# Patient Record
Sex: Female | Born: 1975 | Race: Black or African American | Hispanic: No | Smoking: Never smoker
Health system: Southern US, Community
[De-identification: ages and names within clinical notes are randomized; demographics above are authoritative.]

## PROBLEM LIST (undated history)

## (undated) DIAGNOSIS — N63 Unspecified lump in unspecified breast: Secondary | ICD-10-CM

## (undated) DIAGNOSIS — L309 Dermatitis, unspecified: Secondary | ICD-10-CM

## (undated) DIAGNOSIS — I10 Essential (primary) hypertension: Secondary | ICD-10-CM

## (undated) DIAGNOSIS — J45909 Unspecified asthma, uncomplicated: Secondary | ICD-10-CM

## (undated) HISTORY — DX: Unspecified asthma, uncomplicated: J45.909

## (undated) HISTORY — DX: Dermatitis, unspecified: L30.9

---
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
## 1999-01-06 ENCOUNTER — Other Ambulatory Visit: Admission: RE | Admit: 1999-01-06 | Discharge: 1999-01-06 | Payer: Self-pay | Admitting: Gynecology

## 1999-07-13 ENCOUNTER — Inpatient Hospital Stay (HOSPITAL_COMMUNITY): Admission: AD | Admit: 1999-07-13 | Discharge: 1999-07-16 | Payer: Self-pay | Admitting: Gynecology

## 1999-09-02 ENCOUNTER — Other Ambulatory Visit: Admission: RE | Admit: 1999-09-02 | Discharge: 1999-09-02 | Payer: Self-pay | Admitting: Gynecology

## 2000-07-05 ENCOUNTER — Encounter: Payer: Self-pay | Admitting: Internal Medicine

## 2000-07-05 ENCOUNTER — Ambulatory Visit (HOSPITAL_COMMUNITY): Admission: RE | Admit: 2000-07-05 | Discharge: 2000-07-05 | Payer: Self-pay | Admitting: Internal Medicine

## 2000-09-05 ENCOUNTER — Other Ambulatory Visit: Admission: RE | Admit: 2000-09-05 | Discharge: 2000-09-05 | Payer: Self-pay | Admitting: Gynecology

## 2001-01-24 ENCOUNTER — Encounter: Payer: Self-pay | Admitting: Internal Medicine

## 2001-01-24 ENCOUNTER — Encounter: Admission: RE | Admit: 2001-01-24 | Discharge: 2001-01-24 | Payer: Self-pay | Admitting: Internal Medicine

## 2001-07-23 ENCOUNTER — Encounter: Payer: Self-pay | Admitting: Internal Medicine

## 2001-07-23 ENCOUNTER — Encounter: Admission: RE | Admit: 2001-07-23 | Discharge: 2001-07-23 | Payer: Self-pay | Admitting: Internal Medicine

## 2001-10-01 ENCOUNTER — Other Ambulatory Visit: Admission: RE | Admit: 2001-10-01 | Discharge: 2001-10-01 | Payer: Self-pay | Admitting: Internal Medicine

## 2002-06-25 ENCOUNTER — Encounter: Payer: Self-pay | Admitting: Emergency Medicine

## 2002-06-25 ENCOUNTER — Emergency Department (HOSPITAL_COMMUNITY): Admission: EM | Admit: 2002-06-25 | Discharge: 2002-06-25 | Payer: Self-pay | Admitting: Emergency Medicine

## 2004-02-10 ENCOUNTER — Other Ambulatory Visit: Admission: RE | Admit: 2004-02-10 | Discharge: 2004-02-10 | Payer: Self-pay | Admitting: Obstetrics and Gynecology

## 2004-02-19 ENCOUNTER — Encounter: Admission: RE | Admit: 2004-02-19 | Discharge: 2004-02-19 | Payer: Self-pay | Admitting: Obstetrics and Gynecology

## 2004-04-26 ENCOUNTER — Ambulatory Visit (HOSPITAL_COMMUNITY): Admission: RE | Admit: 2004-04-26 | Discharge: 2004-04-26 | Payer: Self-pay | Admitting: Internal Medicine

## 2010-10-18 HISTORY — PX: UTERINE FIBROID SURGERY: SHX826

## 2015-08-05 ENCOUNTER — Emergency Department (HOSPITAL_COMMUNITY): Payer: Medicaid Other

## 2015-08-05 ENCOUNTER — Encounter (HOSPITAL_COMMUNITY): Payer: Self-pay | Admitting: Emergency Medicine

## 2015-08-05 ENCOUNTER — Emergency Department (HOSPITAL_COMMUNITY)
Admission: EM | Admit: 2015-08-05 | Discharge: 2015-08-06 | Disposition: A | Discharge status: Home / Self Care | Payer: Medicaid Other | Attending: Emergency Medicine | Admitting: Emergency Medicine

## 2015-08-05 DIAGNOSIS — R1012 Left upper quadrant pain: Secondary | ICD-10-CM | POA: Diagnosis not present

## 2015-08-05 DIAGNOSIS — S301XXA Contusion of abdominal wall, initial encounter: Secondary | ICD-10-CM | POA: Diagnosis not present

## 2015-08-05 DIAGNOSIS — I10 Essential (primary) hypertension: Secondary | ICD-10-CM | POA: Insufficient documentation

## 2015-08-05 DIAGNOSIS — Z79899 Other long term (current) drug therapy: Secondary | ICD-10-CM | POA: Diagnosis not present

## 2015-08-05 DIAGNOSIS — S161XXA Strain of muscle, fascia and tendon at neck level, initial encounter: Secondary | ICD-10-CM | POA: Diagnosis not present

## 2015-08-05 DIAGNOSIS — Y999 Unspecified external cause status: Secondary | ICD-10-CM | POA: Diagnosis not present

## 2015-08-05 DIAGNOSIS — S5002XA Contusion of left elbow, initial encounter: Secondary | ICD-10-CM | POA: Insufficient documentation

## 2015-08-05 DIAGNOSIS — Z91040 Latex allergy status: Secondary | ICD-10-CM | POA: Diagnosis not present

## 2015-08-05 DIAGNOSIS — S199XXA Unspecified injury of neck, initial encounter: Secondary | ICD-10-CM | POA: Diagnosis present

## 2015-08-05 DIAGNOSIS — Y939 Activity, unspecified: Secondary | ICD-10-CM | POA: Insufficient documentation

## 2015-08-05 DIAGNOSIS — Y92410 Unspecified street and highway as the place of occurrence of the external cause: Secondary | ICD-10-CM | POA: Diagnosis not present

## 2015-08-05 HISTORY — DX: Essential (primary) hypertension: I10

## 2015-08-05 LAB — I-STAT CHEM 8, ED
BUN: 4 mg/dL — ABNORMAL LOW (ref 6–20)
Calcium, Ion: 1.21 mmol/L (ref 1.13–1.30)
Chloride: 101 mmol/L (ref 101–111)
Creatinine, Ser: 0.8 mg/dL (ref 0.44–1.00)
Glucose, Bld: 104 mg/dL — ABNORMAL HIGH (ref 65–99)
HCT: 44 % (ref 36.0–46.0)
Hemoglobin: 15 g/dL (ref 12.0–15.0)
Potassium: 3.6 mmol/L (ref 3.5–5.1)
Sodium: 141 mmol/L (ref 135–145)
TCO2: 27 mmol/L (ref 0–100)

## 2015-08-05 LAB — COMPREHENSIVE METABOLIC PANEL
ALT: 22 U/L (ref 14–54)
AST: 24 U/L (ref 15–41)
Albumin: 3.8 g/dL (ref 3.5–5.0)
Alkaline Phosphatase: 48 U/L (ref 38–126)
Anion gap: 5 (ref 5–15)
BUN: 5 mg/dL — ABNORMAL LOW (ref 6–20)
CO2: 28 mmol/L (ref 22–32)
Calcium: 9.8 mg/dL (ref 8.9–10.3)
Chloride: 105 mmol/L (ref 101–111)
Creatinine, Ser: 0.83 mg/dL (ref 0.44–1.00)
GFR calc Af Amer: >60 mL/min (ref >60)
GFR calc non Af Amer: >60 mL/min (ref >60)
Glucose, Bld: 109 mg/dL — ABNORMAL HIGH (ref 65–99)
Potassium: 3.6 mmol/L (ref 3.5–5.1)
Sodium: 138 mmol/L (ref 135–145)
Total Bilirubin: 0.7 mg/dL (ref 0.3–1.2)
Total Protein: 7.2 g/dL (ref 6.5–8.1)

## 2015-08-05 LAB — CBC WITH DIFFERENTIAL/PLATELET
Basophils Absolute: 0 10*3/uL (ref 0.0–0.1)
Basophils Relative: 0 %
Eosinophils Absolute: 0.1 10*3/uL (ref 0.0–0.7)
Eosinophils Relative: 1 %
HCT: 41.7 % (ref 36.0–46.0)
Hemoglobin: 13.9 g/dL (ref 12.0–15.0)
Lymphocytes Relative: 29 %
Lymphs Abs: 2.9 10*3/uL (ref 0.7–4.0)
MCH: 28.7 pg (ref 26.0–34.0)
MCHC: 33.3 g/dL (ref 30.0–36.0)
MCV: 86 fL (ref 78.0–100.0)
Monocytes Absolute: 0.8 10*3/uL (ref 0.1–1.0)
Monocytes Relative: 8 %
Neutro Abs: 6.3 10*3/uL (ref 1.7–7.7)
Neutrophils Relative %: 62 %
Platelets: 316 10*3/uL (ref 150–400)
RBC: 4.85 MIL/uL (ref 3.87–5.11)
RDW: 12.7 % (ref 11.5–15.5)
WBC: 10.1 10*3/uL (ref 4.0–10.5)
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
## 2015-08-05 NOTE — ED Notes (Signed)
Phlebotomy at bedside.

## 2015-08-06 ENCOUNTER — Emergency Department (HOSPITAL_COMMUNITY): Payer: Medicaid Other

## 2015-08-06 ENCOUNTER — Encounter (HOSPITAL_COMMUNITY): Payer: Self-pay | Admitting: Radiology

## 2015-08-06 MED ORDER — CYCLOBENZAPRINE HCL 10 MG PO TABS: 10.0000 mg | ORAL_TABLET | Freq: Two times a day (BID) | ORAL | Status: DC | PRN

## 2015-08-06 MED ORDER — FENTANYL CITRATE (PF) 100 MCG/2ML IJ SOLN
25.0000 ug | Freq: Once | INTRAMUSCULAR | Status: AC
  Administered 2015-08-06: 25 ug via INTRAVENOUS
  Filled 2015-08-06: qty 2

## 2015-08-06 MED ORDER — IBUPROFEN 800 MG PO TABS: 800.0000 mg | ORAL_TABLET | Freq: Three times a day (TID) | ORAL | Status: DC

## 2015-08-06 MED ORDER — TRAMADOL HCL 50 MG PO TABS: 50.0000 mg | ORAL_TABLET | Freq: Two times a day (BID) | ORAL | Status: DC | PRN

## 2015-08-06 NOTE — ED Notes (Signed)
Pt ambulatory to bathroom without any problems 
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
## 2015-08-06 NOTE — ED Notes (Signed)
Patient transported to CT 
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
## 2015-08-07 MED ORDER — IOPAMIDOL (ISOVUE-300) INJECTION 61%
100.0000 mL | Freq: Once | INTRAVENOUS | Status: AC | PRN
  Administered 2015-08-06: 100 mL via INTRAVENOUS

## 2015-11-03 ENCOUNTER — Other Ambulatory Visit: Payer: Self-pay | Admitting: Nurse Practitioner

## 2015-11-03 DIAGNOSIS — Z1231 Encounter for screening mammogram for malignant neoplasm of breast: Secondary | ICD-10-CM

## 2015-12-23 ENCOUNTER — Ambulatory Visit: Payer: Medicaid Other

## 2016-07-21 ENCOUNTER — Encounter (HOSPITAL_COMMUNITY): Payer: Self-pay | Admitting: Emergency Medicine

## 2016-07-21 DIAGNOSIS — R102 Pelvic and perineal pain: Secondary | ICD-10-CM | POA: Diagnosis not present

## 2016-07-21 DIAGNOSIS — Z79899 Other long term (current) drug therapy: Secondary | ICD-10-CM | POA: Insufficient documentation

## 2016-07-21 DIAGNOSIS — I10 Essential (primary) hypertension: Secondary | ICD-10-CM | POA: Diagnosis not present

## 2016-07-21 DIAGNOSIS — R1032 Left lower quadrant pain: Secondary | ICD-10-CM | POA: Diagnosis present

## 2016-07-21 DIAGNOSIS — Z91040 Latex allergy status: Secondary | ICD-10-CM | POA: Insufficient documentation

## 2016-07-21 DIAGNOSIS — N72 Inflammatory disease of cervix uteri: Secondary | ICD-10-CM | POA: Insufficient documentation

## 2016-07-21 LAB — COMPREHENSIVE METABOLIC PANEL
ALT: 67 U/L — ABNORMAL HIGH (ref 14–54)
AST: 43 U/L — ABNORMAL HIGH (ref 15–41)
Albumin: 3.6 g/dL (ref 3.5–5.0)
Alkaline Phosphatase: 55 U/L (ref 38–126)
Anion gap: 6 (ref 5–15)
BUN: 10 mg/dL (ref 6–20)
CO2: 24 mmol/L (ref 22–32)
Calcium: 9.5 mg/dL (ref 8.9–10.3)
Chloride: 106 mmol/L (ref 101–111)
Creatinine, Ser: 0.87 mg/dL (ref 0.44–1.00)
GFR calc Af Amer: >60 mL/min (ref >60)
GFR calc non Af Amer: >60 mL/min (ref >60)
Glucose, Bld: 103 mg/dL — ABNORMAL HIGH (ref 65–99)
Potassium: 3.8 mmol/L (ref 3.5–5.1)
Sodium: 136 mmol/L (ref 135–145)
Total Bilirubin: 0.4 mg/dL (ref 0.3–1.2)
Total Protein: 6.3 g/dL — ABNORMAL LOW (ref 6.5–8.1)

## 2016-07-21 LAB — URINALYSIS, ROUTINE W REFLEX MICROSCOPIC
Bilirubin Urine: NEGATIVE
Glucose, UA: NEGATIVE mg/dL
Hgb urine dipstick: NEGATIVE
Ketones, ur: NEGATIVE mg/dL
Leukocytes, UA: NEGATIVE
Nitrite: NEGATIVE
Protein, ur: NEGATIVE mg/dL
RBC / HPF: NONE SEEN RBC/hpf (ref 0–5)
Specific Gravity, Urine: 1.014 (ref 1.005–1.030)
pH: 8 (ref 5.0–8.0)

## 2016-07-21 LAB — CBC
HCT: 39.3 % (ref 36.0–46.0)
Hemoglobin: 13 g/dL (ref 12.0–15.0)
MCH: 28.4 pg (ref 26.0–34.0)
MCHC: 33.1 g/dL (ref 30.0–36.0)
MCV: 86 fL (ref 78.0–100.0)
Platelets: 288 10*3/uL (ref 150–400)
RBC: 4.57 MIL/uL (ref 3.87–5.11)
RDW: 13.3 % (ref 11.5–15.5)
WBC: 8.9 10*3/uL (ref 4.0–10.5)

## 2016-07-21 LAB — LIPASE, BLOOD: Lipase: 31 U/L (ref 11–51)

## 2016-07-21 NOTE — ED Triage Notes (Signed)
Patient reports intermittent LLQ pain onset this evening , denies emesis or diarrhea , no fever or chills .

## 2016-07-22 ENCOUNTER — Emergency Department (HOSPITAL_COMMUNITY): Payer: Medicaid Other

## 2016-07-22 ENCOUNTER — Emergency Department (HOSPITAL_COMMUNITY)
Admission: EM | Admit: 2016-07-22 | Discharge: 2016-07-22 | Disposition: A | Discharge status: Home / Self Care | Payer: Medicaid Other | Attending: Emergency Medicine | Admitting: Emergency Medicine

## 2016-07-22 DIAGNOSIS — N72 Inflammatory disease of cervix uteri: Secondary | ICD-10-CM

## 2016-07-22 DIAGNOSIS — R102 Pelvic and perineal pain: Secondary | ICD-10-CM

## 2016-07-22 LAB — GC/CHLAMYDIA PROBE AMP (~~LOC~~) NOT AT ARMC
Chlamydia: NEGATIVE
Neisseria Gonorrhea: NEGATIVE

## 2016-07-22 LAB — WET PREP, GENITAL
Sperm: NONE SEEN
Trich, Wet Prep: NONE SEEN
Yeast Wet Prep HPF POC: NONE SEEN

## 2016-07-22 LAB — PREGNANCY, URINE: Preg Test, Ur: NEGATIVE

## 2016-07-22 MED ORDER — ONDANSETRON HCL 4 MG/2ML IJ SOLN
4.0000 mg | Freq: Once | INTRAMUSCULAR | Status: AC
  Administered 2016-07-22: 4 mg via INTRAVENOUS
  Filled 2016-07-22: qty 2

## 2016-07-22 MED ORDER — TRAMADOL HCL 50 MG PO TABS: 50.0000 mg | ORAL_TABLET | Freq: Four times a day (QID) | ORAL | 0 refills | Status: DC | PRN

## 2016-07-22 MED ORDER — LEVOFLOXACIN 500 MG PO TABS: 500.0000 mg | ORAL_TABLET | Freq: Every day | ORAL | 0 refills | Status: DC

## 2016-07-22 MED ORDER — METRONIDAZOLE 500 MG PO TABS: 500.0000 mg | ORAL_TABLET | Freq: Two times a day (BID) | ORAL | 0 refills | Status: DC

## 2016-07-22 MED ORDER — HYDROMORPHONE HCL 1 MG/ML IJ SOLN
1.0000 mg | Freq: Once | INTRAMUSCULAR | Status: AC
  Administered 2016-07-22: 1 mg via INTRAVENOUS
  Filled 2016-07-22: qty 1

## 2016-07-22 NOTE — ED Provider Notes (Signed)
[facility] DEPT Provider Note   CSN: [id number] Arrival date & time: 07/21/16  2113  By signing my name below, I, Ny'Kea Lewis, attest that this documentation has been prepared under the direction and in the presence of Blinda Turek, Gwenyth Allegra, *. Electronically Signed: Lise Auer, ED Scribe. 07/22/16. 5:30 AM.  History   Chief Complaint Chief Complaint  Patient presents with  . Abdominal Pain   The history is provided by the patient. No language interpreter was used.    HPI Comments: Tracy [name] is a 41 y.o. female with a history of hypertension, who presents to the Emergency Department complaining of sudden onset, sharp intermittent gradually worsening, left lower quadrant that began at 6:30 pm last night. She notes associated pain that radiates into the groin area. She reports she was asleep when the pain awoke her up from her sleep. Has a hx of fibroids. Pt reports a PSHx of a uterine ablation. Pt reports she does not have a menstural. She denies symptoms of nausea, emesis, diarrhea, fever, or chills.    Past Medical History:  Diagnosis Date  . Hypertension    There are no active problems to display for this patient.  Past Surgical History:  Procedure Laterality Date  . CESAREAN SECTION    . UTERINE FIBROID SURGERY     OB History    No data available     Home Medications    Prior to Admission medications   Medication Sig Start Date End Date Taking? Authorizing Provider  amLODipine (NORVASC) 10 MG tablet Take 10 mg by mouth daily.   Yes [provider]  cyclobenzaprine (FLEXERIL) 10 MG tablet Take 1 tablet (10 mg total) by mouth 2 (two) times daily as needed for muscle spasms. 08/06/15  Yes Delsa Grana, PA-C  fexofenadine (ALLEGRA) 180 MG tablet Take 180 mg by mouth daily.   Yes [provider]  hydrochlorothiazide (HYDRODIURIL) 25 MG tablet Take 25 mg by mouth daily.   Yes [provider]  ibuprofen (ADVIL,MOTRIN) 800 MG tablet Take 1  tablet (800 mg total) by mouth 3 (three) times daily. Patient taking differently: Take 800 mg by mouth 3 (three) times daily as needed for moderate pain.  08/06/15  Yes Delsa Grana, PA-C  Multiple Vitamin (MULTIVITAMIN WITH MINERALS) TABS tablet Take 1 tablet by mouth daily.   Yes [provider]  naproxen (NAPROSYN) 500 MG tablet Take 500 mg by mouth 2 (two) times daily as needed for mild pain.   Yes [provider]  levofloxacin (LEVAQUIN) 500 MG tablet Take 1 tablet (500 mg total) by mouth daily. 07/22/16   Orpah Greek, MD  metroNIDAZOLE (FLAGYL) 500 MG tablet Take 1 tablet (500 mg total) by mouth 2 (two) times daily. 07/22/16   Orpah Greek, MD  traMADol (ULTRAM) 50 MG tablet Take 1 tablet (50 mg total) by mouth every 6 (six) hours as needed. 07/22/16   Orpah Greek, MD   Family History No family history on file.  Social History Social History  Substance Use Topics  . Smoking status: Never Smoker  . Smokeless tobacco: Never Used  . Alcohol use No   Allergies   Omnicef [cefdinir]; Latex; and Penicillins  Review of Systems Review of Systems  Constitutional: Negative for chills and fever.  Gastrointestinal: Positive for abdominal pain. Negative for diarrhea, nausea and vomiting.  All other systems reviewed and are negative.  Physical Exam Updated Vital Signs BP (!) 133/97   Pulse 73   Temp  98.1 F (36.7 C) (Oral)   Resp 18   Ht 5\' 2"  (1.575 m)   Wt 72.6 kg (160 lb)   SpO2 (!) 89%   BMI 29.26 kg/m   Physical Exam  Constitutional: She is oriented to person, place, and time. She appears well-developed and well-nourished. No distress.  HENT:  Head: Normocephalic and atraumatic.  Right Ear: Hearing normal.  Left Ear: Hearing normal.  Nose: Nose normal.  Mouth/Throat: Oropharynx is clear and moist and mucous membranes are normal.  Eyes: Conjunctivae and EOM are normal. Pupils are equal, round, and reactive to light.  Neck: Normal  range of motion. Neck supple.  Cardiovascular: Regular rhythm, S1 normal and S2 normal.  Exam reveals no gallop and no friction rub.   No murmur heard. Pulmonary/Chest: Effort normal and breath sounds normal. No respiratory distress. She exhibits no tenderness.  Abdominal: Soft. Normal appearance and bowel sounds are normal. There is no hepatosplenomegaly. There is tenderness. There is no rebound, no guarding, no tenderness at McBurney's point and negative Murphy's sign. No hernia. Hernia confirmed negative in the right inguinal area and confirmed negative in the left inguinal area.  Mild LLQ tenderness.   Genitourinary: Vagina normal and uterus normal. Cervix exhibits motion tenderness. Cervix exhibits no discharge. Right adnexum displays no mass and no tenderness. Left adnexum displays tenderness. Left adnexum displays no mass.  Musculoskeletal: Normal range of motion.  Neurological: She is alert and oriented to person, place, and time. She has normal strength. No cranial nerve deficit or sensory deficit. Coordination normal. GCS eye subscore is 4. GCS verbal subscore is 5. GCS motor subscore is 6.  Skin: Skin is warm, dry and intact. No rash noted. No cyanosis.  Psychiatric: She has a normal mood and affect. Her speech is normal and behavior is normal. Thought content normal.  Nursing note and vitals reviewed.  ED Treatments / Results  DIAGNOSTIC STUDIES: Oxygen Saturation is 100% on RA, normal by my interpretation.   COORDINATION OF CARE: 12:40 AM-Discussed next steps with pt. Pt verbalized understanding and is agreeable with the plan.   Labs (all labs ordered are listed, but only abnormal results are displayed) Labs Reviewed  WET PREP, GENITAL - Abnormal; Notable for the following:       Result Value   Clue Cells Wet Prep HPF POC PRESENT (*)    WBC, Wet Prep HPF POC MANY (*)    All other components within normal limits  COMPREHENSIVE METABOLIC PANEL - Abnormal; Notable for the  following:    Glucose, Bld 103 (*)    Total Protein 6.3 (*)    AST 43 (*)    ALT 67 (*)    All other components within normal limits  URINALYSIS, ROUTINE W REFLEX MICROSCOPIC - Abnormal; Notable for the following:    Bacteria, UA RARE (*)    Squamous Epithelial / LPF 0-5 (*)    All other components within normal limits  LIPASE, BLOOD  CBC  PREGNANCY, URINE  GC/CHLAMYDIA PROBE AMP ([place]) NOT AT [facility]   EKG  EKG Interpretation None      Radiology US Transvaginal Non-ob  Result Date: 07/22/2016 CLINICAL DATA:  Left lower quadrant pain EXAM: TRANSABDOMINAL AND TRANSVAGINAL ULTRASOUND OF PELVIS DOPPLER ULTRASOUND OF OVARIES TECHNIQUE: Both transabdominal and transvaginal ultrasound examinations of the pelvis were performed. Transabdominal technique was performed for global imaging of the pelvis including uterus, ovaries, adnexal regions, and pelvic cul-de-sac. It was necessary to proceed with endovaginal exam following the transabdominal  exam to visualize the endometrium and ovaries. Color and duplex Doppler ultrasound was utilized to evaluate blood flow to the ovaries. COMPARISON:  CT abdomen pelvis 07/22/2016 FINDINGS: Uterus Measurements: 8.0 x 4.4 x 6.2 cm. There is a fibroid at the right uterine fundus measuring 1.7 x 1.6 x 2.0 cm. Endometrium Thickness: 5.6 mm.  No focal abnormality visualized. Right ovary Measurements: 3.9 x 2.1 x 2.7 cm. There is a predominantly isoechoic focus with high peripheral vascularity, measuring 1.8 x 1.4 x 1.6 cm. Left ovary Measurements: 2.5 x 2.1 x 1.8 cm. Normal appearance/no adnexal mass. Pulsed Doppler evaluation of both ovaries demonstrates normal low-resistance arterial and venous waveforms. Other findings Small amount of fluid in the cul-de-sac. IMPRESSION: 1. No ovarian torsion. 2. Heterogeneous right ovarian lesion with high peripheral flow, likely hemorrhagic cyst or corpus luteum cyst. This may be the source of the small amount of fluid in the  pelvis. 3. Right uterine fundal fibroid. Electronically Signed   By: Ulyses Jarred M.D.   On: 07/22/2016 04:35   US Pelvis Complete  Result Date: 07/22/2016 CLINICAL DATA:  Left lower quadrant pain EXAM: TRANSABDOMINAL AND TRANSVAGINAL ULTRASOUND OF PELVIS DOPPLER ULTRASOUND OF OVARIES TECHNIQUE: Both transabdominal and transvaginal ultrasound examinations of the pelvis were performed. Transabdominal technique was performed for global imaging of the pelvis including uterus, ovaries, adnexal regions, and pelvic cul-de-sac. It was necessary to proceed with endovaginal exam following the transabdominal exam to visualize the endometrium and ovaries. Color and duplex Doppler ultrasound was utilized to evaluate blood flow to the ovaries. COMPARISON:  CT abdomen pelvis 07/22/2016 FINDINGS: Uterus Measurements: 8.0 x 4.4 x 6.2 cm. There is a fibroid at the right uterine fundus measuring 1.7 x 1.6 x 2.0 cm. Endometrium Thickness: 5.6 mm.  No focal abnormality visualized. Right ovary Measurements: 3.9 x 2.1 x 2.7 cm. There is a predominantly isoechoic focus with high peripheral vascularity, measuring 1.8 x 1.4 x 1.6 cm. Left ovary Measurements: 2.5 x 2.1 x 1.8 cm. Normal appearance/no adnexal mass. Pulsed Doppler evaluation of both ovaries demonstrates normal low-resistance arterial and venous waveforms. Other findings Small amount of fluid in the cul-de-sac. IMPRESSION: 1. No ovarian torsion. 2. Heterogeneous right ovarian lesion with high peripheral flow, likely hemorrhagic cyst or corpus luteum cyst. This may be the source of the small amount of fluid in the pelvis. 3. Right uterine fundal fibroid. Electronically Signed   By: Ulyses Jarred M.D.   On: 07/22/2016 04:35   [place] Art/ven Flow Abd Pelv Doppler  Result Date: 07/22/2016 CLINICAL DATA:  Left lower quadrant pain EXAM: TRANSABDOMINAL AND TRANSVAGINAL ULTRASOUND OF PELVIS DOPPLER ULTRASOUND OF OVARIES TECHNIQUE: Both transabdominal and transvaginal ultrasound  examinations of the pelvis were performed. Transabdominal technique was performed for global imaging of the pelvis including uterus, ovaries, adnexal regions, and pelvic cul-de-sac. It was necessary to proceed with endovaginal exam following the transabdominal exam to visualize the endometrium and ovaries. Color and duplex Doppler ultrasound was utilized to evaluate blood flow to the ovaries. COMPARISON:  CT abdomen pelvis 07/22/2016 FINDINGS: Uterus Measurements: 8.0 x 4.4 x 6.2 cm. There is a fibroid at the right uterine fundus measuring 1.7 x 1.6 x 2.0 cm. Endometrium Thickness: 5.6 mm.  No focal abnormality visualized. Right ovary Measurements: 3.9 x 2.1 x 2.7 cm. There is a predominantly isoechoic focus with high peripheral vascularity, measuring 1.8 x 1.4 x 1.6 cm. Left ovary Measurements: 2.5 x 2.1 x 1.8 cm. Normal appearance/no adnexal mass. Pulsed Doppler evaluation of both ovaries demonstrates  normal low-resistance arterial and venous waveforms. Other findings Small amount of fluid in the cul-de-sac. IMPRESSION: 1. No ovarian torsion. 2. Heterogeneous right ovarian lesion with high peripheral flow, likely hemorrhagic cyst or corpus luteum cyst. This may be the source of the small amount of fluid in the pelvis. 3. Right uterine fundal fibroid. Electronically Signed   By: Ulyses Jarred M.D.   On: 07/22/2016 04:35   Ct Renal Stone Study  Result Date: 07/22/2016 CLINICAL DATA:  Left lower quadrant pain EXAM: CT ABDOMEN AND PELVIS WITHOUT CONTRAST TECHNIQUE: Multidetector CT imaging of the abdomen and pelvis was performed following the standard protocol without IV contrast. COMPARISON:  CT abdomen pelvis 08/06/2015 FINDINGS: Lower chest: No pulmonary nodules. No visible pleural or pericardial effusion. Hepatobiliary: Normal hepatic size and contours. No perihepatic ascites. No intra- or extrahepatic biliary dilatation. Multiple large gallstones without acute inflammation. Pancreas: Normal pancreatic  contours. No peripancreatic fluid collection or pancreatic ductal dilatation. Spleen: Normal. Adrenals/Urinary Tract: Normal adrenal glands. No hydronephrosis or nephrolithiasis. No abnormal perinephric stranding. No ureteral obstruction. Stomach/Bowel: There is no hiatal hernia. The stomach and duodenum are normal. There is no dilated small bowel or enteric inflammation. There is no colonic abnormality. The appendix is normal. Vascular/Lymphatic: Normal course and caliber of the major abdominal vessels. No abdominal or pelvic adenopathy. Reproductive: Uterine fundus fibroid. Musculoskeletal: No lytic or blastic osseous lesion. Normal visualized extrathoracic and extraperitoneal soft tissues. Other: No contributory non-categorized findings. IMPRESSION: 1. No acute abnormality. 2. Cholelithiasis and fibroid uterus. Electronically Signed   By: Ulyses Jarred M.D.   On: 07/22/2016 02:35    Procedures Procedures (including critical care time)  Medications Ordered in ED Medications  ondansetron (ZOFRAN) injection 4 mg (4 mg Intravenous Given 07/22/16 0124)  HYDROmorphone (DILAUDID) injection 1 mg (1 mg Intravenous Given 07/22/16 0124)    Initial Impression / Assessment and Plan / ED Course  I have reviewed the triage vital signs and the nursing notes.  Pertinent labs & imaging results that were available during my care of the patient were reviewed by me and considered in my medical decision making (see chart for details).     Patient presents to the ER for evaluation of left lower quadrant pain. Patient reports that the pain began earlier tonight. She has been having intermittent sharp stabbing pains that come and go. She has not had associated nausea, vomiting, diarrhea, constipation. Patient denies fever and chills. She has not had any urinary symptoms.  Examination revealed some tenderness in the left lower quadrant and inguinal region. No obvious peritonitis. Pelvic exam did reveal tenderness in the  area of the left adnexa without fullness or mass. She did, however, have cervical motion tenderness. The prep did reveal large amount of white blood cells. Will empirically cover her for PID. Patient has cephalosporin allergy. She will therefore be given Levaquin plus Flagyl. Follow-up with OB/GYN.  Final Clinical Impressions(s) / ED Diagnoses   Final diagnoses:  Pelvic pain  Cervicitis   New Prescriptions New Prescriptions   LEVOFLOXACIN (LEVAQUIN) 500 MG TABLET    Take 1 tablet (500 mg total) by mouth daily.   METRONIDAZOLE (FLAGYL) 500 MG TABLET    Take 1 tablet (500 mg total) by mouth 2 (two) times daily.   TRAMADOL (ULTRAM) 50 MG TABLET    Take 1 tablet (50 mg total) by mouth every 6 (six) hours as needed.  I personally performed the services described in this documentation, which was scribed in my presence. The recorded information  has been reviewed and is accurate.    Orpah Greek, MD 07/22/16 0530

## 2016-08-18 ENCOUNTER — Other Ambulatory Visit: Payer: Self-pay | Admitting: Obstetrics and Gynecology

## 2016-12-09 DIAGNOSIS — I10 Essential (primary) hypertension: Secondary | ICD-10-CM | POA: Insufficient documentation

## 2017-04-14 ENCOUNTER — Encounter: Payer: Self-pay | Admitting: Emergency Medicine

## 2017-04-14 ENCOUNTER — Other Ambulatory Visit: Payer: Self-pay

## 2017-04-14 ENCOUNTER — Ambulatory Visit (INDEPENDENT_AMBULATORY_CARE_PROVIDER_SITE_OTHER): Payer: 59 | Admitting: Emergency Medicine

## 2017-04-14 VITALS — BP 132/89 | HR 76 | Temp 99.1°F | Resp 16 | Ht 62.0 in | Wt 173.4 lb

## 2017-04-14 DIAGNOSIS — I10 Essential (primary) hypertension: Secondary | ICD-10-CM

## 2017-04-14 DIAGNOSIS — M7989 Other specified soft tissue disorders: Secondary | ICD-10-CM | POA: Diagnosis not present

## 2017-04-14 DIAGNOSIS — Z7689 Persons encountering health services in other specified circumstances: Secondary | ICD-10-CM | POA: Diagnosis not present

## 2017-04-14 MED ORDER — HYDROCHLOROTHIAZIDE 25 MG PO TABS: 25.0000 mg | ORAL_TABLET | Freq: Every day | ORAL | 1 refills | Status: DC

## 2017-04-14 NOTE — Progress Notes (Signed)
Tracy [name] 42 y.o.   Chief Complaint  Patient presents with  . Establish Care    REFILL -HCTZ  . Hypertension    DX @ AGE 42 YRS OLD    HISTORY OF PRESENT ILLNESS: This is a 42 y.o. female here to establish care.  Has a history of hypertension.  Has been on amlodipine and hydrochlorothiazide since 2012.  Complaining of intermittent swelling of her feet.  Does not urinate as frequently as she thinks she should.  No other complaints or significant symptoms.  HPI   Prior to Admission medications   Medication Sig Start Date End Date Taking? Authorizing Provider  amLODipine (NORVASC) 10 MG tablet Take 10 mg by mouth daily.   Yes [provider]  fexofenadine (ALLEGRA) 180 MG tablet Take 180 mg by mouth daily.   Yes [provider]  hydrochlorothiazide (HYDRODIURIL) 25 MG tablet Take 25 mg by mouth daily.   Yes [provider]  ibuprofen (ADVIL,MOTRIN) 800 MG tablet Take 1 tablet (800 mg total) by mouth 3 (three) times daily. Patient taking differently: Take 800 mg by mouth 3 (three) times daily as needed for moderate pain.  08/06/15  Yes Delsa Grana, PA-C  Multiple Vitamin (MULTIVITAMIN WITH MINERALS) TABS tablet Take 1 tablet by mouth daily.   Yes [provider]  cyclobenzaprine (FLEXERIL) 10 MG tablet Take 1 tablet (10 mg total) by mouth 2 (two) times daily as needed for muscle spasms. Patient not taking: Reported on 04/14/2017 08/06/15   Delsa Grana, PA-C  levofloxacin (LEVAQUIN) 500 MG tablet Take 1 tablet (500 mg total) by mouth daily. Patient not taking: Reported on 04/14/2017 07/22/16   Orpah Greek, MD  metroNIDAZOLE (FLAGYL) 500 MG tablet Take 1 tablet (500 mg total) by mouth 2 (two) times daily. Patient not taking: Reported on 04/14/2017 07/22/16   Orpah Greek, MD  naproxen (NAPROSYN) 500 MG tablet Take 500 mg by mouth 2 (two) times daily as needed for mild pain.    [provider]  traMADol (ULTRAM) 50 MG tablet  Take 1 tablet (50 mg total) by mouth every 6 (six) hours as needed. Patient not taking: Reported on 04/14/2017 07/22/16   Orpah Greek, MD    Allergies  Allergen Reactions  . Omnicef [Cefdinir] Itching  . Latex Swelling and Rash  . Penicillins Rash    Has patient had a PCN reaction causing immediate rash, facial/tongue/throat swelling, SOB or lightheadedness with hypotension: No Has patient had a PCN reaction causing severe rash involving mucus membranes or skin necrosis: No Has patient had a PCN reaction that required hospitalization NO Has patient had a PCN reaction occurring within the last 10 years: NO If all of the above answers are "NO", then may proceed with Cephalosporin use.    There are no active problems to display for this patient.   Past Medical History:  Diagnosis Date  . Hypertension     Past Surgical History:  Procedure Laterality Date  . CESAREAN SECTION    . UTERINE FIBROID SURGERY      Social History   Socioeconomic History  . Marital status: Divorced    Spouse name: Not on file  . Number of children: Not on file  . Years of education: Not on file  . Highest education level: Not on file  Occupational History  . Not on file  Social Needs  . Financial resource strain: Not on file  . Food insecurity:    Worry: Not on file  Inability: Not on file  . Transportation needs:    Medical: Not on file    Non-medical: Not on file  Tobacco Use  . Smoking status: Never Smoker  . Smokeless tobacco: Never Used  Substance and Sexual Activity  . Alcohol use: No  . Drug use: No  . Sexual activity: Yes    Birth control/protection: None  Lifestyle  . Physical activity:    Days per week: Not on file    Minutes per session: Not on file  . Stress: Not on file  Relationships  . Social connections:    Talks on phone: Not on file    Gets together: Not on file    Attends religious service: Not on file    Active member of club or organization: Not on  file    Attends meetings of clubs or organizations: Not on file    Relationship status: Not on file  . Intimate partner violence:    Fear of current or ex partner: Not on file    Emotionally abused: Not on file    Physically abused: Not on file    Forced sexual activity: Not on file  Other Topics Concern  . Not on file  Social History Narrative  . Not on file    No family history on file.   Review of Systems  Constitutional: Negative.  Negative for chills, fever, malaise/fatigue and weight loss.  HENT: Negative.  Negative for sore throat.   Eyes: Negative.  Negative for blurred vision and double vision.  Respiratory: Negative.  Negative for cough and shortness of breath.   Cardiovascular: Negative.  Negative for chest pain and palpitations.  Gastrointestinal: Negative.  Negative for abdominal pain, diarrhea, nausea and vomiting.  Genitourinary: Negative.  Negative for dysuria and hematuria.  Musculoskeletal: Negative.  Negative for back pain, myalgias and neck pain.  Skin: Negative.  Negative for rash.  Neurological: Negative.  Negative for dizziness and headaches.  Endo/Heme/Allergies: Negative.   All other systems reviewed and are negative.   Vitals:   04/14/17 1714  BP: 132/89  Pulse: 76  Resp: 16  Temp: 99.1 F (37.3 C)  SpO2: 98%    Physical Exam  Constitutional: She is oriented to person, place, and time. She appears well-developed and well-nourished.  HENT:  Head: Normocephalic and atraumatic.  Nose: Nose normal.  Mouth/Throat: Oropharynx is clear and moist. No oropharyngeal exudate.  Eyes: Pupils are equal, round, and reactive to light. Conjunctivae and EOM are normal.  Neck: Normal range of motion. Neck supple. No JVD present. No thyromegaly present.  Cardiovascular: Normal rate, regular rhythm, normal heart sounds and intact distal pulses.  Pulmonary/Chest: Effort normal and breath sounds normal. No respiratory distress.  Abdominal: Soft. Bowel sounds are  normal.  Musculoskeletal: Normal range of motion. She exhibits no edema or tenderness.  Lymphadenopathy:    She has no cervical adenopathy.  Neurological: She is alert and oriented to person, place, and time. No sensory deficit. She exhibits normal muscle tone.  Skin: Skin is warm and dry. Capillary refill takes less than 2 seconds. No rash noted.  Psychiatric: She has a normal mood and affect. Her behavior is normal.  Vitals reviewed.  A total of 30 minutes was spent in the room with the patient, greater than 50% of which was in counseling/coordination of care.   ASSESSMENT & PLAN:  Jeff was seen today for establish care and hypertension.  Diagnoses and all orders for this visit:  Essential hypertension -  hydrochlorothiazide (HYDRODIURIL) 25 MG tablet; Take 1 tablet (25 mg total) by mouth daily. -     CBC with Differential/Platelet -     Comprehensive metabolic panel -     Hemoglobin A1c -     TSH -     HIV antibody  Bilateral swelling of feet -     CBC with Differential/Platelet -     Comprehensive metabolic panel -     Hemoglobin A1c -     TSH -     HIV antibody  Encounter to establish care    Patient Instructions       IF you received an x-ray today, you will receive an invoice from [facility]. Please contact [facility] at [phone] with questions or concerns regarding your invoice.   IF you received labwork today, you will receive an invoice from [facility]. Please contact [facility] at [phone] with questions or concerns regarding your invoice.   Our billing staff will not be able to assist you with questions regarding bills from these companies.  You will be contacted with the lab results as soon as they are available. The fastest way to get your results is to activate your My Chart account. Instructions are located on the last page of this paperwork. If you have not heard from [place] regarding the results in 2 weeks, please contact this  office.     Hypertension Hypertension, commonly called high blood pressure, is when the force of blood pumping through the arteries is too strong. The arteries are the blood vessels that carry blood from the heart throughout the body. Hypertension forces the heart to work harder to pump blood and may cause arteries to become narrow or stiff. Having untreated or uncontrolled hypertension can cause heart attacks, strokes, kidney disease, and other problems. A blood pressure reading consists of a higher number over a lower number. Ideally, your blood pressure should be below 120/80. The first ("top") number is called the systolic pressure. It is a measure of the pressure in your arteries as your heart beats. The second ("bottom") number is called the diastolic pressure. It is a measure of the pressure in your arteries as the heart relaxes. What are the causes? The cause of this condition is not known. What increases the risk? Some risk factors for high blood pressure are under your control. Others are not. Factors you can change  Smoking.  Having type 2 diabetes mellitus, high cholesterol, or both.  Not getting enough exercise or physical activity.  Being overweight.  Having too much fat, sugar, calories, or salt (sodium) in your diet.  Drinking too much alcohol. Factors that are difficult or impossible to change  Having chronic kidney disease.  Having a family history of high blood pressure.  Age. Risk increases with age.  Race. You may be at higher risk if you are African-American.  Gender. Men are at higher risk than women before age 66. After age 66, women are at higher risk than men.  Having obstructive sleep apnea.  Stress. What are the signs or symptoms? Extremely high blood pressure (hypertensive crisis) may cause:  Headache.  Anxiety.  Shortness of breath.  Nosebleed.  Nausea and vomiting.  Severe chest pain.  Jerky movements you cannot control  (seizures).  How is this diagnosed? This condition is diagnosed by measuring your blood pressure while you are seated, with your arm resting on a surface. The cuff of the blood pressure monitor will be placed directly against the skin of  your upper arm at the level of your heart. It should be measured at least twice using the same arm. Certain conditions can cause a difference in blood pressure between your right and left arms. Certain factors can cause blood pressure readings to be lower or higher than normal (elevated) for a short period of time:  When your blood pressure is higher when you are in a health care provider's office than when you are at home, this is called white coat hypertension. Most people with this condition do not need medicines.  When your blood pressure is higher at home than when you are in a health care provider's office, this is called masked hypertension. Most people with this condition may need medicines to control blood pressure.  If you have a high blood pressure reading during one visit or you have normal blood pressure with other risk factors:  You may be asked to return on a different day to have your blood pressure checked again.  You may be asked to monitor your blood pressure at home for 1 week or longer.  If you are diagnosed with hypertension, you may have other blood or imaging tests to help your health care provider understand your overall risk for other conditions. How is this treated? This condition is treated by making healthy lifestyle changes, such as eating healthy foods, exercising more, and reducing your alcohol intake. Your health care provider may prescribe medicine if lifestyle changes are not enough to get your blood pressure under control, and if:  Your systolic blood pressure is above 130.  Your diastolic blood pressure is above 80.  Your personal target blood pressure may vary depending on your medical conditions, your age, and other  factors. Follow these instructions at home: Eating and drinking  Eat a diet that is high in fiber and potassium, and low in sodium, added sugar, and fat. An example eating plan is called the DASH (Dietary Approaches to Stop Hypertension) diet. To eat this way: ? Eat plenty of fresh fruits and vegetables. Try to fill half of your plate at each meal with fruits and vegetables. ? Eat whole grains, such as whole wheat pasta, brown rice, or whole grain bread. Fill about one quarter of your plate with whole grains. ? Eat or drink low-fat dairy products, such as skim milk or low-fat yogurt. ? Avoid fatty cuts of meat, processed or cured meats, and poultry with skin. Fill about one quarter of your plate with lean proteins, such as fish, chicken without skin, beans, eggs, and tofu. ? Avoid premade and processed foods. These tend to be higher in sodium, added sugar, and fat.  Reduce your daily sodium intake. Most people with hypertension should eat less than 1,500 mg of sodium a day.  Limit alcohol intake to no more than 1 drink a day for nonpregnant women and 2 drinks a day for men. One drink equals 12 oz of beer, 5 oz of wine, or 1 oz of hard liquor. Lifestyle  Work with your health care provider to maintain a healthy body weight or to lose weight. Ask what an ideal weight is for you.  Get at least 30 minutes of exercise that causes your heart to beat faster (aerobic exercise) most days of the week. Activities may include walking, swimming, or biking.  Include exercise to strengthen your muscles (resistance exercise), such as pilates or lifting weights, as part of your weekly exercise routine. Try to do these types of exercises for 30 minutes at  least 3 days a week.  Do not use any products that contain nicotine or tobacco, such as cigarettes and e-cigarettes. If you need help quitting, ask your health care provider.  Monitor your blood pressure at home as told by your health care provider.  Keep  all follow-up visits as told by your health care provider. This is important. Medicines  Take over-the-counter and prescription medicines only as told by your health care provider. Follow directions carefully. Blood pressure medicines must be taken as prescribed.  Do not skip doses of blood pressure medicine. Doing this puts you at risk for problems and can make the medicine less effective.  Ask your health care provider about side effects or reactions to medicines that you should watch for. Contact a health care provider if:  You think you are having a reaction to a medicine you are taking.  You have headaches that keep coming back (recurring).  You feel dizzy.  You have swelling in your ankles.  You have trouble with your vision. Get help right away if:  You develop a severe headache or confusion.  You have unusual weakness or numbness.  You feel faint.  You have severe pain in your chest or abdomen.  You vomit repeatedly.  You have trouble breathing. Summary  Hypertension is when the force of blood pumping through your arteries is too strong. If this condition is not controlled, it may put you at risk for serious complications.  Your personal target blood pressure may vary depending on your medical conditions, your age, and other factors. For most people, a normal blood pressure is less than 120/80.  Hypertension is treated with lifestyle changes, medicines, or a combination of both. Lifestyle changes include weight loss, eating a healthy, low-sodium diet, exercising more, and limiting alcohol. This information is not intended to replace advice given to you by your health care provider. Make sure you discuss any questions you have with your health care provider. Document Released: 01/03/2005 Document Revised: 12/02/2015 Document Reviewed: 12/02/2015 Elsevier Interactive Patient Education  2018 [employer] Inc.     Agustina Caroli, MD [facility]

## 2017-04-14 NOTE — Patient Instructions (Addendum)
   IF you received an x-ray today, you will receive an invoice from [facility]. Please contact [facility] at [phone] with questions or concerns regarding your invoice.   IF you received labwork today, you will receive an invoice from [facility]. Please contact [facility] at [phone] with questions or concerns regarding your invoice.   Our billing staff will not be able to assist you with questions regarding bills from these companies.  You will be contacted with the lab results as soon as they are available. The fastest way to get your results is to activate your My Chart account. Instructions are located on the last page of this paperwork. If you have not heard from us regarding the results in 2 weeks, please contact this office.     Hypertension Hypertension, commonly called high blood pressure, is when the force of blood pumping through the arteries is too strong. The arteries are the blood vessels that carry blood from the heart throughout the body. Hypertension forces the heart to work harder to pump blood and may cause arteries to become narrow or stiff. Having untreated or uncontrolled hypertension can cause heart attacks, strokes, kidney disease, and other problems. A blood pressure reading consists of a higher number over a lower number. Ideally, your blood pressure should be below 120/80. The first ("top") number is called the systolic pressure. It is a measure of the pressure in your arteries as your heart beats. The second ("bottom") number is called the diastolic pressure. It is a measure of the pressure in your arteries as the heart relaxes. What are the causes? The cause of this condition is not known. What increases the risk? Some risk factors for high blood pressure are under your control. Others are not. Factors you can change  Smoking.  Having type 2 diabetes mellitus, high cholesterol, or both.  Not getting enough exercise or physical  activity.  Being overweight.  Having too much fat, sugar, calories, or salt (sodium) in your diet.  Drinking too much alcohol. Factors that are difficult or impossible to change  Having chronic kidney disease.  Having a family history of high blood pressure.  Age. Risk increases with age.  Race. You may be at higher risk if you are African-American.  Gender. Men are at higher risk than women before age 45. After age 65, women are at higher risk than men.  Having obstructive sleep apnea.  Stress. What are the signs or symptoms? Extremely high blood pressure (hypertensive crisis) may cause:  Headache.  Anxiety.  Shortness of breath.  Nosebleed.  Nausea and vomiting.  Severe chest pain.  Jerky movements you cannot control (seizures).  How is this diagnosed? This condition is diagnosed by measuring your blood pressure while you are seated, with your arm resting on a surface. The cuff of the blood pressure monitor will be placed directly against the skin of your upper arm at the level of your heart. It should be measured at least twice using the same arm. Certain conditions can cause a difference in blood pressure between your right and left arms. Certain factors can cause blood pressure readings to be lower or higher than normal (elevated) for a short period of time:  When your blood pressure is higher when you are in a health care provider's office than when you are at home, this is called white coat hypertension. Most people with this condition do not need medicines.  When your blood pressure is higher at home than when you   are in a health care provider's office, this is called masked hypertension. Most people with this condition may need medicines to control blood pressure.  If you have a high blood pressure reading during one visit or you have normal blood pressure with other risk factors:  You may be asked to return on a different day to have your blood pressure  checked again.  You may be asked to monitor your blood pressure at home for 1 week or longer.  If you are diagnosed with hypertension, you may have other blood or imaging tests to help your health care provider understand your overall risk for other conditions. How is this treated? This condition is treated by making healthy lifestyle changes, such as eating healthy foods, exercising more, and reducing your alcohol intake. Your health care provider may prescribe medicine if lifestyle changes are not enough to get your blood pressure under control, and if:  Your systolic blood pressure is above 130.  Your diastolic blood pressure is above 80.  Your personal target blood pressure may vary depending on your medical conditions, your age, and other factors. Follow these instructions at home: Eating and drinking  Eat a diet that is high in fiber and potassium, and low in sodium, added sugar, and fat. An example eating plan is called the DASH (Dietary Approaches to Stop Hypertension) diet. To eat this way: ? Eat plenty of fresh fruits and vegetables. Try to fill half of your plate at each meal with fruits and vegetables. ? Eat whole grains, such as whole wheat pasta, brown rice, or whole grain bread. Fill about one quarter of your plate with whole grains. ? Eat or drink low-fat dairy products, such as skim milk or low-fat yogurt. ? Avoid fatty cuts of meat, processed or cured meats, and poultry with skin. Fill about one quarter of your plate with lean proteins, such as fish, chicken without skin, beans, eggs, and tofu. ? Avoid premade and processed foods. These tend to be higher in sodium, added sugar, and fat.  Reduce your daily sodium intake. Most people with hypertension should eat less than 1,500 mg of sodium a day.  Limit alcohol intake to no more than 1 drink a day for nonpregnant women and 2 drinks a day for men. One drink equals 12 oz of beer, 5 oz of wine, or 1 oz of hard  liquor. Lifestyle  Work with your health care provider to maintain a healthy body weight or to lose weight. Ask what an ideal weight is for you.  Get at least 30 minutes of exercise that causes your heart to beat faster (aerobic exercise) most days of the week. Activities may include walking, swimming, or biking.  Include exercise to strengthen your muscles (resistance exercise), such as pilates or lifting weights, as part of your weekly exercise routine. Try to do these types of exercises for 30 minutes at least 3 days a week.  Do not use any products that contain nicotine or tobacco, such as cigarettes and e-cigarettes. If you need help quitting, ask your health care provider.  Monitor your blood pressure at home as told by your health care provider.  Keep all follow-up visits as told by your health care provider. This is important. Medicines  Take over-the-counter and prescription medicines only as told by your health care provider. Follow directions carefully. Blood pressure medicines must be taken as prescribed.  Do not skip doses of blood pressure medicine. Doing this puts you at risk for problems and   can make the medicine less effective.  Ask your health care provider about side effects or reactions to medicines that you should watch for. Contact a health care provider if:  You think you are having a reaction to a medicine you are taking.  You have headaches that keep coming back (recurring).  You feel dizzy.  You have swelling in your ankles.  You have trouble with your vision. Get help right away if:  You develop a severe headache or confusion.  You have unusual weakness or numbness.  You feel faint.  You have severe pain in your chest or abdomen.  You vomit repeatedly.  You have trouble breathing. Summary  Hypertension is when the force of blood pumping through your arteries is too strong. If this condition is not controlled, it may put you at risk for serious  complications.  Your personal target blood pressure may vary depending on your medical conditions, your age, and other factors. For most people, a normal blood pressure is less than 120/80.  Hypertension is treated with lifestyle changes, medicines, or a combination of both. Lifestyle changes include weight loss, eating a healthy, low-sodium diet, exercising more, and limiting alcohol. This information is not intended to replace advice given to you by your health care provider. Make sure you discuss any questions you have with your health care provider. Document Released: 01/03/2005 Document Revised: 12/02/2015 Document Reviewed: 12/02/2015 Elsevier Interactive Patient Education  2018 [employer] Inc.  

## 2017-04-15 LAB — HEMOGLOBIN A1C
Est. average glucose Bld gHb Est-mCnc: 131 mg/dL
Hgb A1c MFr Bld: 6.2 % — ABNORMAL HIGH (ref 4.8–5.6)

## 2017-04-15 LAB — COMPREHENSIVE METABOLIC PANEL
ALT: 37 IU/L — ABNORMAL HIGH (ref 0–32)
AST: 33 IU/L (ref 0–40)
Albumin/Globulin Ratio: 1.7 (ref 1.2–2.2)
Albumin: 4.4 g/dL (ref 3.5–5.5)
Alkaline Phosphatase: 73 IU/L (ref 39–117)
BUN/Creatinine Ratio: 14 (ref 9–23)
BUN: 14 mg/dL (ref 6–24)
Bilirubin Total: 0.2 mg/dL (ref 0.0–1.2)
CO2: 24 mmol/L (ref 20–29)
Calcium: 9.8 mg/dL (ref 8.7–10.2)
Chloride: 102 mmol/L (ref 96–106)
Creatinine, Ser: 1.03 mg/dL — ABNORMAL HIGH (ref 0.57–1.00)
GFR calc Af Amer: 77 mL/min/{1.73_m2} (ref >59)
GFR calc non Af Amer: 67 mL/min/{1.73_m2} (ref >59)
Globulin, Total: 2.6 g/dL (ref 1.5–4.5)
Glucose: 90 mg/dL (ref 65–99)
Potassium: 3.5 mmol/L (ref 3.5–5.2)
Sodium: 138 mmol/L (ref 134–144)
Total Protein: 7 g/dL (ref 6.0–8.5)

## 2017-04-15 LAB — CBC WITH DIFFERENTIAL/PLATELET
Basophils Absolute: 0 10*3/uL (ref 0.0–0.2)
Basos: 0 %
EOS (ABSOLUTE): 0.1 10*3/uL (ref 0.0–0.4)
Eos: 2 %
Hematocrit: 40.5 % (ref 34.0–46.6)
Hemoglobin: 13.5 g/dL (ref 11.1–15.9)
Immature Grans (Abs): 0 10*3/uL (ref 0.0–0.1)
Immature Granulocytes: 0 %
Lymphocytes Absolute: 2.6 10*3/uL (ref 0.7–3.1)
Lymphs: 35 %
MCH: 27.8 pg (ref 26.6–33.0)
MCHC: 33.3 g/dL (ref 31.5–35.7)
MCV: 84 fL (ref 79–97)
Monocytes Absolute: 0.6 10*3/uL (ref 0.1–0.9)
Monocytes: 9 %
Neutrophils Absolute: 4 10*3/uL (ref 1.4–7.0)
Neutrophils: 54 %
Platelets: 340 10*3/uL (ref 150–379)
RBC: 4.85 x10E6/uL (ref 3.77–5.28)
RDW: 14.8 % (ref 12.3–15.4)
WBC: 7.4 10*3/uL (ref 3.4–10.8)

## 2017-04-15 LAB — TSH: TSH: 1.05 u[IU]/mL (ref 0.450–4.500)

## 2017-04-15 LAB — HIV ANTIBODY (ROUTINE TESTING W REFLEX): HIV Screen 4th Generation wRfx: NONREACTIVE

## 2017-04-18 ENCOUNTER — Encounter: Payer: Self-pay | Admitting: *Deleted

## 2017-05-12 ENCOUNTER — Encounter: Payer: Self-pay | Admitting: Emergency Medicine

## 2017-05-12 ENCOUNTER — Ambulatory Visit (INDEPENDENT_AMBULATORY_CARE_PROVIDER_SITE_OTHER): Payer: 59 | Admitting: Emergency Medicine

## 2017-05-12 ENCOUNTER — Other Ambulatory Visit: Payer: Self-pay

## 2017-05-12 VITALS — BP 130/88 | HR 74 | Temp 98.8°F | Resp 16 | Ht 62.0 in | Wt 175.4 lb

## 2017-05-12 DIAGNOSIS — I10 Essential (primary) hypertension: Secondary | ICD-10-CM | POA: Insufficient documentation

## 2017-05-12 MED ORDER — LISINOPRIL-HYDROCHLOROTHIAZIDE 20-12.5 MG PO TABS
1.0000 | ORAL_TABLET | Freq: Every day | ORAL | 3 refills | Status: DC
Start: 2017-05-12 — End: 2018-06-21

## 2017-05-12 NOTE — Patient Instructions (Addendum)
IF you received an x-ray today, you will receive an invoice from [facility]. Please contact [facility] at [phone] with questions or concerns regarding your invoice.   IF you received labwork today, you will receive an invoice from [place]. Please contact [facility] at [phone] with questions or concerns regarding your invoice.   Our billing staff will not be able to assist you with questions regarding bills from these companies.  You will be contacted with the lab results as soon as they are available. The fastest way to get your results is to activate your My Chart account. Instructions are located on the last page of this paperwork. If you have not heard from [place] regarding the results in 2 weeks, please contact this office.     DASH Eating Plan DASH stands for "Dietary Approaches to Stop Hypertension." The DASH eating plan is a healthy eating plan that has been shown to reduce high blood pressure (hypertension). It may also reduce your risk for type 2 diabetes, heart disease, and stroke. The DASH eating plan may also help with weight loss. What are tips for following this plan? General guidelines  Avoid eating more than 2,300 mg (milligrams) of salt (sodium) a day. If you have hypertension, you may need to reduce your sodium intake to 1,500 mg a day.  Limit alcohol intake to no more than 1 drink a day for nonpregnant women and 2 drinks a day for men. One drink equals 12 oz of beer, 5 oz of wine, or 1 oz of hard liquor.  Work with your health care provider to maintain a healthy body weight or to lose weight. Ask what an ideal weight is for you.  Get at least 30 minutes of exercise that causes your heart to beat faster (aerobic exercise) most days of the week. Activities may include walking, swimming, or biking.  Work with your health care provider or diet and nutrition specialist (dietitian) to adjust your eating plan to your individual calorie  needs. Reading food labels  Check food labels for the amount of sodium per serving. Choose foods with less than 5 percent of the Daily Value of sodium. Generally, foods with less than 300 mg of sodium per serving fit into this eating plan.  To find whole grains, look for the word "whole" as the first word in the ingredient list. Shopping  Buy products labeled as "low-sodium" or "no salt added."  Buy fresh foods. Avoid canned foods and premade or frozen meals. Cooking  Avoid adding salt when cooking. Use salt-free seasonings or herbs instead of table salt or sea salt. Check with your health care provider or pharmacist before using salt substitutes.  Do not fry foods. Cook foods using healthy methods such as baking, boiling, grilling, and broiling instead.  Cook with heart-healthy oils, such as olive, canola, soybean, or sunflower oil. Meal planning   Eat a balanced diet that includes: ? 5 or more servings of fruits and vegetables each day. At each meal, try to fill half of your plate with fruits and vegetables. ? Up to 6-8 servings of whole grains each day. ? Less than 6 oz of lean meat, poultry, or fish each day. A 3-oz serving of meat is about the same size as a deck of cards. One egg equals 1 oz. ? 2 servings of low-fat dairy each day. ? A serving of nuts, seeds, or beans 5 times each week. ? Heart-healthy fats. Healthy fats called Omega-3 fatty acids are found  in foods such as flaxseeds and coldwater fish, like sardines, salmon, and mackerel.  Limit how much you eat of the following: ? Canned or prepackaged foods. ? Food that is high in trans fat, such as fried foods. ? Food that is high in saturated fat, such as fatty meat. ? Sweets, desserts, sugary drinks, and other foods with added sugar. ? Full-fat dairy products.  Do not salt foods before eating.  Try to eat at least 2 vegetarian meals each week.  Eat more home-cooked food and less restaurant, buffet, and fast  food.  When eating at a restaurant, ask that your food be prepared with less salt or no salt, if possible. What foods are recommended? The items listed may not be a complete list. Talk with your dietitian about what dietary choices are best for you. Grains Whole-grain or whole-wheat bread. Whole-grain or whole-wheat pasta. Brown rice. [place]. Bulgur. Whole-grain and low-sodium cereals. Pita bread. Low-fat, low-sodium crackers. Whole-wheat flour tortillas. Vegetables Fresh or frozen vegetables (raw, steamed, roasted, or grilled). Low-sodium or reduced-sodium tomato and vegetable juice. Low-sodium or reduced-sodium tomato sauce and tomato paste. Low-sodium or reduced-sodium canned vegetables. Fruits All fresh, dried, or frozen fruit. Canned fruit in natural juice (without added sugar). Meat and other protein foods Skinless chicken or [place]. Ground chicken or [place]. Pork with fat trimmed off. Fish and seafood. Egg whites. Dried beans, peas, or lentils. Unsalted nuts, nut butters, and seeds. Unsalted canned beans. Lean cuts of beef with fat trimmed off. Low-sodium, lean deli meat. Dairy Low-fat (1%) or fat-free (skim) milk. Fat-free, low-fat, or reduced-fat cheeses. Nonfat, low-sodium ricotta or cottage cheese. Low-fat or nonfat yogurt. Low-fat, low-sodium cheese. Fats and oils Soft margarine without trans fats. Vegetable oil. Low-fat, reduced-fat, or light mayonnaise and salad dressings (reduced-sodium). Canola, safflower, olive, soybean, and sunflower oils. Avocado. Seasoning and other foods Herbs. Spices. Seasoning mixes without salt. Unsalted popcorn and pretzels. Fat-free sweets. What foods are not recommended? The items listed may not be a complete list. Talk with your dietitian about what dietary choices are best for you. Grains Baked goods made with fat, such as croissants, muffins, or some breads. Dry pasta or rice meal packs. Vegetables Creamed or fried vegetables. Vegetables  in a cheese sauce. Regular canned vegetables (not low-sodium or reduced-sodium). Regular canned tomato sauce and paste (not low-sodium or reduced-sodium). Regular tomato and vegetable juice (not low-sodium or reduced-sodium). Angie Fava. Olives. Fruits Canned fruit in a light or heavy syrup. Fried fruit. Fruit in cream or butter sauce. Meat and other protein foods Fatty cuts of meat. Ribs. Fried meat. Berniece Salines. Sausage. Bologna and other processed lunch meats. Salami. Fatback. Hotdogs. Bratwurst. Salted nuts and seeds. Canned beans with added salt. Canned or smoked fish. Whole eggs or egg yolks. Chicken or [place] with skin. Dairy Whole or 2% milk, cream, and half-and-half. Whole or full-fat cream cheese. Whole-fat or sweetened yogurt. Full-fat cheese. Nondairy creamers. Whipped toppings. Processed cheese and cheese spreads. Fats and oils Butter. Stick margarine. Lard. Shortening. Ghee. Bacon fat. Tropical oils, such as coconut, palm kernel, or palm oil. Seasoning and other foods Salted popcorn and pretzels. Onion salt, garlic salt, seasoned salt, table salt, and sea salt. Worcestershire sauce. Tartar sauce. Barbecue sauce. Teriyaki sauce. Soy sauce, including reduced-sodium. Steak sauce. Canned and packaged gravies. Fish sauce. Oyster sauce. Cocktail sauce. Horseradish that you find on the shelf. Ketchup. Mustard. Meat flavorings and tenderizers. Bouillon cubes. Hot sauce and Tabasco sauce. Premade or packaged marinades. Premade or packaged taco seasonings. Relishes.  Regular salad dressings. Where to find more information:  National Heart, Lung, and [place]: https://wilson-eaton.com/  American Heart Association: www.heart.org Summary  The DASH eating plan is a healthy eating plan that has been shown to reduce high blood pressure (hypertension). It may also reduce your risk for type 2 diabetes, heart disease, and stroke.  With the DASH eating plan, you should limit salt (sodium) intake to 2,300 mg a  day. If you have hypertension, you may need to reduce your sodium intake to 1,500 mg a day.  When on the DASH eating plan, aim to eat more fresh fruits and vegetables, whole grains, lean proteins, low-fat dairy, and heart-healthy fats.  Work with your health care provider or diet and nutrition specialist (dietitian) to adjust your eating plan to your individual calorie needs. This information is not intended to replace advice given to you by your health care provider. Make sure you discuss any questions you have with your health care provider. Document Released: 12/23/2010 Document Revised: 12/28/2015 Document Reviewed: 12/28/2015 Elsevier Interactive Patient Education  2018 [employer].  Hypertension Hypertension, commonly called high blood pressure, is when the force of blood pumping through the arteries is too strong. The arteries are the blood vessels that carry blood from the heart throughout the body. Hypertension forces the heart to work harder to pump blood and may cause arteries to become narrow or stiff. Having untreated or uncontrolled hypertension can cause heart attacks, strokes, kidney disease, and other problems. A blood pressure reading consists of a higher number over a lower number. Ideally, your blood pressure should be below 120/80. The first ("top") number is called the systolic pressure. It is a measure of the pressure in your arteries as your heart beats. The second ("bottom") number is called the diastolic pressure. It is a measure of the pressure in your arteries as the heart relaxes. What are the causes? The cause of this condition is not known. What increases the risk? Some risk factors for high blood pressure are under your control. Others are not. Factors you can change  Smoking.  Having type 2 diabetes mellitus, high cholesterol, or both.  Not getting enough exercise or physical activity.  Being overweight.  Having too much fat, sugar, calories, or salt  (sodium) in your diet.  Drinking too much alcohol. Factors that are difficult or impossible to change  Having chronic kidney disease.  Having a family history of high blood pressure.  Age. Risk increases with age.  Race. You may be at higher risk if you are African-American.  Gender. Men are at higher risk than women before age 51. After age 33, women are at higher risk than men.  Having obstructive sleep apnea.  Stress. What are the signs or symptoms? Extremely high blood pressure (hypertensive crisis) may cause:  Headache.  Anxiety.  Shortness of breath.  Nosebleed.  Nausea and vomiting.  Severe chest pain.  Jerky movements you cannot control (seizures).  How is this diagnosed? This condition is diagnosed by measuring your blood pressure while you are seated, with your arm resting on a surface. The cuff of the blood pressure monitor will be placed directly against the skin of your upper arm at the level of your heart. It should be measured at least twice using the same arm. Certain conditions can cause a difference in blood pressure between your right and left arms. Certain factors can cause blood pressure readings to be lower or higher than normal (elevated) for a short period of  time:  When your blood pressure is higher when you are in a health care provider's office than when you are at home, this is called white coat hypertension. Most people with this condition do not need medicines.  When your blood pressure is higher at home than when you are in a health care provider's office, this is called masked hypertension. Most people with this condition may need medicines to control blood pressure.  If you have a high blood pressure reading during one visit or you have normal blood pressure with other risk factors:  You may be asked to return on a different day to have your blood pressure checked again.  You may be asked to monitor your blood pressure at home for 1 week  or longer.  If you are diagnosed with hypertension, you may have other blood or imaging tests to help your health care provider understand your overall risk for other conditions. How is this treated? This condition is treated by making healthy lifestyle changes, such as eating healthy foods, exercising more, and reducing your alcohol intake. Your health care provider may prescribe medicine if lifestyle changes are not enough to get your blood pressure under control, and if:  Your systolic blood pressure is above 130.  Your diastolic blood pressure is above 80.  Your personal target blood pressure may vary depending on your medical conditions, your age, and other factors. Follow these instructions at home: Eating and drinking  Eat a diet that is high in fiber and potassium, and low in sodium, added sugar, and fat. An example eating plan is called the DASH (Dietary Approaches to Stop Hypertension) diet. To eat this way: ? Eat plenty of fresh fruits and vegetables. Try to fill half of your plate at each meal with fruits and vegetables. ? Eat whole grains, such as whole wheat pasta, brown rice, or whole grain bread. Fill about one quarter of your plate with whole grains. ? Eat or drink low-fat dairy products, such as skim milk or low-fat yogurt. ? Avoid fatty cuts of meat, processed or cured meats, and poultry with skin. Fill about one quarter of your plate with lean proteins, such as fish, chicken without skin, beans, eggs, and tofu. ? Avoid premade and processed foods. These tend to be higher in sodium, added sugar, and fat.  Reduce your daily sodium intake. Most people with hypertension should eat less than 1,500 mg of sodium a day.  Limit alcohol intake to no more than 1 drink a day for nonpregnant women and 2 drinks a day for men. One drink equals 12 oz of beer, 5 oz of wine, or 1 oz of hard liquor. Lifestyle  Work with your health care provider to maintain a healthy body weight or to  lose weight. Ask what an ideal weight is for you.  Get at least 30 minutes of exercise that causes your heart to beat faster (aerobic exercise) most days of the week. Activities may include walking, swimming, or biking.  Include exercise to strengthen your muscles (resistance exercise), such as pilates or lifting weights, as part of your weekly exercise routine. Try to do these types of exercises for 30 minutes at least 3 days a week.  Do not use any products that contain nicotine or tobacco, such as cigarettes and e-cigarettes. If you need help quitting, ask your health care provider.  Monitor your blood pressure at home as told by your health care provider.  Keep all follow-up visits as told by your  health care provider. This is important. Medicines  Take over-the-counter and prescription medicines only as told by your health care provider. Follow directions carefully. Blood pressure medicines must be taken as prescribed.  Do not skip doses of blood pressure medicine. Doing this puts you at risk for problems and can make the medicine less effective.  Ask your health care provider about side effects or reactions to medicines that you should watch for. Contact a health care provider if:  You think you are having a reaction to a medicine you are taking.  You have headaches that keep coming back (recurring).  You feel dizzy.  You have swelling in your ankles.  You have trouble with your vision. Get help right away if:  You develop a severe headache or confusion.  You have unusual weakness or numbness.  You feel faint.  You have severe pain in your chest or abdomen.  You vomit repeatedly.  You have trouble breathing. Summary  Hypertension is when the force of blood pumping through your arteries is too strong. If this condition is not controlled, it may put you at risk for serious complications.  Your personal target blood pressure may vary depending on your medical  conditions, your age, and other factors. For most people, a normal blood pressure is less than 120/80.  Hypertension is treated with lifestyle changes, medicines, or a combination of both. Lifestyle changes include weight loss, eating a healthy, low-sodium diet, exercising more, and limiting alcohol. This information is not intended to replace advice given to you by your health care provider. Make sure you discuss any questions you have with your health care provider. Document Released: 01/03/2005 Document Revised: 12/02/2015 Document Reviewed: 12/02/2015 Elsevier Interactive Patient Education  [employer].

## 2017-05-12 NOTE — Progress Notes (Signed)
Tracy [name] 42 y.o.   Chief Complaint  Patient presents with  . Hypertension    medication review on Amlodipine and hctz    HISTORY OF PRESENT ILLNESS: This is a 42 y.o. female with history of hypertension here for follow-up.  Systolic blood pressure at home has been 130- 140 range.  Diastolics have been 80 to 90s range.  Asymptomatic.  Ankle swelling still there but less than before. Seen last by me 04/14/2017.  Blood work unremarkable but hemoglobin A1c at prediabetic level.  Labs reviewed with patient today.  Recommended to treat with diet. HPI   Prior to Admission medications   Medication Sig Start Date End Date Taking? Authorizing Provider  amLODipine (NORVASC) 10 MG tablet Take 10 mg by mouth daily.   Yes [provider]  fexofenadine (ALLEGRA) 180 MG tablet Take 180 mg by mouth daily.   Yes [provider]  hydrochlorothiazide (HYDRODIURIL) 25 MG tablet Take 1 tablet (25 mg total) by mouth daily. 04/14/17 07/13/17 Yes Emorie Mcfate, Ines Bloomer, MD  ibuprofen (ADVIL,MOTRIN) 800 MG tablet Take 1 tablet (800 mg total) by mouth 3 (three) times daily. Patient taking differently: Take 800 mg by mouth 3 (three) times daily as needed for moderate pain.  08/06/15  Yes Delsa Grana, PA-C  Multiple Vitamin (MULTIVITAMIN WITH MINERALS) TABS tablet Take 1 tablet by mouth daily.   Yes [provider]  cyclobenzaprine (FLEXERIL) 10 MG tablet Take 1 tablet (10 mg total) by mouth 2 (two) times daily as needed for muscle spasms. Patient not taking: Reported on 04/14/2017 08/06/15   Delsa Grana, PA-C  levofloxacin (LEVAQUIN) 500 MG tablet Take 1 tablet (500 mg total) by mouth daily. Patient not taking: Reported on 04/14/2017 07/22/16   Orpah Greek, MD  metroNIDAZOLE (FLAGYL) 500 MG tablet Take 1 tablet (500 mg total) by mouth 2 (two) times daily. Patient not taking: Reported on 04/14/2017 07/22/16   Orpah Greek, MD  naproxen (NAPROSYN) 500 MG tablet Take 500 mg  by mouth 2 (two) times daily as needed for mild pain.    [provider]  traMADol (ULTRAM) 50 MG tablet Take 1 tablet (50 mg total) by mouth every 6 (six) hours as needed. Patient not taking: Reported on 04/14/2017 07/22/16   Orpah Greek, MD    Allergies  Allergen Reactions  . Omnicef [Cefdinir] Itching  . Latex Swelling and Rash  . Penicillins Rash    Has patient had a PCN reaction causing immediate rash, facial/tongue/throat swelling, SOB or lightheadedness with hypotension: No Has patient had a PCN reaction causing severe rash involving mucus membranes or skin necrosis: No Has patient had a PCN reaction that required hospitalization NO Has patient had a PCN reaction occurring within the last 10 years: NO If all of the above answers are "NO", then may proceed with Cephalosporin use.    There are no active problems to display for this patient.   Past Medical History:  Diagnosis Date  . Hypertension     Past Surgical History:  Procedure Laterality Date  . CESAREAN SECTION    . UTERINE FIBROID SURGERY      Social History   Socioeconomic History  . Marital status: Divorced    Spouse name: Not on file  . Number of children: Not on file  . Years of education: Not on file  . Highest education level: Not on file  Occupational History  . Not on file  Social Needs  . Financial resource strain: Not  on file  . Food insecurity:    Worry: Not on file    Inability: Not on file  . Transportation needs:    Medical: Not on file    Non-medical: Not on file  Tobacco Use  . Smoking status: Never Smoker  . Smokeless tobacco: Never Used  Substance and Sexual Activity  . Alcohol use: No  . Drug use: No  . Sexual activity: Yes    Birth control/protection: None  Lifestyle  . Physical activity:    Days per week: Not on file    Minutes per session: Not on file  . Stress: Not on file  Relationships  . Social connections:    Talks on phone: Not on file    Gets  together: Not on file    Attends religious service: Not on file    Active member of club or organization: Not on file    Attends meetings of clubs or organizations: Not on file    Relationship status: Not on file  . Intimate partner violence:    Fear of current or ex partner: Not on file    Emotionally abused: Not on file    Physically abused: Not on file    Forced sexual activity: Not on file  Other Topics Concern  . Not on file  Social History Narrative  . Not on file    No family history on file.   Review of Systems  Constitutional: Negative.  Negative for chills and fever.  HENT: Negative.   Eyes: Negative.   Respiratory: Negative for cough and shortness of breath.   Cardiovascular: Positive for leg swelling. Negative for chest pain and palpitations.  Gastrointestinal: Negative.  Negative for abdominal pain, diarrhea, nausea and vomiting.  Genitourinary: Negative.  Negative for dysuria and hematuria.  Musculoskeletal: Negative.  Negative for myalgias and neck pain.  Skin: Negative.  Negative for rash.  Neurological: Negative.  Negative for dizziness and headaches.  Endo/Heme/Allergies: Negative.   All other systems reviewed and are negative.   Vitals:   05/12/17 1719  BP: 130/88  Pulse: 74  Resp: 16  Temp: 98.8 F (37.1 C)  SpO2: 100%    Physical Exam  Constitutional: She is oriented to person, place, and time. She appears well-developed and well-nourished.  HENT:  Head: Normocephalic and atraumatic.  Mouth/Throat: Oropharynx is clear and moist.  Eyes: Pupils are equal, round, and reactive to light. Conjunctivae and EOM are normal.  Neck: Normal range of motion. Neck supple.  Cardiovascular: Normal rate, regular rhythm and normal heart sounds.  Pulmonary/Chest: Effort normal and breath sounds normal.  Abdominal: Soft. Bowel sounds are normal. She exhibits no distension. There is no tenderness.  Musculoskeletal: Normal range of motion. She exhibits edema (Mild  ankle edema).  Neurological: She is alert and oriented to person, place, and time.  Skin: Skin is warm and dry. Capillary refill takes less than 2 seconds. No rash noted.  Psychiatric: She has a normal mood and affect. Her behavior is normal.  Vitals reviewed.  .Essential hypertension Hypertension still not at ideal target.  We will add lisinopril/HCTZ 20/12.5 and stop HCTZ 25.  Will continue amlodipine 10 mg a day.  Encouraged to follow low-salt moderate carbohydrate diet and exercise regularly.  Follow-up in 3 months.  A total of 25 minutes was spent in the room with the patient, greater than 50% of which was in counseling/coordination of care regarding hypertension, management, diet, and exercise.    ASSESSMENT & PLAN: Nixie was seen  today for hypertension.  Diagnoses and all orders for this visit:  Essential hypertension -     lisinopril-hydrochlorothiazide (ZESTORETIC) 20-12.5 MG tablet; Take 1 tablet by mouth daily.    Patient Instructions       IF you received an x-ray today, you will receive an invoice from [facility]. Please contact [facility] at [phone] with questions or concerns regarding your invoice.   IF you received labwork today, you will receive an invoice from [facility]. Please contact [facility] at [phone] with questions or concerns regarding your invoice.   Our billing staff will not be able to assist you with questions regarding bills from these companies.  You will be contacted with the lab results as soon as they are available. The fastest way to get your results is to activate your My Chart account. Instructions are located on the last page of this paperwork. If you have not heard from [place] regarding the results in 2 weeks, please contact this office.     DASH Eating Plan DASH stands for "Dietary Approaches to Stop Hypertension." The DASH eating plan is a healthy eating plan that has been shown to reduce high blood pressure  (hypertension). It may also reduce your risk for type 2 diabetes, heart disease, and stroke. The DASH eating plan may also help with weight loss. What are tips for following this plan? General guidelines  Avoid eating more than 2,300 mg (milligrams) of salt (sodium) a day. If you have hypertension, you may need to reduce your sodium intake to 1,500 mg a day.  Limit alcohol intake to no more than 1 drink a day for nonpregnant women and 2 drinks a day for men. One drink equals 12 oz of beer, 5 oz of wine, or 1 oz of hard liquor.  Work with your health care provider to maintain a healthy body weight or to lose weight. Ask what an ideal weight is for you.  Get at least 30 minutes of exercise that causes your heart to beat faster (aerobic exercise) most days of the week. Activities may include walking, swimming, or biking.  Work with your health care provider or diet and nutrition specialist (dietitian) to adjust your eating plan to your individual calorie needs. Reading food labels  Check food labels for the amount of sodium per serving. Choose foods with less than 5 percent of the Daily Value of sodium. Generally, foods with less than 300 mg of sodium per serving fit into this eating plan.  To find whole grains, look for the word "whole" as the first word in the ingredient list. Shopping  Buy products labeled as "low-sodium" or "no salt added."  Buy fresh foods. Avoid canned foods and premade or frozen meals. Cooking  Avoid adding salt when cooking. Use salt-free seasonings or herbs instead of table salt or sea salt. Check with your health care provider or pharmacist before using salt substitutes.  Do not fry foods. Cook foods using healthy methods such as baking, boiling, grilling, and broiling instead.  Cook with heart-healthy oils, such as olive, canola, soybean, or sunflower oil. Meal planning   Eat a balanced diet that includes: ? 5 or more servings of fruits and vegetables each  day. At each meal, try to fill half of your plate with fruits and vegetables. ? Up to 6-8 servings of whole grains each day. ? Less than 6 oz of lean meat, poultry, or fish each day. A 3-oz serving of meat is about the same size as a  deck of cards. One egg equals 1 oz. ? 2 servings of low-fat dairy each day. ? A serving of nuts, seeds, or beans 5 times each week. ? Heart-healthy fats. Healthy fats called Omega-3 fatty acids are found in foods such as flaxseeds and coldwater fish, like sardines, salmon, and mackerel.  Limit how much you eat of the following: ? Canned or prepackaged foods. ? Food that is high in trans fat, such as fried foods. ? Food that is high in saturated fat, such as fatty meat. ? Sweets, desserts, sugary drinks, and other foods with added sugar. ? Full-fat dairy products.  Do not salt foods before eating.  Try to eat at least 2 vegetarian meals each week.  Eat more home-cooked food and less restaurant, buffet, and fast food.  When eating at a restaurant, ask that your food be prepared with less salt or no salt, if possible. What foods are recommended? The items listed may not be a complete list. Talk with your dietitian about what dietary choices are best for you. Grains Whole-grain or whole-wheat bread. Whole-grain or whole-wheat pasta. Brown rice. [place]. Bulgur. Whole-grain and low-sodium cereals. Pita bread. Low-fat, low-sodium crackers. Whole-wheat flour tortillas. Vegetables Fresh or frozen vegetables (raw, steamed, roasted, or grilled). Low-sodium or reduced-sodium tomato and vegetable juice. Low-sodium or reduced-sodium tomato sauce and tomato paste. Low-sodium or reduced-sodium canned vegetables. Fruits All fresh, dried, or frozen fruit. Canned fruit in natural juice (without added sugar). Meat and other protein foods Skinless chicken or [place]. Ground chicken or [place]. Pork with fat trimmed off. Fish and seafood. Egg whites. Dried beans, peas, or  lentils. Unsalted nuts, nut butters, and seeds. Unsalted canned beans. Lean cuts of beef with fat trimmed off. Low-sodium, lean deli meat. Dairy Low-fat (1%) or fat-free (skim) milk. Fat-free, low-fat, or reduced-fat cheeses. Nonfat, low-sodium ricotta or cottage cheese. Low-fat or nonfat yogurt. Low-fat, low-sodium cheese. Fats and oils Soft margarine without trans fats. Vegetable oil. Low-fat, reduced-fat, or light mayonnaise and salad dressings (reduced-sodium). Canola, safflower, olive, soybean, and sunflower oils. Avocado. Seasoning and other foods Herbs. Spices. Seasoning mixes without salt. Unsalted popcorn and pretzels. Fat-free sweets. What foods are not recommended? The items listed may not be a complete list. Talk with your dietitian about what dietary choices are best for you. Grains Baked goods made with fat, such as croissants, muffins, or some breads. Dry pasta or rice meal packs. Vegetables Creamed or fried vegetables. Vegetables in a cheese sauce. Regular canned vegetables (not low-sodium or reduced-sodium). Regular canned tomato sauce and paste (not low-sodium or reduced-sodium). Regular tomato and vegetable juice (not low-sodium or reduced-sodium). Angie Fava. Olives. Fruits Canned fruit in a light or heavy syrup. Fried fruit. Fruit in cream or butter sauce. Meat and other protein foods Fatty cuts of meat. Ribs. Fried meat. Berniece Salines. Sausage. Bologna and other processed lunch meats. Salami. Fatback. Hotdogs. Bratwurst. Salted nuts and seeds. Canned beans with added salt. Canned or smoked fish. Whole eggs or egg yolks. Chicken or [place] with skin. Dairy Whole or 2% milk, cream, and half-and-half. Whole or full-fat cream cheese. Whole-fat or sweetened yogurt. Full-fat cheese. Nondairy creamers. Whipped toppings. Processed cheese and cheese spreads. Fats and oils Butter. Stick margarine. Lard. Shortening. Ghee. Bacon fat. Tropical oils, such as coconut, palm kernel, or palm  oil. Seasoning and other foods Salted popcorn and pretzels. Onion salt, garlic salt, seasoned salt, table salt, and sea salt. Worcestershire sauce. Tartar sauce. Barbecue sauce. Teriyaki sauce. Soy sauce, including reduced-sodium. Steak sauce. Canned  and packaged gravies. Fish sauce. Oyster sauce. Cocktail sauce. Horseradish that you find on the shelf. Ketchup. Mustard. Meat flavorings and tenderizers. Bouillon cubes. Hot sauce and Tabasco sauce. Premade or packaged marinades. Premade or packaged taco seasonings. Relishes. Regular salad dressings. Where to find more information:  National Heart, Lung, and [place]: https://wilson-eaton.com/  American Heart Association: www.heart.org Summary  The DASH eating plan is a healthy eating plan that has been shown to reduce high blood pressure (hypertension). It may also reduce your risk for type 2 diabetes, heart disease, and stroke.  With the DASH eating plan, you should limit salt (sodium) intake to 2,300 mg a day. If you have hypertension, you may need to reduce your sodium intake to 1,500 mg a day.  When on the DASH eating plan, aim to eat more fresh fruits and vegetables, whole grains, lean proteins, low-fat dairy, and heart-healthy fats.  Work with your health care provider or diet and nutrition specialist (dietitian) to adjust your eating plan to your individual calorie needs. This information is not intended to replace advice given to you by your health care provider. Make sure you discuss any questions you have with your health care provider. Document Released: 12/23/2010 Document Revised: 12/28/2015 Document Reviewed: 12/28/2015 Elsevier Interactive Patient Education  2018 [employer].  Hypertension Hypertension, commonly called high blood pressure, is when the force of blood pumping through the arteries is too strong. The arteries are the blood vessels that carry blood from the heart throughout the body. Hypertension forces the heart to  work harder to pump blood and may cause arteries to become narrow or stiff. Having untreated or uncontrolled hypertension can cause heart attacks, strokes, kidney disease, and other problems. A blood pressure reading consists of a higher number over a lower number. Ideally, your blood pressure should be below 120/80. The first ("top") number is called the systolic pressure. It is a measure of the pressure in your arteries as your heart beats. The second ("bottom") number is called the diastolic pressure. It is a measure of the pressure in your arteries as the heart relaxes. What are the causes? The cause of this condition is not known. What increases the risk? Some risk factors for high blood pressure are under your control. Others are not. Factors you can change  Smoking.  Having type 2 diabetes mellitus, high cholesterol, or both.  Not getting enough exercise or physical activity.  Being overweight.  Having too much fat, sugar, calories, or salt (sodium) in your diet.  Drinking too much alcohol. Factors that are difficult or impossible to change  Having chronic kidney disease.  Having a family history of high blood pressure.  Age. Risk increases with age.  Race. You may be at higher risk if you are African-American.  Gender. Men are at higher risk than women before age 40. After age 43, women are at higher risk than men.  Having obstructive sleep apnea.  Stress. What are the signs or symptoms? Extremely high blood pressure (hypertensive crisis) may cause:  Headache.  Anxiety.  Shortness of breath.  Nosebleed.  Nausea and vomiting.  Severe chest pain.  Jerky movements you cannot control (seizures).  How is this diagnosed? This condition is diagnosed by measuring your blood pressure while you are seated, with your arm resting on a surface. The cuff of the blood pressure monitor will be placed directly against the skin of your upper arm at the level of your heart.  It should be measured at least twice  using the same arm. Certain conditions can cause a difference in blood pressure between your right and left arms. Certain factors can cause blood pressure readings to be lower or higher than normal (elevated) for a short period of time:  When your blood pressure is higher when you are in a health care provider's office than when you are at home, this is called white coat hypertension. Most people with this condition do not need medicines.  When your blood pressure is higher at home than when you are in a health care provider's office, this is called masked hypertension. Most people with this condition may need medicines to control blood pressure.  If you have a high blood pressure reading during one visit or you have normal blood pressure with other risk factors:  You may be asked to return on a different day to have your blood pressure checked again.  You may be asked to monitor your blood pressure at home for 1 week or longer.  If you are diagnosed with hypertension, you may have other blood or imaging tests to help your health care provider understand your overall risk for other conditions. How is this treated? This condition is treated by making healthy lifestyle changes, such as eating healthy foods, exercising more, and reducing your alcohol intake. Your health care provider may prescribe medicine if lifestyle changes are not enough to get your blood pressure under control, and if:  Your systolic blood pressure is above 130.  Your diastolic blood pressure is above 80.  Your personal target blood pressure may vary depending on your medical conditions, your age, and other factors. Follow these instructions at home: Eating and drinking  Eat a diet that is high in fiber and potassium, and low in sodium, added sugar, and fat. An example eating plan is called the DASH (Dietary Approaches to Stop Hypertension) diet. To eat this way: ? Eat plenty of fresh  fruits and vegetables. Try to fill half of your plate at each meal with fruits and vegetables. ? Eat whole grains, such as whole wheat pasta, brown rice, or whole grain bread. Fill about one quarter of your plate with whole grains. ? Eat or drink low-fat dairy products, such as skim milk or low-fat yogurt. ? Avoid fatty cuts of meat, processed or cured meats, and poultry with skin. Fill about one quarter of your plate with lean proteins, such as fish, chicken without skin, beans, eggs, and tofu. ? Avoid premade and processed foods. These tend to be higher in sodium, added sugar, and fat.  Reduce your daily sodium intake. Most people with hypertension should eat less than 1,500 mg of sodium a day.  Limit alcohol intake to no more than 1 drink a day for nonpregnant women and 2 drinks a day for men. One drink equals 12 oz of beer, 5 oz of wine, or 1 oz of hard liquor. Lifestyle  Work with your health care provider to maintain a healthy body weight or to lose weight. Ask what an ideal weight is for you.  Get at least 30 minutes of exercise that causes your heart to beat faster (aerobic exercise) most days of the week. Activities may include walking, swimming, or biking.  Include exercise to strengthen your muscles (resistance exercise), such as pilates or lifting weights, as part of your weekly exercise routine. Try to do these types of exercises for 30 minutes at least 3 days a week.  Do not use any products that contain nicotine or tobacco,  such as cigarettes and e-cigarettes. If you need help quitting, ask your health care provider.  Monitor your blood pressure at home as told by your health care provider.  Keep all follow-up visits as told by your health care provider. This is important. Medicines  Take over-the-counter and prescription medicines only as told by your health care provider. Follow directions carefully. Blood pressure medicines must be taken as prescribed.  Do not skip doses  of blood pressure medicine. Doing this puts you at risk for problems and can make the medicine less effective.  Ask your health care provider about side effects or reactions to medicines that you should watch for. Contact a health care provider if:  You think you are having a reaction to a medicine you are taking.  You have headaches that keep coming back (recurring).  You feel dizzy.  You have swelling in your ankles.  You have trouble with your vision. Get help right away if:  You develop a severe headache or confusion.  You have unusual weakness or numbness.  You feel faint.  You have severe pain in your chest or abdomen.  You vomit repeatedly.  You have trouble breathing. Summary  Hypertension is when the force of blood pumping through your arteries is too strong. If this condition is not controlled, it may put you at risk for serious complications.  Your personal target blood pressure may vary depending on your medical conditions, your age, and other factors. For most people, a normal blood pressure is less than 120/80.  Hypertension is treated with lifestyle changes, medicines, or a combination of both. Lifestyle changes include weight loss, eating a healthy, low-sodium diet, exercising more, and limiting alcohol. This information is not intended to replace advice given to you by your health care provider. Make sure you discuss any questions you have with your health care provider. Document Released: 01/03/2005 Document Revised: 12/02/2015 Document Reviewed: 12/02/2015 Elsevier Interactive Patient Education  2018 Elsevier Inc.      Agustina Caroli, MD [facility]

## 2017-05-12 NOTE — Assessment & Plan Note (Signed)
Hypertension still not at ideal target.  We will add lisinopril/HCTZ 20/12.5 and stop HCTZ 25.  Will continue amlodipine 10 mg a day.  Encouraged to follow low-salt moderate carbohydrate diet and exercise regularly.  Follow-up in 3 months.

## 2017-06-27 ENCOUNTER — Telehealth: Payer: Self-pay | Admitting: Emergency Medicine

## 2017-06-27 NOTE — Telephone Encounter (Signed)
Copied from [place] [id number]. Topic: Quick Communication - Rx Refill/Question >> Jun 27, 2017  1:05 PM Margot Ables wrote: Medication: amLODipine (NORVASC) 10 MG tablet - not yet prescribed by Dr. Mitchel Honour - pt is out of medication Has the patient contacted their pharmacy? Yes - told she had to call MD and they would not request Preferred Pharmacy (with phone number or street name): [facility] ([address] [place] [phone] (Phone) [phone] (Fax)

## 2017-06-28 NOTE — Telephone Encounter (Signed)
OV on 05-12-17 with Dr. Franky Macho for hypertension / Patient is requesting a refill on Norvasc, however, it has not been prescribed by Dr. Franky Macho / At office visit in April, Lisinopril-hydrochlorothiazide was prescribe.  This is for blood pressure

## 2017-06-30 ENCOUNTER — Other Ambulatory Visit: Payer: Self-pay

## 2017-06-30 MED ORDER — AMLODIPINE BESYLATE 10 MG PO TABS: 10.0000 mg | ORAL_TABLET | Freq: Every day | ORAL | 1 refills | Status: DC

## 2017-08-11 ENCOUNTER — Encounter: Payer: Self-pay | Admitting: Emergency Medicine

## 2017-08-11 ENCOUNTER — Ambulatory Visit (INDEPENDENT_AMBULATORY_CARE_PROVIDER_SITE_OTHER): Payer: 59 | Admitting: Emergency Medicine

## 2017-08-11 ENCOUNTER — Other Ambulatory Visit: Payer: Self-pay

## 2017-08-11 VITALS — BP 111/74 | HR 79 | Temp 98.9°F | Resp 16 | Ht 60.5 in | Wt 166.4 lb

## 2017-08-11 DIAGNOSIS — Z1239 Encounter for other screening for malignant neoplasm of breast: Secondary | ICD-10-CM

## 2017-08-11 DIAGNOSIS — Z1231 Encounter for screening mammogram for malignant neoplasm of breast: Secondary | ICD-10-CM | POA: Diagnosis not present

## 2017-08-11 DIAGNOSIS — F4323 Adjustment disorder with mixed anxiety and depressed mood: Secondary | ICD-10-CM

## 2017-08-11 DIAGNOSIS — I10 Essential (primary) hypertension: Secondary | ICD-10-CM

## 2017-08-11 MED ORDER — CYCLOBENZAPRINE HCL 10 MG PO TABS: 10.0000 mg | ORAL_TABLET | Freq: Two times a day (BID) | ORAL | 0 refills | Status: DC | PRN

## 2017-08-11 MED ORDER — DULOXETINE HCL 30 MG PO CPEP: 30.0000 mg | ORAL_CAPSULE | Freq: Every day | ORAL | 3 refills | Status: DC
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
## 2017-08-11 NOTE — Patient Instructions (Addendum)
   IF you received an x-ray today, you will receive an invoice from [facility]. Please contact [facility] at [phone] with questions or concerns regarding your invoice.   IF you received labwork today, you will receive an invoice from [facility]. Please contact [facility] at [phone] with questions or concerns regarding your invoice.   Our billing staff will not be able to assist you with questions regarding bills from these companies.  You will be contacted with the lab results as soon as they are available. The fastest way to get your results is to activate your My Chart account. Instructions are located on the last page of this paperwork. If you have not heard from us regarding the results in 2 weeks, please contact this office.     Living With Anxiety After being diagnosed with an anxiety disorder, you may be relieved to know why you have felt or behaved a certain way. It is natural to also feel overwhelmed about the treatment ahead and what it will mean for your life. With care and support, you can manage this condition and recover from it. How to cope with anxiety Dealing with stress Stress is your body's reaction to life changes and events, both good and bad. Stress can last just a few hours or it can be ongoing. Stress can play a major role in anxiety, so it is important to learn both how to cope with stress and how to think about it differently. Talk with your health care provider or a counselor to learn more about stress reduction. He or she may suggest some stress reduction techniques, such as:  Music therapy. This can include creating or listening to music that you enjoy and that inspires you.  Mindfulness-based meditation. This involves being aware of your normal breaths, rather than trying to control your breathing. It can be done while sitting or walking.  Centering prayer. This is a kind of meditation that involves focusing on a word, phrase, or  sacred image that is meaningful to you and that brings you peace.  Deep breathing. To do this, expand your stomach and inhale slowly through your nose. Hold your breath for 3-5 seconds. Then exhale slowly, allowing your stomach muscles to relax.  Self-talk. This is a skill where you identify thought patterns that lead to anxiety reactions and correct those thoughts.  Muscle relaxation. This involves tensing muscles then relaxing them.  Choose a stress reduction technique that fits your lifestyle and personality. Stress reduction techniques take time and practice. Set aside 5-15 minutes a day to do them. Therapists can offer training in these techniques. The training may be covered by some insurance plans. Other things you can do to manage stress include:  Keeping a stress diary. This can help you learn what triggers your stress and ways to control your response.  Thinking about how you respond to certain situations. You may not be able to control everything, but you can control your reaction.  Making time for activities that help you relax, and not feeling guilty about spending your time in this way.  Therapy combined with coping and stress-reduction skills provides the best chance for successful treatment. Medicines Medicines can help ease symptoms. Medicines for anxiety include:  Anti-anxiety drugs.  Antidepressants.  Beta-blockers.  Medicines may be used as the main treatment for anxiety disorder, along with therapy, or if other treatments are not working. Medicines should be prescribed by a health care provider. Relationships Relationships can play a big part in   helping you recover. Try to spend more time connecting with trusted friends and family members. Consider going to couples counseling, taking family education classes, or going to family therapy. Therapy can help you and others better understand the condition. How to recognize changes in your condition Everyone has a  different response to treatment for anxiety. Recovery from anxiety happens when symptoms decrease and stop interfering with your daily activities at home or work. This may mean that you will start to:  Have better concentration and focus.  Sleep better.  Be less irritable.  Have more energy.  Have improved memory.  It is important to recognize when your condition is getting worse. Contact your health care provider if your symptoms interfere with home or work and you do not feel like your condition is improving. Where to find help and support: You can get help and support from these sources:  Self-help groups.  Online and community organizations.  A trusted spiritual leader.  Couples counseling.  Family education classes.  Family therapy.  Follow these instructions at home:  Eat a healthy diet that includes plenty of vegetables, fruits, whole grains, low-fat dairy products, and lean protein. Do not eat a lot of foods that are high in solid fats, added sugars, or salt.  Exercise. Most adults should do the following: ? Exercise for at least 150 minutes each week. The exercise should increase your heart rate and make you sweat (moderate-intensity exercise). ? Strengthening exercises at least twice a week.  Cut down on caffeine, tobacco, alcohol, and other potentially harmful substances.  Get the right amount and quality of sleep. Most adults need 7-9 hours of sleep each night.  Make choices that simplify your life.  Take over-the-counter and prescription medicines only as told by your health care provider.  Avoid caffeine, alcohol, and certain over-the-counter cold medicines. These may make you feel worse. Ask your pharmacist which medicines to avoid.  Keep all follow-up visits as told by your health care provider. This is important. Questions to ask your health care provider  Would I benefit from therapy?  How often should I follow up with a health care  provider?  How long do I need to take medicine?  Are there any long-term side effects of my medicine?  Are there any alternatives to taking medicine? Contact a health care provider if:  You have a hard time staying focused or finishing daily tasks.  You spend many hours a day feeling worried about everyday life.  You become exhausted by worry.  You start to have headaches, feel tense, or have nausea.  You urinate more than normal.  You have diarrhea. Get help right away if:  You have a racing heart and shortness of breath.  You have thoughts of hurting yourself or others. If you ever feel like you may hurt yourself or others, or have thoughts about taking your own life, get help right away. You can go to your nearest emergency department or call:  Your local emergency services (911 in the [place]  A suicide crisis helpline, such as the National Suicide Prevention Lifeline at 1-800-273-8255. This is open 24-hours a day.  Summary  Taking steps to deal with stress can help calm you.  Medicines cannot cure anxiety disorders, but they can help ease symptoms.  Family, friends, and partners can play a big part in helping you recover from an anxiety disorder. This information is not intended to replace advice given to you by your health care provider. Make   sure you discuss any questions you have with your health care provider. Document Released: 12/29/2015 Document Revised: 12/29/2015 Document Reviewed: 12/29/2015 Elsevier Interactive Patient Education  2018 [employer].  Hypertension Hypertension, commonly called high blood pressure, is when the force of blood pumping through the arteries is too strong. The arteries are the blood vessels that carry blood from the heart throughout the body. Hypertension forces the heart to work harder to pump blood and may cause arteries to become narrow or stiff. Having untreated or uncontrolled hypertension can cause heart attacks, strokes,  kidney disease, and other problems. A blood pressure reading consists of a higher number over a lower number. Ideally, your blood pressure should be below 120/80. The first ("top") number is called the systolic pressure. It is a measure of the pressure in your arteries as your heart beats. The second ("bottom") number is called the diastolic pressure. It is a measure of the pressure in your arteries as the heart relaxes. What are the causes? The cause of this condition is not known. What increases the risk? Some risk factors for high blood pressure are under your control. Others are not. Factors you can change  Smoking.  Having type 2 diabetes mellitus, high cholesterol, or both.  Not getting enough exercise or physical activity.  Being overweight.  Having too much fat, sugar, calories, or salt (sodium) in your diet.  Drinking too much alcohol. Factors that are difficult or impossible to change  Having chronic kidney disease.  Having a family history of high blood pressure.  Age. Risk increases with age.  Race. You may be at higher risk if you are African-American.  Gender. Men are at higher risk than women before age 69. After age 1, women are at higher risk than men.  Having obstructive sleep apnea.  Stress. What are the signs or symptoms? Extremely high blood pressure (hypertensive crisis) may cause:  Headache.  Anxiety.  Shortness of breath.  Nosebleed.  Nausea and vomiting.  Severe chest pain.  Jerky movements you cannot control (seizures).  How is this diagnosed? This condition is diagnosed by measuring your blood pressure while you are seated, with your arm resting on a surface. The cuff of the blood pressure monitor will be placed directly against the skin of your upper arm at the level of your heart. It should be measured at least twice using the same arm. Certain conditions can cause a difference in blood pressure between your right and left  arms. Certain factors can cause blood pressure readings to be lower or higher than normal (elevated) for a short period of time:  When your blood pressure is higher when you are in a health care provider's office than when you are at home, this is called white coat hypertension. Most people with this condition do not need medicines.  When your blood pressure is higher at home than when you are in a health care provider's office, this is called masked hypertension. Most people with this condition may need medicines to control blood pressure.  If you have a high blood pressure reading during one visit or you have normal blood pressure with other risk factors:  You may be asked to return on a different day to have your blood pressure checked again.  You may be asked to monitor your blood pressure at home for 1 week or longer.  If you are diagnosed with hypertension, you may have other blood or imaging tests to help your health care provider understand your  overall risk for other conditions. How is this treated? This condition is treated by making healthy lifestyle changes, such as eating healthy foods, exercising more, and reducing your alcohol intake. Your health care provider may prescribe medicine if lifestyle changes are not enough to get your blood pressure under control, and if:  Your systolic blood pressure is above 130.  Your diastolic blood pressure is above 80.  Your personal target blood pressure may vary depending on your medical conditions, your age, and other factors. Follow these instructions at home: Eating and drinking  Eat a diet that is high in fiber and potassium, and low in sodium, added sugar, and fat. An example eating plan is called the DASH (Dietary Approaches to Stop Hypertension) diet. To eat this way: ? Eat plenty of fresh fruits and vegetables. Try to fill half of your plate at each meal with fruits and vegetables. ? Eat whole grains, such as whole wheat pasta,  brown rice, or whole grain bread. Fill about one quarter of your plate with whole grains. ? Eat or drink low-fat dairy products, such as skim milk or low-fat yogurt. ? Avoid fatty cuts of meat, processed or cured meats, and poultry with skin. Fill about one quarter of your plate with lean proteins, such as fish, chicken without skin, beans, eggs, and tofu. ? Avoid premade and processed foods. These tend to be higher in sodium, added sugar, and fat.  Reduce your daily sodium intake. Most people with hypertension should eat less than 1,500 mg of sodium a day.  Limit alcohol intake to no more than 1 drink a day for nonpregnant women and 2 drinks a day for men. One drink equals 12 oz of beer, 5 oz of wine, or 1 oz of hard liquor. Lifestyle  Work with your health care provider to maintain a healthy body weight or to lose weight. Ask what an ideal weight is for you.  Get at least 30 minutes of exercise that causes your heart to beat faster (aerobic exercise) most days of the week. Activities may include walking, swimming, or biking.  Include exercise to strengthen your muscles (resistance exercise), such as pilates or lifting weights, as part of your weekly exercise routine. Try to do these types of exercises for 30 minutes at least 3 days a week.  Do not use any products that contain nicotine or tobacco, such as cigarettes and e-cigarettes. If you need help quitting, ask your health care provider.  Monitor your blood pressure at home as told by your health care provider.  Keep all follow-up visits as told by your health care provider. This is important. Medicines  Take over-the-counter and prescription medicines only as told by your health care provider. Follow directions carefully. Blood pressure medicines must be taken as prescribed.  Do not skip doses of blood pressure medicine. Doing this puts you at risk for problems and can make the medicine less effective.  Ask your health care provider  about side effects or reactions to medicines that you should watch for. Contact a health care provider if:  You think you are having a reaction to a medicine you are taking.  You have headaches that keep coming back (recurring).  You feel dizzy.  You have swelling in your ankles.  You have trouble with your vision. Get help right away if:  You develop a severe headache or confusion.  You have unusual weakness or numbness.  You feel faint.  You have severe pain in your chest or  abdomen.  You vomit repeatedly.  You have trouble breathing. Summary  Hypertension is when the force of blood pumping through your arteries is too strong. If this condition is not controlled, it may put you at risk for serious complications.  Your personal target blood pressure may vary depending on your medical conditions, your age, and other factors. For most people, a normal blood pressure is less than 120/80.  Hypertension is treated with lifestyle changes, medicines, or a combination of both. Lifestyle changes include weight loss, eating a healthy, low-sodium diet, exercising more, and limiting alcohol. This information is not intended to replace advice given to you by your health care provider. Make sure you discuss any questions you have with your health care provider. Document Released: 01/03/2005 Document Revised: 12/02/2015 Document Reviewed: 12/02/2015 Elsevier Interactive Patient Education  [employer].

## 2017-08-24 ENCOUNTER — Telehealth: Payer: Self-pay | Admitting: Emergency Medicine

## 2017-08-24 NOTE — Telephone Encounter (Signed)
Copied from [facility] [id number]. Topic: Quick Communication - Office Called Patient >> Aug 24, 2017  3:32 PM Cecelia Byars, [place] wrote: Reason for CRM: Patient accidentally deleted the  message before she was able to listen to it,please call back

## 2017-08-31 NOTE — Telephone Encounter (Signed)
No documentation in chart - no one called from PCP

## 2017-10-05 ENCOUNTER — Ambulatory Visit: Payer: Self-pay

## 2017-10-10 ENCOUNTER — Ambulatory Visit: Payer: 59 | Admitting: Psychology

## 2017-10-24 ENCOUNTER — Telehealth (HOSPITAL_BASED_OUTPATIENT_CLINIC_OR_DEPARTMENT_OTHER): Payer: Self-pay | Admitting: Emergency Medicine

## 2017-10-24 ENCOUNTER — Ambulatory Visit: Payer: Self-pay | Admitting: Family Medicine

## 2017-10-24 ENCOUNTER — Other Ambulatory Visit: Payer: Self-pay

## 2017-10-24 ENCOUNTER — Emergency Department (HOSPITAL_BASED_OUTPATIENT_CLINIC_OR_DEPARTMENT_OTHER)
Admission: EM | Admit: 2017-10-24 | Discharge: 2017-10-24 | Disposition: A | Discharge status: Home / Self Care | Payer: 59 | Attending: Emergency Medicine | Admitting: Emergency Medicine

## 2017-10-24 ENCOUNTER — Ambulatory Visit: Payer: Self-pay | Admitting: *Deleted

## 2017-10-24 ENCOUNTER — Encounter (HOSPITAL_BASED_OUTPATIENT_CLINIC_OR_DEPARTMENT_OTHER): Payer: Self-pay | Admitting: Emergency Medicine

## 2017-10-24 ENCOUNTER — Ambulatory Visit: Payer: Self-pay | Admitting: Emergency Medicine

## 2017-10-24 DIAGNOSIS — B009 Herpesviral infection, unspecified: Secondary | ICD-10-CM | POA: Diagnosis not present

## 2017-10-24 DIAGNOSIS — K130 Diseases of lips: Secondary | ICD-10-CM | POA: Diagnosis present

## 2017-10-24 DIAGNOSIS — I10 Essential (primary) hypertension: Secondary | ICD-10-CM | POA: Diagnosis not present

## 2017-10-24 MED ORDER — VALACYCLOVIR HCL 1 G PO TABS: 1000.0000 mg | ORAL_TABLET | Freq: Two times a day (BID) | ORAL | 0 refills | Status: AC

## 2017-10-24 MED ORDER — MUPIROCIN CALCIUM 2 % EX CREA: 1.0000 "application " | TOPICAL_CREAM | Freq: Two times a day (BID) | CUTANEOUS | 0 refills | Status: AC

## 2017-10-24 NOTE — ED Notes (Signed)
ED Provider at bedside. 

## 2017-10-24 NOTE — Progress Notes (Deleted)
No chief complaint on file.   HPI  4 review of systems  Past Medical History:  Diagnosis Date  . Hypertension     Current Outpatient Medications  Medication Sig Dispense Refill  . amLODipine (NORVASC) 10 MG tablet Take 1 tablet (10 mg total) by mouth daily. 30 tablet 1  . cyclobenzaprine (FLEXERIL) 10 MG tablet Take 1 tablet (10 mg total) by mouth 2 (two) times daily as needed for muscle spasms. 20 tablet 0  . DULoxetine (CYMBALTA) 30 MG capsule Take 1 capsule (30 mg total) by mouth daily. 30 capsule 3  . fexofenadine (ALLEGRA) 180 MG tablet Take 180 mg by mouth daily.    . hydrochlorothiazide (HYDRODIURIL) 25 MG tablet Take 1 tablet (25 mg total) by mouth daily. 90 tablet 1  . ibuprofen (ADVIL,MOTRIN) 800 MG tablet Take 1 tablet (800 mg total) by mouth 3 (three) times daily. (Patient taking differently: Take 800 mg by mouth 3 (three) times daily as needed for moderate pain. ) 21 tablet 0  . levofloxacin (LEVAQUIN) 500 MG tablet Take 1 tablet (500 mg total) by mouth daily. (Patient not taking: Reported on 04/14/2017) 14 tablet 0  . lisinopril-hydrochlorothiazide (ZESTORETIC) 20-12.5 MG tablet Take 1 tablet by mouth daily. 90 tablet 3  . metroNIDAZOLE (FLAGYL) 500 MG tablet Take 1 tablet (500 mg total) by mouth 2 (two) times daily. (Patient not taking: Reported on 04/14/2017) 28 tablet 0  . Multiple Vitamin (MULTIVITAMIN WITH MINERALS) TABS tablet Take 1 tablet by mouth daily.    . naproxen (NAPROSYN) 500 MG tablet Take 500 mg by mouth 2 (two) times daily as needed for mild pain.     No current facility-administered medications for this visit.     Allergies:  Allergies  Allergen Reactions  . Omnicef [Cefdinir] Itching  . Latex Swelling and Rash  . Penicillins Rash    Has patient had a PCN reaction causing immediate rash, facial/tongue/throat swelling, SOB or lightheadedness with hypotension: No Has patient had a PCN reaction causing severe rash involving mucus membranes or skin  necrosis: No Has patient had a PCN reaction that required hospitalization NO Has patient had a PCN reaction occurring within the last 10 years: NO If all of the above answers are "NO", then may proceed with Cephalosporin use.    Past Surgical History:  Procedure Laterality Date  . CESAREAN SECTION    . UTERINE FIBROID SURGERY      Social History   Socioeconomic History  . Marital status: Divorced    Spouse name: Not on file  . Number of children: Not on file  . Years of education: Not on file  . Highest education level: Not on file  Occupational History  . Not on file  Social Needs  . Financial resource strain: Not on file  . Food insecurity:    Worry: Not on file    Inability: Not on file  . Transportation needs:    Medical: Not on file    Non-medical: Not on file  Tobacco Use  . Smoking status: Never Smoker  . Smokeless tobacco: Never Used  Substance and Sexual Activity  . Alcohol use: No  . Drug use: No  . Sexual activity: Yes    Birth control/protection: None  Lifestyle  . Physical activity:    Days per week: Not on file    Minutes per session: Not on file  . Stress: Not on file  Relationships  . Social connections:    Talks on phone: Not on  file    Gets together: Not on file    Attends religious service: Not on file    Active member of club or organization: Not on file    Attends meetings of clubs or organizations: Not on file    Relationship status: Not on file  Other Topics Concern  . Not on file  Social History Narrative  . Not on file    No family history on file.   ROS Review of Systems See HPI Constitution: No fevers or chills No malaise No diaphoresis Skin: No rash or itching Eyes: no blurry vision, no double vision GU: no dysuria or hematuria Neuro: no dizziness or headaches * all others reviewed and negative   Objective: There were no vitals filed for this visit.  Physical Exam  Assessment and Plan There are no diagnoses  linked to this encounter.   Tracy [name] P [employer]

## 2017-10-24 NOTE — Telephone Encounter (Signed)
Pt started out on Sunday morning with a bump like pimple underneath her bottom lip. It was itching and swollen. She was advised to use Abreva for this by a friend. Abreva did not help. By Monday her bottom lip had started to swell. She used Blistex and that did not help either.  Today when she woke up her whole bottom lip is swollen and there is a cluster of fluid filled bumps underneath her lip. It does not look like a bug bite. Denies fever, no mouth ulcers.  She is on lisinopril-hydrochlorothiazide. Appointment scheduled for today.  She is to go to an urgent care or ED if the swelling gets worst.  She is going to the [facility] Emergency department to be assessed. Will call back to cancel her appointment for this afternoon. Will route this to Primary Care at [facility].  pReason for Disposition . [1] Severe swelling AND [2] cause unknown . Taking an ACE Inhibitor medication  (e.g., benazepril/LOTENSIN, captopril/CAPOTEN, enalapril/VASOTEC, lisinopril/ZESTRIL)  Answer Assessment - Initial Assessment Questions 1. ONSET: "When did the swelling start?" (e.g., minutes, hours, days)     Started on Sunday with the underneath her bottom lip 2. SEVERITY: "How swollen is it?"     Now the whole bottom lip is swollen 3. ITCHING: "Is there any itching?" If so, ask: "How much?"   (Scale 1-10; mild, moderate or severe)     yes 4. PAIN: "Is the swelling painful to touch?" If so, ask: "How painful is it?"   (Scale 1-10; mild, moderate or severe)     Tender to touch 5. CAUSE: "What do you think is causing the lip swelling?"     Not sure 6. RECURRENT SYMPTOM: "Have you had lip swelling before?" If so, ask: "When was the last time?" "What happened that time?"     no 7. OTHER SYMPTOMS: "Do you have any other symptoms?" (e.g., toothache)     no 8. PREGNANCY: "Is there any chance you are pregnant?" "When was your last menstrual period?"     No had uterine ablation  Protocols used: LIP  [facility]

## 2017-10-24 NOTE — ED Provider Notes (Signed)
[facility] Provider Note   CSN: [id number] Arrival date & time: 10/24/17  5809     History   Chief Complaint Chief Complaint  Patient presents with  . Allergic Reaction    HPI Tracy [name] is a 42 y.o. female.  HPI  Sunday had small little pimple on bottom lip, yesterday began to have pain, like a burning pain, lip feels hot, tried abreva, has been getting bigger Had walnuts yesterday in yogurt, had been eating them without problems before Began has itching Never had fever blister Sometimes will develop rash/hives, but no current No nausea, no vomiting, no shortness of breath or throat closing Takes lisinopril and amlodipine. No tongue swelling.   Past Medical History:  Diagnosis Date  . Hypertension     Patient Active Problem List   Diagnosis Date Noted  . 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 Essential hypertension 05/12/2017    Past Surgical History:  Procedure Laterality Date  . CESAREAN SECTION    . TONSILLECTOMY    . UTERINE FIBROID SURGERY       OB History   None      Home Medications    Prior to Admission medications   Medication Sig Start Date End Date Taking? Authorizing Provider  amLODipine (NORVASC) 10 MG tablet Take 1 tablet (10 mg total) by mouth daily. 06/30/17   Horald Pollen, MD  cyclobenzaprine (FLEXERIL) 10 MG tablet Take 1 tablet (10 mg total) by mouth 2 (two) times daily as needed for muscle spasms. 08/11/17   Horald Pollen, MD  DULoxetine (CYMBALTA) 30 MG capsule Take 1 capsule (30 mg total) by mouth daily. 08/11/17 09/10/17  Horald Pollen, MD  fexofenadine (ALLEGRA) 180 MG tablet Take 180 mg by mouth daily.    [provider]  ibuprofen (ADVIL,MOTRIN) 800 MG tablet Take 1 tablet (800 mg total) by mouth 3 (three) times daily. Patient taking differently: Take 800 mg by mouth 3 (three) times daily as needed for moderate pain.  08/06/15   Delsa Grana,  PA-C  lisinopril-hydrochlorothiazide (ZESTORETIC) 20-12.5 MG tablet Take 1 tablet by mouth daily. 05/12/17   Horald Pollen, MD  Multiple Vitamin (MULTIVITAMIN WITH MINERALS) TABS tablet Take 1 tablet by mouth daily.    [provider]  mupirocin cream (BACTROBAN) 2 % Apply 1 application topically 2 (two) times daily for 10 days. 10/24/17 11/03/17  Gareth Morgan, MD  valACYclovir (VALTREX) 1000 MG tablet Take 1 tablet (1,000 mg total) by mouth 2 (two) times daily for 10 days. 10/24/17 11/03/17  Gareth Morgan, MD    Family History No family history on file.  Social History Social History   Tobacco Use  . Smoking status: Never Smoker  . Smokeless tobacco: Never Used  Substance Use Topics  . Alcohol use: No  . Drug use: No     Allergies   Omnicef [cefdinir]; Latex; and Penicillins   Review of Systems Review of Systems  Constitutional: Negative for fever.  HENT: Positive for facial swelling. Negative for sore throat.   Eyes: Negative for visual disturbance.  Respiratory: Negative for cough and shortness of breath.   Cardiovascular: Negative for chest pain.  Gastrointestinal: Negative for abdominal pain.  Genitourinary: Negative for difficulty urinating.  Musculoskeletal: Negative for back pain and neck pain.  Skin: Negative for rash.  Neurological: Negative for syncope and headaches.     Physical Exam Updated Vital Signs BP 115/84 (BP Location: Right Arm)   Pulse  78   Temp 98.2 F (36.8 C) (Oral)   Resp 16   Ht 5\' 2"  (1.575 m)   Wt 77.1 kg   SpO2 100%   BMI 31.09 kg/m   Physical Exam  Constitutional: She is oriented to person, place, and time. She appears well-developed and well-nourished. No distress.  HENT:  Head: Normocephalic and atraumatic.  Ruptured vesicles below lower lip with honey crusting, surrounding swelling extending into lower lip No tongue or throat swelling  Eyes: Conjunctivae and EOM are normal.  Neck: Normal range of  motion.  Cardiovascular: Normal rate, regular rhythm and intact distal pulses.  Pulmonary/Chest: Effort normal. No respiratory distress.  Musculoskeletal: She exhibits no edema or tenderness.  Neurological: She is alert and oriented to person, place, and time.  Skin: Skin is warm and dry. No rash noted. She is not diaphoretic. No erythema.  Nursing note and vitals reviewed.    ED Treatments / Results  Labs (all labs ordered are listed, but only abnormal results are displayed) Labs Reviewed - No data to display  EKG None  Radiology No results found.  Procedures Procedures (including critical care time)  Medications Ordered in ED Medications - No data to display   Initial Impression / Assessment and Plan / ED Course  I have reviewed the triage vital signs and the nursing notes.  Pertinent labs & imaging results that were available during my care of the patient were reviewed by me and considered in my medical decision making (see chart for details).     42 year old female presents with concern for rash and lower lip swelling.  Lesions consistent with cluster of ruptured vesicles with honey crusting concerning for HSV, and suspect lip swelling is secondary to this viral eruption.  Given prescription for valacyclovir.  Also given prescription for topical Bactroban given possibility for mild bacterial superinfection.  There is no sign of surrounding cellulitis, no sign of abscess, do not feel oral antibiotics are indicated.  Feel the amount of swelling present is likely secondary to reaction from HSV.  Patient is on lisinopril, but given presence of lesions, no other tongue swelling or concerns, have low suspicion this represents angioedema.  No signs of anaphylaxis.  Recommend patient take medications and follow-up with her primary care physician, discussed reasons to return in detail.  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

## 2017-10-24 NOTE — ED Triage Notes (Signed)
Swelling of bottom lip starting yesterday with a group of vessicles below middle of bottom lip.

## 2017-11-06 ENCOUNTER — Other Ambulatory Visit: Payer: Self-pay | Admitting: Emergency Medicine

## 2017-11-06 ENCOUNTER — Encounter: Payer: Self-pay | Admitting: Medical

## 2017-11-06 NOTE — Telephone Encounter (Signed)
Copied from [place] [id number]. Topic: Quick Communication - Rx Refill/Question >> Nov 06, 2017  4:13 PM Jarold Motto, Fraser Din wrote: Medication: amLODipine (NORVASC) 10 MG tablet [[id number]] pt would like a 90 day supply   Has the patient contacted their pharmacy?yes  Preferred Pharmacy (with phone number or street name):[facility] ([address] [place] [phone] (Phone) [phone] (Fax)  Agent: Please be advised that RX refills may take up to 3 business days. We ask that you follow-up with your pharmacy.

## 2017-11-06 NOTE — Telephone Encounter (Deleted)
Copied from [place] [id number]. Topic: Quick Communication - Rx Refill/Question >> Nov 06, 2017  4:13 PM Jarold Motto, Fraser Din wrote: Medication: amLODipine (NORVASC) 10 MG tablet [[id number]] pt would like a 90 day supply   Has the patient contacted their pharmacy?yes  Preferred Pharmacy (with phone number or street name):[facility] ([address] [place] [phone] (Phone) [phone] (Fax)  Agent: Please be advised that RX refills may take up to 3 business days. We ask that you follow-up with your pharmacy.

## 2017-11-07 MED ORDER — AMLODIPINE BESYLATE 10 MG PO TABS: 10.0000 mg | ORAL_TABLET | Freq: Every day | ORAL | 0 refills | Status: DC

## 2017-11-07 NOTE — Telephone Encounter (Signed)
Requested Prescriptions  Pending Prescriptions Disp Refills  . amLODipine (NORVASC) 10 MG tablet 90 tablet 0    Sig: Take 1 tablet (10 mg total) by mouth daily.     Cardiovascular:  Calcium Channel Blockers Passed - 11/06/2017  4:24 PM      Passed - Last BP in normal range    BP Readings from Last 1 Encounters:  10/24/17 115/84         Passed - Valid encounter within last 6 months    Recent Outpatient Visits          2 months ago Adjustment reaction with anxiety and depression   Primary Care at [facility], Ines Bloomer, MD   5 months ago Essential hypertension   Primary Care at [facility], Ines Bloomer, MD   6 months ago Essential hypertension   Primary Care at [facility], Ines Bloomer, MD      Future Appointments            In 3 months [place], Ines Bloomer, MD Primary Care at [place], [facility]

## 2017-12-15 ENCOUNTER — Telehealth: Payer: Self-pay | Admitting: Emergency Medicine

## 2017-12-15 NOTE — Telephone Encounter (Signed)
Called and spoke with pt regarding their appt on 02/16/18 with Dr. Mitchel Honour. Due to provider schedule change, I was able to get pt rescheduled for 02/16/18 at 3:40 pm. I advised of time, building and late policy. Pt acknowledged.

## 2018-02-16 ENCOUNTER — Ambulatory Visit: Payer: 59 | Admitting: Emergency Medicine

## 2018-03-19 ENCOUNTER — Ambulatory Visit (INDEPENDENT_AMBULATORY_CARE_PROVIDER_SITE_OTHER): Payer: 59 | Admitting: Emergency Medicine

## 2018-03-19 ENCOUNTER — Other Ambulatory Visit: Payer: Self-pay

## 2018-03-19 ENCOUNTER — Encounter: Payer: Self-pay | Admitting: Emergency Medicine

## 2018-03-19 VITALS — BP 131/88 | HR 72 | Temp 98.9°F | Resp 16 | Ht 61.5 in | Wt 169.6 lb

## 2018-03-19 DIAGNOSIS — I10 Essential (primary) hypertension: Secondary | ICD-10-CM | POA: Diagnosis not present

## 2018-03-19 NOTE — Assessment & Plan Note (Signed)
Well-controlled.  Continue present medications.  Follow-up in 6 months. 

## 2018-03-19 NOTE — Progress Notes (Signed)
BP Readings from Last 3 Encounters:  03/19/18 131/88  10/24/17 115/84  08/11/17 111/74   Tracy [name] 43 y.o.   Chief Complaint  Patient presents with  . Hypertension    follow up x 6 month    HISTORY OF PRESENT ILLNESS: This is a 43 y.o. female with history of hypertension here for follow-up.  Taking amlodipine 10 mg once a day and Zestoretic 20-12.5 mg once a day.  Compliant with medications.  Systolic blood pressures at home between 120 and 979 and diastolics in the 89Q range.  Has no complaints or medical concerns today.  HPI   Prior to Admission medications   Medication Sig Start Date End Date Taking? Authorizing Provider  amLODipine (NORVASC) 10 MG tablet Take 1 tablet (10 mg total) by mouth daily. 11/07/17  Yes Tracy [name], Tracy Bloomer, MD  cyclobenzaprine (FLEXERIL) 10 MG tablet Take 1 tablet (10 mg total) by mouth 2 (two) times daily as needed for muscle spasms. 08/11/17  Yes Tracy [name], Tracy Bloomer, MD  fexofenadine (ALLEGRA) 180 MG tablet Take 180 mg by mouth daily.   Yes [provider]  ibuprofen (ADVIL,MOTRIN) 800 MG tablet Take 1 tablet (800 mg total) by mouth 3 (three) times daily. Patient taking differently: Take 800 mg by mouth 3 (three) times daily as needed for moderate pain.  08/06/15  Yes Tracy Grana, PA-C  lisinopril-hydrochlorothiazide (ZESTORETIC) 20-12.5 MG tablet Take 1 tablet by mouth daily. 05/12/17  Yes Tracy [name], Tracy Bloomer, MD  Multiple Vitamin (MULTIVITAMIN WITH MINERALS) TABS tablet Take 1 tablet by mouth daily.   Yes [provider]  DULoxetine (CYMBALTA) 30 MG capsule Take 1 capsule (30 mg total) by mouth daily. 08/11/17 09/10/17  Tracy Pollen, MD    Allergies  Allergen Reactions  . Omnicef [Cefdinir] Itching  . Latex Swelling and Rash  . Penicillins Rash    Has patient had a PCN reaction causing immediate rash, facial/tongue/throat swelling, SOB or lightheadedness with hypotension: No Has patient had a PCN reaction  causing severe rash involving mucus membranes or skin necrosis: No Has patient had a PCN reaction that required hospitalization NO Has patient had a PCN reaction occurring within the last 10 years: NO If all of the above answers are "NO", then may proceed with Cephalosporin use.    Patient Active Problem List   Diagnosis Date Noted  . 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 Essential hypertension 05/12/2017    Past Medical History:  Diagnosis Date  . Hypertension     Past Surgical History:  Procedure Laterality Date  . CESAREAN SECTION    . TONSILLECTOMY    . UTERINE FIBROID SURGERY      Social History   Socioeconomic History  . Marital status: Divorced    Spouse name: Not on file  . Number of children: Not on file  . Years of education: Not on file  . Highest education level: Not on file  Occupational History  . Not on file  Social Needs  . Financial resource strain: Not on file  . Food insecurity:    Worry: Not on file    Inability: Not on file  . Transportation needs:    Medical: Not on file    Non-medical: Not on file  Tobacco Use  . Smoking status: Never Smoker  . Smokeless tobacco: Never Used  Substance and Sexual Activity  . Alcohol use: No  . Drug use: No  . Sexual activity: Yes    Birth control/protection:  None  Lifestyle  . Physical activity:    Days per week: Not on file    Minutes per session: Not on file  . Stress: Not on file  Relationships  . Social connections:    Talks on phone: Not on file    Gets together: Not on file    Attends religious service: Not on file    Active member of club or organization: Not on file    Attends meetings of clubs or organizations: Not on file    Relationship status: Not on file  . Intimate partner violence:    Fear of current or ex partner: Not on file    Emotionally abused: Not on file    Physically abused: Not on file    Forced sexual activity: Not on file  Other Topics Concern    . Not on file  Social History Narrative  . Not on file    No family history on file.   Review of Systems  Constitutional: Negative.  Negative for fever.  HENT: Negative.   Eyes: Negative.  Negative for blurred vision and double vision.  Respiratory: Negative.  Negative for cough and shortness of breath.   Cardiovascular: Negative.  Negative for chest pain and palpitations.  Gastrointestinal: Negative.  Negative for abdominal pain, diarrhea, nausea and vomiting.  Genitourinary: Negative.   Musculoskeletal: Negative.   Skin: Negative.  Negative for rash.  Neurological: Negative.  Negative for dizziness and headaches.  Endo/Heme/Allergies: Negative.   All other systems reviewed and are negative.  Vitals:   03/19/18 1559  BP: 131/88  Pulse: 72  Resp: 16  Temp: 98.9 F (37.2 C)  SpO2: 98%     Physical Exam Vitals signs reviewed.  Constitutional:      Appearance: Normal appearance.  HENT:     Head: Normocephalic and atraumatic.     Mouth/Throat:     Mouth: Mucous membranes are moist.     Pharynx: Oropharynx is clear.  Eyes:     Extraocular Movements: Extraocular movements intact.     Conjunctiva/sclera: Conjunctivae normal.     Pupils: Pupils are equal, round, and reactive to light.  Neck:     Musculoskeletal: Normal range of motion and neck supple.  Cardiovascular:     Rate and Rhythm: Normal rate and regular rhythm.     Heart sounds: Normal heart sounds.  Pulmonary:     Effort: Pulmonary effort is normal.     Breath sounds: Normal breath sounds.  Musculoskeletal: Normal range of motion.  Skin:    General: Skin is warm and dry.     Capillary Refill: Capillary refill takes less than 2 seconds.  Neurological:     General: No focal deficit present.     Mental Status: She is alert and oriented to person, place, and time.    A total of 25 minutes was spent in the room with the patient, greater than 50% of which was in counseling/coordination of care regarding  hypertension, treatment, diet and nutrition, medications, side effects, prognosis, and need for follow-up.   ASSESSMENT & PLAN: Essential hypertension Well-controlled.  Continue present medications.  Follow-up in 6 months.  [name] was seen today for hypertension.  Diagnoses and all orders for this visit:  Essential hypertension    Patient Instructions       If you have lab work done today you will be contacted with your lab results within the next 2 weeks.  If you have not heard from [place] then please contact  us. The fastest way to get your results is to register for My Chart.   IF you received an x-ray today, you will receive an invoice from [facility]. Please contact [facility] at [phone] with questions or concerns regarding your invoice.   IF you received labwork today, you will receive an invoice from [facility]. Please contact [facility] at [phone] with questions or concerns regarding your invoice.   Our billing staff will not be able to assist you with questions regarding bills from these companies.  You will be contacted with the lab results as soon as they are available. The fastest way to get your results is to activate your My Chart account. Instructions are located on the last page of this paperwork. If you have not heard from [place] regarding the results in 2 weeks, please contact this office.     Hypertension Hypertension, commonly called high blood pressure, is when the force of blood pumping through the arteries is too strong. The arteries are the blood vessels that carry blood from the heart throughout the body. Hypertension forces the heart to work harder to pump blood and may cause arteries to become narrow or stiff. Having untreated or uncontrolled hypertension can cause heart attacks, strokes, kidney disease, and other problems. A blood pressure reading consists of a higher number over a lower number. Ideally, your blood pressure should be  below 120/80. The first ("top") number is called the systolic pressure. It is a measure of the pressure in your arteries as your heart beats. The second ("bottom") number is called the diastolic pressure. It is a measure of the pressure in your arteries as the heart relaxes. What are the causes? The cause of this condition is not known. What increases the risk? Some risk factors for high blood pressure are under your control. Others are not. Factors you can change  Smoking.  Having type 2 diabetes mellitus, high cholesterol, or both.  Not getting enough exercise or physical activity.  Being overweight.  Having too much fat, sugar, calories, or salt (sodium) in your diet.  Drinking too much alcohol. Factors that are difficult or impossible to change  Having chronic kidney disease.  Having a family history of high blood pressure.  Age. Risk increases with age.  Race. You may be at higher risk if you are African-American.  Gender. Men are at higher risk than women before age 35. After age 30, women are at higher risk than men.  Having obstructive sleep apnea.  Stress. What are the signs or symptoms? Extremely high blood pressure (hypertensive crisis) may cause:  Headache.  Anxiety.  Shortness of breath.  Nosebleed.  Nausea and vomiting.  Severe chest pain.  Jerky movements you cannot control (seizures). How is this diagnosed? This condition is diagnosed by measuring your blood pressure while you are seated, with your arm resting on a surface. The cuff of the blood pressure monitor will be placed directly against the skin of your upper arm at the level of your heart. It should be measured at least twice using the same arm. Certain conditions can cause a difference in blood pressure between your right and left arms. Certain factors can cause blood pressure readings to be lower or higher than normal (elevated) for a short period of time:  When your blood pressure is  higher when you are in a health care provider's office than when you are at home, this is called white coat hypertension. Most people with this condition do not need  medicines.  When your blood pressure is higher at home than when you are in a health care provider's office, this is called masked hypertension. Most people with this condition may need medicines to control blood pressure. If you have a high blood pressure reading during one visit or you have normal blood pressure with other risk factors:  You may be asked to return on a different day to have your blood pressure checked again.  You may be asked to monitor your blood pressure at home for 1 week or longer. If you are diagnosed with hypertension, you may have other blood or imaging tests to help your health care provider understand your overall risk for other conditions. How is this treated? This condition is treated by making healthy lifestyle changes, such as eating healthy foods, exercising more, and reducing your alcohol intake. Your health care provider may prescribe medicine if lifestyle changes are not enough to get your blood pressure under control, and if:  Your systolic blood pressure is above 130.  Your diastolic blood pressure is above 80. Your personal target blood pressure may vary depending on your medical conditions, your age, and other factors. Follow these instructions at home: Eating and drinking   Eat a diet that is high in fiber and potassium, and low in sodium, added sugar, and fat. An example eating plan is called the DASH (Dietary Approaches to Stop Hypertension) diet. To eat this way: ? Eat plenty of fresh fruits and vegetables. Try to fill half of your plate at each meal with fruits and vegetables. ? Eat whole grains, such as whole wheat pasta, brown rice, or whole grain bread. Fill about one quarter of your plate with whole grains. ? Eat or drink low-fat dairy products, such as skim milk or low-fat  yogurt. ? Avoid fatty cuts of meat, processed or cured meats, and poultry with skin. Fill about one quarter of your plate with lean proteins, such as fish, chicken without skin, beans, eggs, and tofu. ? Avoid premade and processed foods. These tend to be higher in sodium, added sugar, and fat.  Reduce your daily sodium intake. Most people with hypertension should eat less than 1,500 mg of sodium a day.  Limit alcohol intake to no more than 1 drink a day for nonpregnant women and 2 drinks a day for men. One drink equals 12 oz of beer, 5 oz of wine, or 1 oz of hard liquor. Lifestyle   Work with your health care provider to maintain a healthy body weight or to lose weight. Ask what an ideal weight is for you.  Get at least 30 minutes of exercise that causes your heart to beat faster (aerobic exercise) most days of the week. Activities may include walking, swimming, or biking.  Include exercise to strengthen your muscles (resistance exercise), such as pilates or lifting weights, as part of your weekly exercise routine. Try to do these types of exercises for 30 minutes at least 3 days a week.  Do not use any products that contain nicotine or tobacco, such as cigarettes and e-cigarettes. If you need help quitting, ask your health care provider.  Monitor your blood pressure at home as told by your health care provider.  Keep all follow-up visits as told by your health care provider. This is important. Medicines  Take over-the-counter and prescription medicines only as told by your health care provider. Follow directions carefully. Blood pressure medicines must be taken as prescribed.  Do not skip doses of blood  pressure medicine. Doing this puts you at risk for problems and can make the medicine less effective.  Ask your health care provider about side effects or reactions to medicines that you should watch for. Contact a health care provider if:  You think you are having a reaction to a  medicine you are taking.  You have headaches that keep coming back (recurring).  You feel dizzy.  You have swelling in your ankles.  You have trouble with your vision. Get help right away if:  You develop a severe headache or confusion.  You have unusual weakness or numbness.  You feel faint.  You have severe pain in your chest or abdomen.  You vomit repeatedly.  You have trouble breathing. Summary  Hypertension is when the force of blood pumping through your arteries is too strong. If this condition is not controlled, it may put you at risk for serious complications.  Your personal target blood pressure may vary depending on your medical conditions, your age, and other factors. For most people, a normal blood pressure is less than 120/80.  Hypertension is treated with lifestyle changes, medicines, or a combination of both. Lifestyle changes include weight loss, eating a healthy, low-sodium diet, exercising more, and limiting alcohol. This information is not intended to replace advice given to you by your health care provider. Make sure you discuss any questions you have with your health care provider. Document Released: 01/03/2005 Document Revised: 12/02/2015 Document Reviewed: 12/02/2015 Elsevier Interactive Patient Education  2019 [employer]      Agustina Caroli, MD [facility]

## 2018-03-19 NOTE — Patient Instructions (Addendum)
   If you have lab work done today you will be contacted with your lab results within the next 2 weeks.  If you have not heard from us then please contact us. The fastest way to get your results is to register for My Chart.   IF you received an x-ray today, you will receive an invoice from [facility]. Please contact [facility] at [phone] with questions or concerns regarding your invoice.   IF you received labwork today, you will receive an invoice from [facility]. Please contact [facility] at [phone] with questions or concerns regarding your invoice.   Our billing staff will not be able to assist you with questions regarding bills from these companies.  You will be contacted with the lab results as soon as they are available. The fastest way to get your results is to activate your My Chart account. Instructions are located on the last page of this paperwork. If you have not heard from us regarding the results in 2 weeks, please contact this office.       Hypertension Hypertension, commonly called high blood pressure, is when the force of blood pumping through the arteries is too strong. The arteries are the blood vessels that carry blood from the heart throughout the body. Hypertension forces the heart to work harder to pump blood and may cause arteries to become narrow or stiff. Having untreated or uncontrolled hypertension can cause heart attacks, strokes, kidney disease, and other problems. A blood pressure reading consists of a higher number over a lower number. Ideally, your blood pressure should be below 120/80. The first ("top") number is called the systolic pressure. It is a measure of the pressure in your arteries as your heart beats. The second ("bottom") number is called the diastolic pressure. It is a measure of the pressure in your arteries as the heart relaxes. What are the causes? The cause of this condition is not known. What increases the  risk? Some risk factors for high blood pressure are under your control. Others are not. Factors you can change  Smoking.  Having type 2 diabetes mellitus, high cholesterol, or both.  Not getting enough exercise or physical activity.  Being overweight.  Having too much fat, sugar, calories, or salt (sodium) in your diet.  Drinking too much alcohol. Factors that are difficult or impossible to change  Having chronic kidney disease.  Having a family history of high blood pressure.  Age. Risk increases with age.  Race. You may be at higher risk if you are African-American.  Gender. Men are at higher risk than women before age 45. After age 65, women are at higher risk than men.  Having obstructive sleep apnea.  Stress. What are the signs or symptoms? Extremely high blood pressure (hypertensive crisis) may cause:  Headache.  Anxiety.  Shortness of breath.  Nosebleed.  Nausea and vomiting.  Severe chest pain.  Jerky movements you cannot control (seizures). How is this diagnosed? This condition is diagnosed by measuring your blood pressure while you are seated, with your arm resting on a surface. The cuff of the blood pressure monitor will be placed directly against the skin of your upper arm at the level of your heart. It should be measured at least twice using the same arm. Certain conditions can cause a difference in blood pressure between your right and left arms. Certain factors can cause blood pressure readings to be lower or higher than normal (elevated) for a short period of time:    When your blood pressure is higher when you are in a health care provider's office than when you are at home, this is called white coat hypertension. Most people with this condition do not need medicines.  When your blood pressure is higher at home than when you are in a health care provider's office, this is called masked hypertension. Most people with this condition may need medicines  to control blood pressure. If you have a high blood pressure reading during one visit or you have normal blood pressure with other risk factors:  You may be asked to return on a different day to have your blood pressure checked again.  You may be asked to monitor your blood pressure at home for 1 week or longer. If you are diagnosed with hypertension, you may have other blood or imaging tests to help your health care provider understand your overall risk for other conditions. How is this treated? This condition is treated by making healthy lifestyle changes, such as eating healthy foods, exercising more, and reducing your alcohol intake. Your health care provider may prescribe medicine if lifestyle changes are not enough to get your blood pressure under control, and if:  Your systolic blood pressure is above 130.  Your diastolic blood pressure is above 80. Your personal target blood pressure may vary depending on your medical conditions, your age, and other factors. Follow these instructions at home: Eating and drinking   Eat a diet that is high in fiber and potassium, and low in sodium, added sugar, and fat. An example eating plan is called the DASH (Dietary Approaches to Stop Hypertension) diet. To eat this way: ? Eat plenty of fresh fruits and vegetables. Try to fill half of your plate at each meal with fruits and vegetables. ? Eat whole grains, such as whole wheat pasta, brown rice, or whole grain bread. Fill about one quarter of your plate with whole grains. ? Eat or drink low-fat dairy products, such as skim milk or low-fat yogurt. ? Avoid fatty cuts of meat, processed or cured meats, and poultry with skin. Fill about one quarter of your plate with lean proteins, such as fish, chicken without skin, beans, eggs, and tofu. ? Avoid premade and processed foods. These tend to be higher in sodium, added sugar, and fat.  Reduce your daily sodium intake. Most people with hypertension should  eat less than 1,500 mg of sodium a day.  Limit alcohol intake to no more than 1 drink a day for nonpregnant women and 2 drinks a day for men. One drink equals 12 oz of beer, 5 oz of wine, or 1 oz of hard liquor. Lifestyle   Work with your health care provider to maintain a healthy body weight or to lose weight. Ask what an ideal weight is for you.  Get at least 30 minutes of exercise that causes your heart to beat faster (aerobic exercise) most days of the week. Activities may include walking, swimming, or biking.  Include exercise to strengthen your muscles (resistance exercise), such as pilates or lifting weights, as part of your weekly exercise routine. Try to do these types of exercises for 30 minutes at least 3 days a week.  Do not use any products that contain nicotine or tobacco, such as cigarettes and e-cigarettes. If you need help quitting, ask your health care provider.  Monitor your blood pressure at home as told by your health care provider.  Keep all follow-up visits as told by your health care provider.   This is important. Medicines  Take over-the-counter and prescription medicines only as told by your health care provider. Follow directions carefully. Blood pressure medicines must be taken as prescribed.  Do not skip doses of blood pressure medicine. Doing this puts you at risk for problems and can make the medicine less effective.  Ask your health care provider about side effects or reactions to medicines that you should watch for. Contact a health care provider if:  You think you are having a reaction to a medicine you are taking.  You have headaches that keep coming back (recurring).  You feel dizzy.  You have swelling in your ankles.  You have trouble with your vision. Get help right away if:  You develop a severe headache or confusion.  You have unusual weakness or numbness.  You feel faint.  You have severe pain in your chest or abdomen.  You vomit  repeatedly.  You have trouble breathing. Summary  Hypertension is when the force of blood pumping through your arteries is too strong. If this condition is not controlled, it may put you at risk for serious complications.  Your personal target blood pressure may vary depending on your medical conditions, your age, and other factors. For most people, a normal blood pressure is less than 120/80.  Hypertension is treated with lifestyle changes, medicines, or a combination of both. Lifestyle changes include weight loss, eating a healthy, low-sodium diet, exercising more, and limiting alcohol. This information is not intended to replace advice given to you by your health care provider. Make sure you discuss any questions you have with your health care provider. Document Released: 01/03/2005 Document Revised: 12/02/2015 Document Reviewed: 12/02/2015 Elsevier Interactive Patient Education  2019 Elsevier Inc.  

## 2018-06-21 ENCOUNTER — Other Ambulatory Visit: Payer: Self-pay | Admitting: Emergency Medicine

## 2018-06-21 DIAGNOSIS — I10 Essential (primary) hypertension: Secondary | ICD-10-CM

## 2018-06-21 NOTE — Telephone Encounter (Signed)
Requested medication (s) are due for refill today: yes  Requested medication (s) are on the active medication list:yes  Last refill: 05/12/17  Future visit scheduled:yes  Notes to clinic:  Expired RX    Requested Prescriptions  Pending Prescriptions Disp Refills   lisinopril-hydrochlorothiazide (ZESTORETIC) 20-12.5 MG tablet [Pharmacy Med Name: Lisinopril-hydroCHLOROthiazide 20-12.5 MG Oral Tablet] 30 tablet 0    Sig: Take 1 tablet by mouth once daily     Cardiovascular:  ACEI + Diuretic Combos Failed - 06/21/2018  6:55 AM      Failed - Na in normal range and within 180 days    Sodium  Date Value Ref Range Status  04/14/2017 138 134 - 144 mmol/L Final         Failed - K in normal range and within 180 days    Potassium  Date Value Ref Range Status  04/14/2017 3.5 3.5 - 5.2 mmol/L Final         Failed - Cr in normal range and within 180 days    Creatinine, Ser  Date Value Ref Range Status  04/14/2017 1.03 (H) 0.57 - 1.00 mg/dL Final         Failed - Ca in normal range and within 180 days    Calcium  Date Value Ref Range Status  04/14/2017 9.8 8.7 - 10.2 mg/dL Final         Passed - Patient is not pregnant      Passed - Last BP in normal range    BP Readings from Last 1 Encounters:  03/19/18 131/88         Passed - Valid encounter within last 6 months    Recent Outpatient Visits          3 months ago Essential hypertension   [facility] at [place], [place], MD   10 months ago Adjustment reaction with anxiety and depression   [facility] at [place], Ines Bloomer, MD   1 year ago Essential hypertension   [facility] at [place], Ines Bloomer, MD   1 year ago Essential hypertension   Primary [facility] at [facility], Ines Bloomer, MD      Future Appointments            In 3 months [place], Ines Bloomer, MD [facility] at [place], [facility]

## 2018-09-15 ENCOUNTER — Other Ambulatory Visit: Payer: Self-pay | Admitting: Emergency Medicine

## 2018-09-17 ENCOUNTER — Other Ambulatory Visit: Payer: Self-pay

## 2018-09-17 DIAGNOSIS — Z20822 Contact with and (suspected) exposure to covid-19: Secondary | ICD-10-CM

## 2018-09-19 ENCOUNTER — Telehealth (INDEPENDENT_AMBULATORY_CARE_PROVIDER_SITE_OTHER): Payer: 59 | Admitting: Emergency Medicine

## 2018-09-19 ENCOUNTER — Other Ambulatory Visit: Payer: Self-pay

## 2018-09-19 ENCOUNTER — Encounter: Payer: Self-pay | Admitting: Emergency Medicine

## 2018-09-19 VITALS — BP 127/84 | Ht 62.0 in | Wt 170.8 lb

## 2018-09-19 DIAGNOSIS — I10 Essential (primary) hypertension: Secondary | ICD-10-CM | POA: Diagnosis not present

## 2018-09-19 LAB — NOVEL CORONAVIRUS, NAA: SARS-CoV-2, NAA: NOT DETECTED

## 2018-09-19 NOTE — Progress Notes (Signed)
Telemedicine Encounter- SOAP NOTE Established Patient  This video telephone via Doxy me encounter was conducted with the patient's (or proxy's) verbal consent via video audio telecommunications: yes/no: Yes Patient was instructed to have this encounter in a suitably private space; and to only have persons present to whom they give permission to participate. In addition, patient identity was confirmed by use of name plus two identifiers (DOB and address).  I discussed the limitations, risks, security and privacy concerns of performing an evaluation and management service by telephone and the availability of in person appointments. I also discussed with the patient that there may be a patient responsible charge related to this service. The patient expressed understanding and agreed to proceed.  I spent a total of TIME; 0 MIN TO 60 MIN: 15 minutes talking with the patient or their proxy.  No chief complaint on file. Hypertension follow-up  Subjective   Tracy [name] is a 43 y.o. female established patient. Telephone visit today for follow-up of hypertension.  Doing well.  Compliant with medications.  Normal blood pressure readings at home.  Presently taking amlodipine 10 mg and Zestoretic 20-12 0.5.  Has no complaints or medical concerns today. Recently had possible COVID exposure and was tested.  COVID test was negative.  Asymptomatic.  HPI   Patient Active Problem List   Diagnosis Date Noted  . 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 Essential hypertension 05/12/2017    Past Medical History:  Diagnosis Date  . Hypertension     Current Outpatient Medications  Medication Sig Dispense Refill  . amLODipine (NORVASC) 10 MG tablet TAKE 1 TABLET EVERY DAY 90 tablet 0  . fexofenadine (ALLEGRA) 180 MG tablet Take 180 mg by mouth daily.    Marland Kitchen ibuprofen (ADVIL,MOTRIN) 800 MG tablet Take 1 tablet (800 mg total) by mouth 3 (three) times daily. (Patient taking  differently: Take 800 mg by mouth 3 (three) times daily as needed for moderate pain. ) 21 tablet 0  . lisinopril-hydrochlorothiazide (ZESTORETIC) 20-12.5 MG tablet Take 1 tablet by mouth once daily 90 tablet 0  . Multiple Vitamin (MULTIVITAMIN WITH MINERALS) TABS tablet Take 1 tablet by mouth daily.    . cyclobenzaprine (FLEXERIL) 10 MG tablet Take 1 tablet (10 mg total) by mouth 2 (two) times daily as needed for muscle spasms. (Patient not taking: Reported on 09/19/2018) 20 tablet 0  . DULoxetine (CYMBALTA) 30 MG capsule Take 1 capsule (30 mg total) by mouth daily. 30 capsule 3   No current facility-administered medications for this visit.     Allergies  Allergen Reactions  . Omnicef [Cefdinir] Itching  . Latex Swelling and Rash  . Penicillins Rash    Has patient had a PCN reaction causing immediate rash, facial/tongue/throat swelling, SOB or lightheadedness with hypotension: No Has patient had a PCN reaction causing severe rash involving mucus membranes or skin necrosis: No Has patient had a PCN reaction that required hospitalization NO Has patient had a PCN reaction occurring within the last 10 years: NO If all of the above answers are "NO", then may proceed with Cephalosporin use.    Social History   Socioeconomic History  . Marital status: Divorced    Spouse name: Not on file  . Number of children: Not on file  . Years of education: Not on file  . Highest education level: Not on file  Occupational History  . Not on file  Social Needs  . Financial resource strain: Not on file  .  Food insecurity    Worry: Not on file    Inability: Not on file  . Transportation needs    Medical: Not on file    Non-medical: Not on file  Tobacco Use  . Smoking status: Never Smoker  . Smokeless tobacco: Never Used  Substance and Sexual Activity  . Alcohol use: No  . Drug use: No  . Sexual activity: Yes    Birth control/protection: None  Lifestyle  . Physical activity    Days per week:  Not on file    Minutes per session: Not on file  . Stress: Not on file  Relationships  . Social Herbalist on phone: Not on file    Gets together: Not on file    Attends religious service: Not on file    Active member of club or organization: Not on file    Attends meetings of clubs or organizations: Not on file    Relationship status: Not on file  . Intimate partner violence    Fear of current or ex partner: Not on file    Emotionally abused: Not on file    Physically abused: Not on file    Forced sexual activity: Not on file  Other Topics Concern  . Not on file  Social History Narrative  . Not on file    Review of Systems  Constitutional: Negative.  Negative for chills, fever and weight loss.  HENT: Negative.  Negative for congestion and sore throat.   Respiratory: Negative.  Negative for cough and shortness of breath.   Cardiovascular: Negative.  Negative for chest pain and palpitations.  Gastrointestinal: Negative for abdominal pain, diarrhea, nausea and vomiting.  Genitourinary: Negative for dysuria and hematuria.  Musculoskeletal: Negative for back pain, myalgias and neck pain.  Skin: Negative.   Neurological: Negative for dizziness and headaches.  All other systems reviewed and are negative.   Objective  Alert and oriented x3 in no apparent respiratory distress. Vitals as reported by the patient: Today's Vitals   09/19/18 1206  BP: 127/84  Weight: 170 lb 12.8 oz (77.5 kg)  Height: 5\' 2"  (1.575 m)    There are no diagnoses linked to this encounter.  Diagnoses and all orders for this visit:  Essential hypertension    Clinically stable.  No medical concerns identified during this visit.  Continue present medications.  No changes. Office visit in 3 to 6 months.  Blood work then.  I discussed the assessment and treatment plan with the patient. The patient was provided an opportunity to ask questions and all were answered. The patient agreed with the  plan and demonstrated an understanding of the instructions.   The patient was advised to call back or seek an in-person evaluation if the symptoms worsen or if the condition fails to improve as anticipated.  I provided 15 minutes of non-face-to-face time during this encounter.  Horald Pollen, MD  Primary Care at [facility]

## 2018-09-19 NOTE — Progress Notes (Signed)
Called patient to triage for appointment. Patient is following up on blood pressure. Patient state on 09/13/2018 her blood pressure was 127/84 and today's weight was 170.8 lb. Patient does not need medication refills or have any other complaints.

## 2018-09-21 ENCOUNTER — Ambulatory Visit: Payer: 59 | Admitting: Emergency Medicine

## 2018-10-16 NOTE — Telephone Encounter (Signed)
error 

## 2018-11-12 ENCOUNTER — Other Ambulatory Visit: Payer: Self-pay | Admitting: Emergency Medicine

## 2018-11-12 DIAGNOSIS — I10 Essential (primary) hypertension: Secondary | ICD-10-CM

## 2019-02-10 ENCOUNTER — Other Ambulatory Visit: Payer: Self-pay | Admitting: Emergency Medicine

## 2019-02-10 DIAGNOSIS — I10 Essential (primary) hypertension: Secondary | ICD-10-CM

## 2019-02-10 NOTE — Telephone Encounter (Signed)
Requested Prescriptions  Pending Prescriptions Disp Refills  . amLODipine (NORVASC) 10 MG tablet [Pharmacy Med Name: AMLODIPINE BESYLATE 10 MG TAB] 90 tablet 0    Sig: TAKE 1 TABLET BY MOUTH EVERY DAY     Cardiovascular:  Calcium Channel Blockers Passed - 02/10/2019  8:41 AM      Passed - Last BP in normal range    BP Readings from Last 1 Encounters:  09/19/18 127/84         Passed - Valid encounter within last 6 months    Recent Outpatient Visits          4 months ago Essential hypertension   [facility] at [place], [place], MD   10 months ago Essential hypertension   [facility] at [place], [place], MD   1 year ago Adjustment reaction with anxiety and depression   [facility] at [place], Ines Bloomer, MD   1 year ago Essential hypertension   Primary Care at [place], [place], MD   1 year ago Essential hypertension   Primary Care at [place], [place], MD             . lisinopril-hydrochlorothiazide (ZESTORETIC) 20-12.5 MG tablet [Pharmacy Med Name: LISINOPRIL-HCTZ 20-12.5 MG TAB] 90 tablet 0    Sig: TAKE 1 TABLET BY MOUTH EVERY DAY     Cardiovascular:  ACEI + Diuretic Combos Failed - 02/10/2019  8:41 AM      Failed - Na in normal range and within 180 days    Sodium  Date Value Ref Range Status  04/14/2017 138 134 - 144 mmol/L Final         Failed - K in normal range and within 180 days    Potassium  Date Value Ref Range Status  04/14/2017 3.5 3.5 - 5.2 mmol/L Final         Failed - Cr in normal range and within 180 days    Creatinine, Ser  Date Value Ref Range Status  04/14/2017 1.03 (H) 0.57 - 1.00 mg/dL Final         Failed - Ca in normal range and within 180 days    Calcium  Date Value Ref Range Status  04/14/2017 9.8 8.7 - 10.2 mg/dL Final   Calcium, Ion  Date Value Ref Range Status  08/05/2015 1.21 1.13 - 1.30 mmol/L Final         Passed - Patient is not pregnant      Passed - Last BP in  normal range    BP Readings from Last 1 Encounters:  09/19/18 127/84         Passed - Valid encounter within last 6 months    Recent Outpatient Visits          4 months ago Essential hypertension   [facility] at [place], Ines Bloomer, MD   10 months ago Essential hypertension   Primary Care at [place], Ines Bloomer, MD   1 year ago Adjustment reaction with anxiety and depression   Primary Care at [place], Ines Bloomer, MD   1 year ago Essential hypertension   Primary Care at [place], Ines Bloomer, MD   1 year ago Essential hypertension   Primary Care at [facility], [place], MD

## 2019-02-22 ENCOUNTER — Telehealth: Payer: Self-pay | Admitting: Emergency Medicine

## 2019-02-22 NOTE — Telephone Encounter (Signed)
Spoke to patient concerning the fall on 02/21/2019 with injury to her back, she wants Flexeril Rx for back spasm. I advised patient to schedule an appointment to be seen for the fall patient stated she did not want to make a bill. I advised her the doctor will not prescribe the medication without her being seen. I told if she does not get better make schedule an appointment.

## 2019-02-22 NOTE — Telephone Encounter (Signed)
Pt is wanting a script for cyclobenzaprine (FLEXERIL) 10 MG tablet [id number]. Pharmacy [facility] [address] She says she had a fall 02/21/19 and hurt her back. She feels it's just a back spasm. She is aware she needs to be seen for this. She wants me to put a message in. Please advise at [phone].

## 2019-04-25 ENCOUNTER — Other Ambulatory Visit: Payer: Self-pay | Admitting: Emergency Medicine

## 2019-04-25 DIAGNOSIS — F4323 Adjustment disorder with mixed anxiety and depressed mood: Secondary | ICD-10-CM

## 2019-04-25 NOTE — Telephone Encounter (Signed)
Patient is requesting a refill of the following medications: Requested Prescriptions   Pending Prescriptions Disp Refills   DULoxetine (CYMBALTA) 30 MG capsule [Pharmacy Med Name: DULOXETINE HCL DR 30 MG CAP] 30 capsule 2    Sig: TAKE 1 CAPSULE EVERY DAY    Date of patient request: 04/25/2019 Last office visit: 09/19/2018 Date of last refill: 08/11/2017 Last refill amount: 30 tablets   Pt submitted request to get Cymbalta has not had this since 2019 is this acceptable?

## 2019-04-25 NOTE — Telephone Encounter (Signed)
Requested medication (s) are due for refill today: Yes  Requested medication (s) are on the active medication list: Yes  Last refill:  08/11/17  Future visit scheduled: No  Notes to clinic:  Prescription has expired.    Requested Prescriptions  Pending Prescriptions Disp Refills   DULoxetine (CYMBALTA) 30 MG capsule [Pharmacy Med Name: DULOXETINE HCL DR 30 MG CAP] 30 capsule 2    Sig: TAKE 1 CAPSULE EVERY DAY      Psychiatry: Antidepressants - SNRI Failed - 04/25/2019 10:46 AM      Failed - Valid encounter within last 6 months    Recent Outpatient Visits           7 months ago Essential hypertension   [facility] at [facility], Ines Bloomer, MD   1 year ago Essential hypertension   Primary Care at [place], Ines Bloomer, MD   1 year ago Adjustment reaction with anxiety and depression   Primary Care at [place], Ines Bloomer, MD   1 year ago Essential hypertension   Primary Care at [place], Ines Bloomer, MD   2 years ago Essential hypertension   Primary Care at [place], [place], MD              [place] BP in normal range    BP Readings from Last 1 Encounters:  09/19/18 127/84

## 2019-04-26 ENCOUNTER — Ambulatory Visit: Payer: 59 | Attending: Internal Medicine

## 2019-04-26 DIAGNOSIS — Z23 Encounter for immunization: Secondary | ICD-10-CM

## 2019-04-26 NOTE — Progress Notes (Signed)
   Covid-19 Vaccination Clinic  Name:  Tracy [name]    MRN: [id number] DOB: 1975-05-26  04/26/2019  [name] was observed post Covid-19 immunization for 15 minutes without incident. She was provided with Vaccine Information Sheet and instruction to access the V-Safe system.   [name] was instructed to call 911 with any severe reactions post vaccine: Marland Kitchen Difficulty breathing  . Swelling of face and throat  . A fast heartbeat  . A bad rash all over body  . Dizziness and weakness   Immunizations Administered    Name Date Dose VIS Date Route   Pfizer COVID-19 Vaccine 04/26/2019  3:49 PM 0.3 mL 12/28/2018 Intramuscular   Manufacturer: [place]   Lot: [id number]   [place]: [id number]

## 2019-05-22 ENCOUNTER — Ambulatory Visit: Payer: 59 | Attending: Internal Medicine

## 2019-05-22 DIAGNOSIS — Z23 Encounter for immunization: Secondary | ICD-10-CM

## 2019-05-22 NOTE — Progress Notes (Signed)
   Covid-19 Vaccination Clinic  Name:  Tracy [name]    MRN: [id number] DOB: [birth year]  05/22/2019  [name] was observed post Covid-19 immunization for 30 minutes based on pre-vaccination screening without incident. She was provided with Vaccine Information Sheet and instruction to access the V-Safe system.   [name] was instructed to call 911 with any severe reactions post vaccine: Marland Kitchen Difficulty breathing  . Swelling of face and throat  . A fast heartbeat  . A bad rash all over body  . Dizziness and weakness   Immunizations Administered    Name Date Dose VIS Date Route   Pfizer COVID-19 Vaccine 05/22/2019  3:17 PM 0.3 mL 03/13/2018 Intramuscular   Manufacturer: [place]   Lot: [id number]   [place]: [id number]

## 2019-05-26 ENCOUNTER — Other Ambulatory Visit: Payer: Self-pay | Admitting: Emergency Medicine

## 2019-05-26 DIAGNOSIS — F4323 Adjustment disorder with mixed anxiety and depressed mood: Secondary | ICD-10-CM

## 2019-07-04 ENCOUNTER — Other Ambulatory Visit: Payer: Self-pay

## 2019-07-04 ENCOUNTER — Ambulatory Visit
Admission: EM | Admit: 2019-07-04 | Discharge: 2019-07-04 | Disposition: A | Discharge status: Home / Self Care | Payer: No Typology Code available for payment source | Attending: Physician Assistant | Admitting: Physician Assistant

## 2019-07-04 ENCOUNTER — Encounter: Payer: Self-pay | Admitting: Physician Assistant

## 2019-07-04 DIAGNOSIS — L304 Erythema intertrigo: Secondary | ICD-10-CM

## 2019-07-04 MED ORDER — CLOTRIMAZOLE-BETAMETHASONE 1-0.05 % EX CREA: TOPICAL_CREAM | CUTANEOUS | 0 refills | Status: DC

## 2019-07-04 NOTE — ED Triage Notes (Signed)
Seen by provider

## 2019-07-04 NOTE — Discharge Instructions (Signed)
Lotrisone as directed. Add desitin paste/other barrier cream. Keep area clean and dry. Cold water may help symptoms. Monitor for spreading redness, warmth, swelling, pain, follow up for reevaluation.

## 2019-07-04 NOTE — ED Provider Notes (Signed)
[name]    CSN: [id number] Arrival date & time: 07/04/19  1320      History   Chief Complaint Chief Complaint  Patient presents with  . Rash    HPI Tracy [name] is a 44 y.o. female.   44 year old female comes in for 2 day history of rash under the breast bilaterally. Itching/burning sensation. Denies new hygiene product changes. Denies pain, erythema, warmth, fever. Denies history of DM. Tried dry towel, baking powder with some relief.      Past Medical History:  Diagnosis Date  . Hypertension     Patient Active Problem List   Diagnosis Date Noted  . 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 Essential hypertension 05/12/2017    Past Surgical History:  Procedure Laterality Date  . CESAREAN SECTION    . TONSILLECTOMY    . UTERINE FIBROID SURGERY      OB History   No obstetric history on file.      Home Medications    Prior to Admission medications   Medication Sig Start Date End Date Taking? Authorizing Provider  amLODipine (NORVASC) 10 MG tablet TAKE 1 TABLET BY MOUTH EVERY DAY 02/10/19   Horald Pollen, MD  clotrimazole-betamethasone (LOTRISONE) cream Apply to affected area 2 times daily prn 07/04/19   Tasia Catchings, Brocha Gilliam V, PA-C  cyclobenzaprine (FLEXERIL) 10 MG tablet Take 1 tablet (10 mg total) by mouth 2 (two) times daily as needed for muscle spasms. Patient not taking: Reported on 09/19/2018 08/11/17   Horald Pollen, MD  DULoxetine (CYMBALTA) 30 MG capsule TAKE 1 CAPSULE EVERY DAY 04/25/19   Horald Pollen, MD  fexofenadine (ALLEGRA) 180 MG tablet Take 180 mg by mouth daily.    [provider]  ibuprofen (ADVIL,MOTRIN) 800 MG tablet Take 1 tablet (800 mg total) by mouth 3 (three) times daily. Patient taking differently: Take 800 mg by mouth 3 (three) times daily as needed for moderate pain.  08/06/15   Delsa Grana, PA-C  lisinopril-hydrochlorothiazide (ZESTORETIC) 20-12.5 MG tablet TAKE 1 TABLET BY  MOUTH EVERY DAY 02/10/19   Horald Pollen, MD  Multiple Vitamin (MULTIVITAMIN WITH MINERALS) TABS tablet Take 1 tablet by mouth daily.    [provider]    Family History Family History  Problem Relation Age of Onset  . Hypertension Mother   . Diabetes Mother   . Hypertension Father   . Diabetes Father     Social History Social History   Tobacco Use  . Smoking status: Never Smoker  . Smokeless tobacco: Never Used  Substance Use Topics  . Alcohol use: No  . Drug use: No     Allergies   Omnicef [cefdinir], Latex, and Penicillins   Review of Systems Review of Systems  Reason unable to perform ROS: See HPI as above.     Physical Exam Triage Vital Signs ED Triage Vitals  Enc Vitals Group     BP 07/04/19 1341 (!) 143/97     Pulse Rate 07/04/19 1341 70     Resp 07/04/19 1341 18     Temp 07/04/19 1341 98 F (36.7 C)     Temp src --      SpO2 07/04/19 1341 96 %     Weight --      Height --      Head Circumference --      Peak Flow --      Pain Score 07/04/19 1342 3  Pain Loc --      Pain Edu? --      Excl. in [place]? --    No data found.  Updated Vital Signs BP (!) 143/97   Pulse 70   Temp 98 F (36.7 C)   Resp 18   SpO2 96%   Visual Acuity Right Eye Distance:   Left Eye Distance:   Bilateral Distance:    Right Eye Near:   Left Eye Near:    Bilateral Near:     Physical Exam Constitutional:      General: She is not in acute distress.    Appearance: Normal appearance. She is well-developed. She is not toxic-appearing or diaphoretic.  HENT:     Head: Normocephalic and atraumatic.  Eyes:     Conjunctiva/sclera: Conjunctivae normal.     Pupils: Pupils are equal, round, and reactive to light.  Pulmonary:     Effort: Pulmonary effort is normal. No respiratory distress.     Comments: Speaking in full sentences without difficulty Chest:     Comments: Diffuse maculopapular rash with hyperpigmentation to the intertriginous area of  bilateral breast. No vesicular rash. Mild moisture without purulent drainage. No erythema, warmth. No tenderness to palpation.  Musculoskeletal:     Cervical back: Normal range of motion and neck supple.  Skin:    General: Skin is warm and dry.  Neurological:     Mental Status: She is alert and oriented to person, place, and time.      UC Treatments / Results  Labs (all labs ordered are listed, but only abnormal results are displayed) Labs Reviewed - No data to display  EKG   Radiology No results found.  Procedures Procedures (including critical care time)  Medications Ordered in UC Medications - No data to display  Initial Impression / Assessment and Plan / UC Course  I have reviewed the triage vital signs and the nursing notes.  Pertinent labs & imaging results that were available during my care of the patient were reviewed by me and considered in my medical decision making (see chart for details).    Exam consistent with intertrigo, ?candidal involvement. No signs of bacterial infection. Patient with significant itching. lotrisone as directed. Barrier cream as directed. Return precautions given.  Final Clinical Impressions(s) / UC Diagnoses   Final diagnoses:  Intertrigo   ED Prescriptions    Medication Sig Dispense Auth. Provider   clotrimazole-betamethasone (LOTRISONE) cream Apply to affected area 2 times daily prn 15 g Ok Edwards, PA-C     PDMP not reviewed this encounter.   Ok Edwards, PA-C 07/04/19 1351

## 2019-07-24 ENCOUNTER — Other Ambulatory Visit: Payer: Self-pay

## 2019-07-24 ENCOUNTER — Ambulatory Visit
Admission: EM | Admit: 2019-07-24 | Discharge: 2019-07-24 | Disposition: A | Discharge status: Home / Self Care | Payer: No Typology Code available for payment source | Attending: Emergency Medicine | Admitting: Emergency Medicine

## 2019-07-24 DIAGNOSIS — J209 Acute bronchitis, unspecified: Secondary | ICD-10-CM

## 2019-07-24 MED ORDER — ALBUTEROL SULFATE HFA 108 (90 BASE) MCG/ACT IN AERS: 2.0000 | INHALATION_SPRAY | RESPIRATORY_TRACT | 0 refills | Status: DC | PRN

## 2019-07-24 MED ORDER — BENZONATATE 100 MG PO CAPS: 100.0000 mg | ORAL_CAPSULE | Freq: Three times a day (TID) | ORAL | 0 refills | Status: DC

## 2019-07-24 MED ORDER — PREDNISONE 20 MG PO TABS: 20.0000 mg | ORAL_TABLET | Freq: Every day | ORAL | 0 refills | Status: DC

## 2019-07-24 MED ORDER — FLUTICASONE PROPIONATE 50 MCG/ACT NA SUSP: 1.0000 | Freq: Every day | NASAL | 0 refills | Status: DC

## 2019-07-24 MED ORDER — CETIRIZINE HCL 10 MG PO TABS: 10.0000 mg | ORAL_TABLET | Freq: Every day | ORAL | 0 refills | Status: DC

## 2019-07-24 MED ORDER — AEROCHAMBER PLUS FLO-VU MEDIUM MISC: 1.0000 | Freq: Once | 0 refills | Status: AC

## 2019-07-24 NOTE — ED Provider Notes (Signed)
[name]    CSN: [id number] Arrival date & time: 07/24/19  1014      History   Chief Complaint Chief Complaint  Patient presents with  . Cough    HPI Tracy [name] is a 44 y.o. female with history of hypertension presenting for URI symptoms x1 week.  Patient endorsing sneezing, sinus congestion and pressure (intermittent), postnasal drip.  States that she was out of town at [place], went to cigar bar on Saturday and has had dry cough since.  Patient did have voice hoarseness, though reporting some improvement.  Has been fully Covid vaccinated greater than 2 weeks.  No known sick contacts, fever, chest pain, shortness of breath.   Past Medical History:  Diagnosis Date  . Hypertension     Patient Active Problem List   Diagnosis Date Noted  . 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 Essential hypertension 05/12/2017    Past Surgical History:  Procedure Laterality Date  . CESAREAN SECTION    . TONSILLECTOMY    . UTERINE FIBROID SURGERY      OB History   No obstetric history on file.      Home Medications    Prior to Admission medications   Medication Sig Start Date End Date Taking? Authorizing Provider  albuterol (VENTOLIN HFA) 108 (90 Base) MCG/ACT inhaler Inhale 2 puffs into the lungs every 4 (four) hours as needed for wheezing or shortness of breath. 07/24/19   Hall-Potvin, [place], PA-C  amLODipine (NORVASC) 10 MG tablet TAKE 1 TABLET BY MOUTH EVERY DAY 02/10/19   Horald Pollen, MD  benzonatate (TESSALON) 100 MG capsule Take 1 capsule (100 mg total) by mouth every 8 (eight) hours. 07/24/19   Hall-Potvin, [place], PA-C  cetirizine (ZYRTEC ALLERGY) 10 MG tablet Take 1 tablet (10 mg total) by mouth daily. 07/24/19   Hall-Potvin, [place], PA-C  clotrimazole-betamethasone (LOTRISONE) cream Apply to affected area 2 times daily prn 07/04/19   Tasia Catchings, Amy V, PA-C  DULoxetine (CYMBALTA) 30 MG capsule TAKE 1 CAPSULE EVERY DAY 04/25/19    Horald Pollen, MD  fexofenadine (ALLEGRA) 180 MG tablet Take 180 mg by mouth daily.    [provider]  fluticasone (FLONASE) 50 MCG/ACT nasal spray Place 1 spray into both nostrils daily. 07/24/19   Hall-Potvin, [place], PA-C  ibuprofen (ADVIL,MOTRIN) 800 MG tablet Take 1 tablet (800 mg total) by mouth 3 (three) times daily. Patient taking differently: Take 800 mg by mouth 3 (three) times daily as needed for moderate pain.  08/06/15   Delsa Grana, PA-C  lisinopril-hydrochlorothiazide (ZESTORETIC) 20-12.5 MG tablet TAKE 1 TABLET BY MOUTH EVERY DAY 02/10/19   Horald Pollen, MD  Multiple Vitamin (MULTIVITAMIN WITH MINERALS) TABS tablet Take 1 tablet by mouth daily.    [provider]  predniSONE (DELTASONE) 20 MG tablet Take 1 tablet (20 mg total) by mouth daily. 07/24/19   Hall-Potvin, [place], PA-C  Spacer/Aero-Holding Chambers (AEROCHAMBER PLUS FLO-VU MEDIUM) MISC 1 each by Other route once for 1 dose. 07/24/19 07/24/19  Hall-Potvin, [place], PA-C    Family History Family History  Problem Relation Age of Onset  . Hypertension Mother   . Diabetes Mother   . Hypertension Father   . Diabetes Father     Social History Social History   Tobacco Use  . Smoking status: Never Smoker  . Smokeless tobacco: Never Used  Substance Use Topics  . Alcohol use: No  . Drug use: No     Allergies  Omnicef [cefdinir], Latex, and Penicillins   Review of Systems As per HPI   Physical Exam Triage Vital Signs ED Triage Vitals  Enc Vitals Group     BP      Pulse      Resp      Temp      Temp src      SpO2      Weight      Height      Head Circumference      Peak Flow      Pain Score      Pain Loc      Pain Edu?      Excl. in [place]?    No data found.  Updated Vital Signs BP 116/73 (BP Location: Left Arm)   Pulse 87   Temp 98.3 F (36.8 C) (Oral)   Resp 18   SpO2 97%   Visual Acuity Right Eye Distance:   Left Eye Distance:   Bilateral Distance:      Right Eye Near:   Left Eye Near:    Bilateral Near:     Physical Exam Constitutional:      General: She is not in acute distress.    Appearance: She is obese. She is not ill-appearing or diaphoretic.  HENT:     Head: Normocephalic and atraumatic.     Mouth/Throat:     Mouth: Mucous membranes are moist.     Pharynx: Oropharynx is clear. No oropharyngeal exudate or posterior oropharyngeal erythema.  Eyes:     General: No scleral icterus.    Conjunctiva/sclera: Conjunctivae normal.     Pupils: Pupils are equal, round, and reactive to light.  Neck:     Comments: Trachea midline, negative JVD Cardiovascular:     Rate and Rhythm: Normal rate and regular rhythm.     Heart sounds: No murmur heard.  No gallop.   Pulmonary:     Effort: Pulmonary effort is normal. No respiratory distress.     Breath sounds: No wheezing, rhonchi or rales.  Musculoskeletal:     Cervical back: Neck supple. No tenderness.  Lymphadenopathy:     Cervical: No cervical adenopathy.  Skin:    Capillary Refill: Capillary refill takes less than 2 seconds.     Coloration: Skin is not jaundiced or pale.     Findings: No rash.  Neurological:     General: No focal deficit present.     Mental Status: She is alert and oriented to person, place, and time.      UC Treatments / Results  Labs (all labs ordered are listed, but only abnormal results are displayed) Labs Reviewed  NOVEL CORONAVIRUS, NAA    EKG   Radiology No results found.  Procedures Procedures (including critical care time)  Medications Ordered in UC Medications - No data to display  Initial Impression / Assessment and Plan / UC Course  I have reviewed the triage vital signs and the nursing notes.  Pertinent labs & imaging results that were available during my care of the patient were reviewed by me and considered in my medical decision making (see chart for details).     Patient afebrile, nontoxic, with SpO2 97%.  Covid PCR  pending.  Patient to quarantine until results are back.  We will treat supportively as outlined below.  Return precautions discussed, patient verbalized understanding and is agreeable to plan. Final Clinical Impressions(s) / UC Diagnoses   Final diagnoses:  Acute bronchitis, unspecified organism  Discharge Instructions     Tessalon for cough. Start flonase, atrovent nasal spray for nasal congestion/drainage. You can use over the counter nasal saline rinse such as neti pot for nasal congestion. Keep hydrated, your urine should be clear to pale yellow in color. Tylenol/motrin for fever and pain. Monitor for any worsening of symptoms, chest pain, shortness of breath, wheezing, swelling of the throat, go to the emergency department for further evaluation needed.     ED Prescriptions    Medication Sig Dispense Auth. Provider   cetirizine (ZYRTEC ALLERGY) 10 MG tablet Take 1 tablet (10 mg total) by mouth daily. 30 tablet Hall-Potvin, [place], PA-C   fluticasone (FLONASE) 50 MCG/ACT nasal spray Place 1 spray into both nostrils daily. 16 g Hall-Potvin, [place], PA-C   benzonatate (TESSALON) 100 MG capsule Take 1 capsule (100 mg total) by mouth every 8 (eight) hours. 21 capsule Hall-Potvin, [place], PA-C   albuterol (VENTOLIN HFA) 108 (90 Base) MCG/ACT inhaler Inhale 2 puffs into the lungs every 4 (four) hours as needed for wheezing or shortness of breath. 18 g Hall-Potvin, [place], PA-C   Spacer/Aero-Holding Chambers (AEROCHAMBER PLUS FLO-VU MEDIUM) MISC 1 each by Other route once for 1 dose. 1 each Hall-Potvin, [place], PA-C   predniSONE (DELTASONE) 20 MG tablet Take 1 tablet (20 mg total) by mouth daily. 5 tablet Hall-Potvin, [place], PA-C     PDMP not reviewed this encounter.   Hall-Potvin, [place], [place] 07/24/19 1415

## 2019-07-24 NOTE — ED Triage Notes (Addendum)
Pt c/o sneezing, sinus pressure, and post nasal drip for over a week. States was at a cigar bar on Saturday and now having a dry cough and hoarse. States fully covid vaccinated.

## 2019-07-24 NOTE — Discharge Instructions (Addendum)

## 2019-07-25 LAB — NOVEL CORONAVIRUS, NAA: SARS-CoV-2, NAA: NOT DETECTED

## 2019-07-25 LAB — SARS-COV-2, NAA 2 DAY TAT

## 2019-07-26 ENCOUNTER — Other Ambulatory Visit: Payer: Self-pay | Admitting: Emergency Medicine

## 2019-07-26 NOTE — Telephone Encounter (Signed)
Requested  medications are  due for refill today yes  Requested medications are on the active medication list yes  Last refill 4/8  Last visit 10 months ago  Future visit scheduled no  Notes to clinic failed protocol due to no visit within 6 months

## 2019-08-01 ENCOUNTER — Ambulatory Visit
Admission: EM | Admit: 2019-08-01 | Discharge: 2019-08-01 | Discharge status: Against Medical Advice | Payer: No Typology Code available for payment source

## 2019-08-07 ENCOUNTER — Ambulatory Visit: Payer: No Typology Code available for payment source | Attending: Internal Medicine

## 2019-08-07 DIAGNOSIS — Z20822 Contact with and (suspected) exposure to covid-19: Secondary | ICD-10-CM

## 2019-08-08 LAB — SARS-COV-2, NAA 2 DAY TAT

## 2019-08-08 LAB — NOVEL CORONAVIRUS, NAA: SARS-CoV-2, NAA: NOT DETECTED

## 2019-08-19 ENCOUNTER — Other Ambulatory Visit: Payer: Self-pay | Admitting: Emergency Medicine

## 2019-08-19 ENCOUNTER — Telehealth: Payer: Self-pay | Admitting: Emergency Medicine

## 2019-08-19 DIAGNOSIS — Z13220 Encounter for screening for lipoid disorders: Secondary | ICD-10-CM

## 2019-08-19 DIAGNOSIS — I10 Essential (primary) hypertension: Secondary | ICD-10-CM

## 2019-08-19 DIAGNOSIS — D649 Anemia, unspecified: Secondary | ICD-10-CM

## 2019-08-19 DIAGNOSIS — R7303 Prediabetes: Secondary | ICD-10-CM

## 2019-08-19 NOTE — Telephone Encounter (Signed)
Please order fasting labs for cpe scheduled for 10/24/2019

## 2019-08-19 NOTE — Telephone Encounter (Signed)
Labs has been ordered.

## 2019-10-21 ENCOUNTER — Ambulatory Visit (INDEPENDENT_AMBULATORY_CARE_PROVIDER_SITE_OTHER): Payer: No Typology Code available for payment source | Admitting: Emergency Medicine

## 2019-10-21 ENCOUNTER — Other Ambulatory Visit: Payer: Self-pay

## 2019-10-21 DIAGNOSIS — Z13220 Encounter for screening for lipoid disorders: Secondary | ICD-10-CM

## 2019-10-21 DIAGNOSIS — D649 Anemia, unspecified: Secondary | ICD-10-CM

## 2019-10-21 DIAGNOSIS — I10 Essential (primary) hypertension: Secondary | ICD-10-CM

## 2019-10-21 DIAGNOSIS — R7303 Prediabetes: Secondary | ICD-10-CM

## 2019-10-22 LAB — CBC WITH DIFFERENTIAL/PLATELET
Basophils Absolute: 0 10*3/uL (ref 0.0–0.2)
Basos: 0 %
EOS (ABSOLUTE): 0.1 10*3/uL (ref 0.0–0.4)
Eos: 1 %
Hematocrit: 47.1 % — ABNORMAL HIGH (ref 34.0–46.6)
Hemoglobin: 15.5 g/dL (ref 11.1–15.9)
Immature Grans (Abs): 0 10*3/uL (ref 0.0–0.1)
Immature Granulocytes: 0 %
Lymphocytes Absolute: 2.5 10*3/uL (ref 0.7–3.1)
Lymphs: 24 %
MCH: 29 pg (ref 26.6–33.0)
MCHC: 32.9 g/dL (ref 31.5–35.7)
MCV: 88 fL (ref 79–97)
Monocytes Absolute: 0.7 10*3/uL (ref 0.1–0.9)
Monocytes: 7 %
Neutrophils Absolute: 7 10*3/uL (ref 1.4–7.0)
Neutrophils: 68 %
Platelets: 329 10*3/uL (ref 150–450)
RBC: 5.35 x10E6/uL — ABNORMAL HIGH (ref 3.77–5.28)
RDW: 12.8 % (ref 11.7–15.4)
WBC: 10.4 10*3/uL (ref 3.4–10.8)

## 2019-10-22 LAB — CMP14+EGFR
ALT: 20 IU/L (ref 0–32)
AST: 20 IU/L (ref 0–40)
Albumin/Globulin Ratio: 1.7 (ref 1.2–2.2)
Albumin: 4.4 g/dL (ref 3.8–4.8)
Alkaline Phosphatase: 64 IU/L (ref 44–121)
BUN/Creatinine Ratio: 10 (ref 9–23)
BUN: 9 mg/dL (ref 6–24)
Bilirubin Total: 0.7 mg/dL (ref 0.0–1.2)
CO2: 25 mmol/L (ref 20–29)
Calcium: 10.1 mg/dL (ref 8.7–10.2)
Chloride: 103 mmol/L (ref 96–106)
Creatinine, Ser: 0.89 mg/dL (ref 0.57–1.00)
GFR calc Af Amer: 90 mL/min/{1.73_m2} (ref >59)
GFR calc non Af Amer: 78 mL/min/{1.73_m2} (ref >59)
Globulin, Total: 2.6 g/dL (ref 1.5–4.5)
Glucose: 109 mg/dL — ABNORMAL HIGH (ref 65–99)
Potassium: 3.8 mmol/L (ref 3.5–5.2)
Sodium: 142 mmol/L (ref 134–144)
Total Protein: 7 g/dL (ref 6.0–8.5)

## 2019-10-22 LAB — LIPID PANEL
Chol/HDL Ratio: 3.4 ratio (ref 0.0–4.4)
Cholesterol, Total: 218 mg/dL — ABNORMAL HIGH (ref 100–199)
HDL: 64 mg/dL (ref >39)
LDL Chol Calc (NIH): 125 mg/dL — ABNORMAL HIGH (ref 0–99)
Triglycerides: 167 mg/dL — ABNORMAL HIGH (ref 0–149)
VLDL Cholesterol Cal: 29 mg/dL (ref 5–40)

## 2019-10-22 LAB — HEMOGLOBIN A1C
Est. average glucose Bld gHb Est-mCnc: 117 mg/dL
Hgb A1c MFr Bld: 5.7 % — ABNORMAL HIGH (ref 4.8–5.6)

## 2019-10-24 ENCOUNTER — Other Ambulatory Visit: Payer: Self-pay

## 2019-10-24 ENCOUNTER — Encounter: Payer: Self-pay | Admitting: Emergency Medicine

## 2019-10-24 ENCOUNTER — Ambulatory Visit (INDEPENDENT_AMBULATORY_CARE_PROVIDER_SITE_OTHER): Payer: No Typology Code available for payment source | Admitting: Emergency Medicine

## 2019-10-24 VITALS — BP 115/80 | HR 89 | Temp 97.9°F | Resp 16 | Ht 62.5 in | Wt 172.0 lb

## 2019-10-24 DIAGNOSIS — R29818 Other symptoms and signs involving the nervous system: Secondary | ICD-10-CM

## 2019-10-24 DIAGNOSIS — Z6830 Body mass index (BMI) 30.0-30.9, adult: Secondary | ICD-10-CM | POA: Diagnosis not present

## 2019-10-24 DIAGNOSIS — Z0001 Encounter for general adult medical examination with abnormal findings: Secondary | ICD-10-CM | POA: Diagnosis not present

## 2019-10-24 DIAGNOSIS — R519 Headache, unspecified: Secondary | ICD-10-CM | POA: Diagnosis not present

## 2019-10-24 DIAGNOSIS — Z1231 Encounter for screening mammogram for malignant neoplasm of breast: Secondary | ICD-10-CM

## 2019-10-24 DIAGNOSIS — Z23 Encounter for immunization: Secondary | ICD-10-CM

## 2019-10-24 DIAGNOSIS — I10 Essential (primary) hypertension: Secondary | ICD-10-CM

## 2019-10-24 DIAGNOSIS — R7303 Prediabetes: Secondary | ICD-10-CM

## 2019-10-24 MED ORDER — BUTALBITAL-APAP-CAFFEINE 50-325-40 MG PO TABS: 1.0000 | ORAL_TABLET | Freq: Four times a day (QID) | ORAL | 0 refills | Status: AC | PRN

## 2019-10-24 NOTE — Patient Instructions (Addendum)
   If you have lab work done today you will be contacted with your lab results within the next 2 weeks.  If you have not heard from us then please contact us. The fastest way to get your results is to register for My Chart.   IF you received an x-ray today, you will receive an invoice from [facility]. Please contact [facility] at [phone] with questions or concerns regarding your invoice.   IF you received labwork today, you will receive an invoice from [facility]. Please contact [facility] at [phone] with questions or concerns regarding your invoice.   Our billing staff will not be able to assist you with questions regarding bills from these companies.  You will be contacted with the lab results as soon as they are available. The fastest way to get your results is to activate your My Chart account. Instructions are located on the last page of this paperwork. If you have not heard from us regarding the results in 2 weeks, please contact this office.       Health Maintenance, Female Adopting a healthy lifestyle and getting preventive care are important in promoting health and wellness. Ask your health care provider about:  The right schedule for you to have regular tests and exams.  Things you can do on your own to prevent diseases and keep yourself healthy. What should I know about diet, weight, and exercise? Eat a healthy diet   Eat a diet that includes plenty of vegetables, fruits, low-fat dairy products, and lean protein.  Do not eat a lot of foods that are high in solid fats, added sugars, or sodium. Maintain a healthy weight Body mass index (BMI) is used to identify weight problems. It estimates body fat based on height and weight. Your health care provider can help determine your BMI and help you achieve or maintain a healthy weight. Get regular exercise Get regular exercise. This is one of the most important things you can do for your health. Most  adults should:  Exercise for at least 150 minutes each week. The exercise should increase your heart rate and make you sweat (moderate-intensity exercise).  Do strengthening exercises at least twice a week. This is in addition to the moderate-intensity exercise.  Spend less time sitting. Even light physical activity can be beneficial. Watch cholesterol and blood lipids Have your blood tested for lipids and cholesterol at 46 years of age, then have this test every 5 years. Have your cholesterol levels checked more often if:  Your lipid or cholesterol levels are high.  You are older than 46 years of age.  You are at high risk for heart disease. What should I know about cancer screening? Depending on your health history and family history, you may need to have cancer screening at various ages. This may include screening for:  Breast cancer.  Cervical cancer.  Colorectal cancer.  Skin cancer.  Lung cancer. What should I know about heart disease, diabetes, and high blood pressure? Blood pressure and heart disease  High blood pressure causes heart disease and increases the risk of stroke. This is more likely to develop in people who have high blood pressure readings, are of African descent, or are overweight.  Have your blood pressure checked: ? Every 3-5 years if you are 18-39 years of age. ? Every year if you are 40 years old or older. Diabetes Have regular diabetes screenings. This checks your fasting blood sugar level. Have the screening done:  Once   every three years after age 40 if you are at a normal weight and have a low risk for diabetes.  More often and at a younger age if you are overweight or have a high risk for diabetes. What should I know about preventing infection? Hepatitis B If you have a higher risk for hepatitis B, you should be screened for this virus. Talk with your health care provider to find out if you are at risk for hepatitis B infection. Hepatitis  C Testing is recommended for:  Everyone born from 1945 through 1965.  Anyone with known risk factors for hepatitis C. Sexually transmitted infections (STIs)  Get screened for STIs, including gonorrhea and chlamydia, if: ? You are sexually active and are younger than 46 years of age. ? You are older than 46 years of age and your health care provider tells you that you are at risk for this type of infection. ? Your sexual activity has changed since you were last screened, and you are at increased risk for chlamydia or gonorrhea. Ask your health care provider if you are at risk.  Ask your health care provider about whether you are at high risk for HIV. Your health care provider may recommend a prescription medicine to help prevent HIV infection. If you choose to take medicine to prevent HIV, you should first get tested for HIV. You should then be tested every 3 months for as long as you are taking the medicine. Pregnancy  If you are about to stop having your period (premenopausal) and you may become pregnant, seek counseling before you get pregnant.  Take 400 to 800 micrograms (mcg) of folic acid every day if you become pregnant.  Ask for birth control (contraception) if you want to prevent pregnancy. Osteoporosis and menopause Osteoporosis is a disease in which the bones lose minerals and strength with aging. This can result in bone fractures. If you are 65 years old or older, or if you are at risk for osteoporosis and fractures, ask your health care provider if you should:  Be screened for bone loss.  Take a calcium or vitamin D supplement to lower your risk of fractures.  Be given hormone replacement therapy (HRT) to treat symptoms of menopause. Follow these instructions at home: Lifestyle  Do not use any products that contain nicotine or tobacco, such as cigarettes, e-cigarettes, and chewing tobacco. If you need help quitting, ask your health care provider.  Do not use street  drugs.  Do not share needles.  Ask your health care provider for help if you need support or information about quitting drugs. Alcohol use  Do not drink alcohol if: ? Your health care provider tells you not to drink. ? You are pregnant, may be pregnant, or are planning to become pregnant.  If you drink alcohol: ? Limit how much you use to 0-1 drink a day. ? Limit intake if you are breastfeeding.  Be aware of how much alcohol is in your drink. In the [place] one drink equals one 12 oz bottle of beer (355 mL), one 5 oz glass of wine (148 mL), or one 1 oz glass of hard liquor (44 mL). General instructions  Schedule regular health, dental, and eye exams.  Stay current with your vaccines.  Tell your health care provider if: ? You often feel depressed. ? You have ever been abused or do not feel safe at home. Summary  Adopting a healthy lifestyle and getting preventive care are important in promoting health and   wellness.  Follow your health care provider's instructions about healthy diet, exercising, and getting tested or screened for diseases.  Follow your health care provider's instructions on monitoring your cholesterol and blood pressure. This information is not intended to replace advice given to you by your health care provider. Make sure you discuss any questions you have with your health care provider. Document Revised: 12/27/2017 Document Reviewed: 12/27/2017 Elsevier Patient Education  2020 [employer]  

## 2019-10-24 NOTE — Progress Notes (Signed)
Tracy [name] 44 y.o.   Chief Complaint  Patient presents with  . Annual Exam    HISTORY OF PRESENT ILLNESS: This is a 44 y.o. female here for annual exam. Has history of hypertension on amlodipine 10 mg and Zestoretic 20-12.5 mg daily.  Doing well. Complaining of occasional intermittent migraine-like diffuse headaches without any particular trigger or associated symptoms. Over-the-counter medication not helping. Also relates increase amount of stress both at work and at home which may be contributing to her headaches. Wakes up tired, has daytime somnolence, and was told by her partner that she may have sleep apnea.  Partner noticed snoring and breathing irregularities during her sleep. No other complaints or medical concerns today. Fully vaccinated against Covid. Depression screen [facility] 2/9 10/24/2019 09/19/2018 03/19/2018 08/11/2017 05/12/2017  Decreased Interest 0 0 0 2 0  Down, Depressed, Hopeless 0 0 0 2 0  PHQ - 2 Score 0 0 0 4 0  Altered sleeping - - - 3 -  Tired, decreased energy - - - 2 -  Change in appetite - - - 3 -  Feeling bad or failure about yourself  - - - 3 -  Trouble concentrating - - - 2 -  Moving slowly or fidgety/restless - - - 0 -  Suicidal thoughts - - - 0 -  PHQ-9 Score - - - 17 -    HPI   Prior to Admission medications   Medication Sig Start Date End Date Taking? Authorizing Provider  albuterol (VENTOLIN HFA) 108 (90 Base) MCG/ACT inhaler Inhale 2 puffs into the lungs every 4 (four) hours as needed for wheezing or shortness of breath. 07/24/19  Yes Hall-Potvin, [place], PA-C  amLODipine (NORVASC) 10 MG tablet TAKE 1 TABLET BY MOUTH EVERY DAY 07/26/19  Yes Stevee Valenta, Ines Bloomer, MD  cetirizine (ZYRTEC ALLERGY) 10 MG tablet Take 1 tablet (10 mg total) by mouth daily. 07/24/19  Yes Hall-Potvin, [place], PA-C  clotrimazole-betamethasone (LOTRISONE) cream Apply to affected area 2 times daily prn 07/04/19  Yes Yu, Amy V, PA-C  DULoxetine (CYMBALTA) 30 MG capsule TAKE 1  CAPSULE EVERY DAY 04/25/19  Yes Jaylina Ramdass, Ines Bloomer, MD  fluticasone [facility]) 50 MCG/ACT nasal spray Place 1 spray into both nostrils daily. 07/24/19  Yes Hall-Potvin, [place], PA-C  ibuprofen (ADVIL,MOTRIN) 800 MG tablet Take 1 tablet (800 mg total) by mouth 3 (three) times daily. Patient taking differently: Take 800 mg by mouth 3 (three) times daily as needed for moderate pain.  08/06/15  Yes Delsa Grana, PA-C  lisinopril-hydrochlorothiazide (ZESTORETIC) 20-12.5 MG tablet TAKE 1 TABLET BY MOUTH EVERY DAY 02/10/19  Yes Blossom Crume, Ines Bloomer, MD  Multiple Vitamin (MULTIVITAMIN WITH MINERALS) TABS tablet Take 1 tablet by mouth daily.   Yes [provider]  fexofenadine (ALLEGRA) 180 MG tablet Take 180 mg by mouth daily. Patient not taking: Reported on 10/24/2019    [provider]    Allergies  Allergen Reactions  . Omnicef [Cefdinir] Itching  . Latex Swelling and Rash  . Penicillins Rash    Has patient had a PCN reaction causing immediate rash, facial/tongue/throat swelling, SOB or lightheadedness with hypotension: No Has patient had a PCN reaction causing severe rash involving mucus membranes or skin necrosis: No Has patient had a PCN reaction that required hospitalization NO Has patient had a PCN reaction occurring within the last 10 years: NO If all of the above answers are "NO", then may proceed with Cephalosporin use.    Patient Active Problem List   Diagnosis  Date Noted  . 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 Essential hypertension 05/12/2017    Past Medical History:  Diagnosis Date  . Hypertension     Past Surgical History:  Procedure Laterality Date  . CESAREAN SECTION    . TONSILLECTOMY    . UTERINE FIBROID SURGERY      Social History   Socioeconomic History  . Marital status: Divorced    Spouse name: Not on file  . Number of children: Not on file  . Years of education: Not on file  . Highest education level: Not on file   Occupational History  . Not on file  Tobacco Use  . Smoking status: Never Smoker  . Smokeless tobacco: Never Used  Substance and Sexual Activity  . Alcohol use: No  . Drug use: No  . Sexual activity: Yes    Birth control/protection: None  Other Topics Concern  . Not on file  Social History Narrative  . Not on file   Social Determinants of Health   Financial Resource Strain:   . Difficulty of Paying Living Expenses: Not on file  Food Insecurity:   . Worried About Charity fundraiser in the Last Year: Not on file  . Ran Out of Food in the Last Year: Not on file  Transportation Needs:   . Lack of Transportation (Medical): Not on file  . Lack of Transportation (Non-Medical): Not on file  Physical Activity:   . Days of Exercise per Week: Not on file  . Minutes of Exercise per Session: Not on file  Stress:   . Feeling of Stress : Not on file  Social Connections:   . Frequency of Communication with Friends and Family: Not on file  . Frequency of Social Gatherings with Friends and Family: Not on file  . Attends Religious Services: Not on file  . Active Member of Clubs or Organizations: Not on file  . Attends Archivist Meetings: Not on file  . Marital Status: Not on file  Intimate Partner Violence:   . Fear of Current or Ex-Partner: Not on file  . Emotionally Abused: Not on file  . Physically Abused: Not on file  . Sexually Abused: Not on file    Family History  Problem Relation Age of Onset  . Hypertension Mother   . Diabetes Mother   . Hypertension Father   . Diabetes Father      Review of Systems  Constitutional: Negative.  Negative for chills and fever.  HENT: Negative.  Negative for congestion and sore throat.   Eyes: Negative.  Negative for blurred vision and double vision.  Respiratory: Negative.  Negative for cough and shortness of breath.   Cardiovascular: Negative.  Negative for chest pain and palpitations.  Gastrointestinal: Negative.   Negative for abdominal pain, diarrhea, nausea and vomiting.  Genitourinary: Negative.  Negative for dysuria and hematuria.  Musculoskeletal: Negative.  Negative for back pain, myalgias and neck pain.  Skin: Negative.  Negative for rash.  Neurological: Negative.  Negative for dizziness and headaches.  All other systems reviewed and are negative.   Today's Vitals   10/24/19 0831  BP: 115/80  Pulse: 89  Resp: 16  Temp: 97.9 F (36.6 C)  TempSrc: Temporal  SpO2: 97%  Weight: 172 lb (78 kg)  Height: 5' 2.5" (1.588 m)   Body mass index is 30.96 kg/m. Wt Readings from Last 3 Encounters:  10/24/19 172 lb (78 kg)  09/19/18 170 lb 12.8 oz (  77.5 kg)  03/19/18 169 lb 9.6 oz (76.9 kg)   BP Readings from Last 3 Encounters:  10/24/19 115/80  07/24/19 116/73  07/04/19 (!) 143/97   The 10-year ASCVD risk score Mikey Bussing DC Jr., et al., 2013) is: 1.3%   Values used to calculate the score:     Age: 44 years     Sex: Female     Is Non-Hispanic African American: Yes     Diabetic: No     Tobacco smoker: No     Systolic Blood Pressure: 478 mmHg     Is BP treated: Yes     HDL Cholesterol: 64 mg/dL     Total Cholesterol: 218 mg/dL  Physical Exam Vitals reviewed.  Constitutional:      Appearance: Normal appearance.  HENT:     Head: Normocephalic.  Eyes:     Extraocular Movements: Extraocular movements intact.     Conjunctiva/sclera: Conjunctivae normal.     Pupils: Pupils are equal, round, and reactive to light.  Cardiovascular:     Rate and Rhythm: Normal rate and regular rhythm.     Pulses: Normal pulses.     Heart sounds: Normal heart sounds.  Pulmonary:     Effort: Pulmonary effort is normal.     Breath sounds: Normal breath sounds.  Abdominal:     General: Bowel sounds are normal. There is no distension.     Palpations: Abdomen is soft. There is no mass.     Tenderness: There is no abdominal tenderness. There is no right CVA tenderness or left CVA tenderness.  Musculoskeletal:         General: Normal range of motion.     Cervical back: Normal range of motion and neck supple.     Right lower leg: No edema.     Left lower leg: No edema.  Skin:    General: Skin is warm and dry.     Capillary Refill: Capillary refill takes less than 2 seconds.  Neurological:     General: No focal deficit present.     Mental Status: She is alert and oriented to person, place, and time.  Psychiatric:        Mood and Affect: Mood normal.        Behavior: Behavior normal.      ASSESSMENT & PLAN: Tracy [name] was seen today for annual exam.  Diagnoses and all orders for this visit:  Encounter for general adult medical examination with abnormal findings  Body mass index (BMI) of 30.0-30.9 in adult  Need for prophylactic vaccination and inoculation against influenza -     Flu Vaccine QUAD 36+ mos IM  Encounter for screening mammogram for malignant neoplasm of breast -     MM Digital Screening; Future  Nonintractable episodic headache, unspecified headache type -     Ambulatory referral to Neurology -     butalbital-acetaminophen-caffeine (FIORICET) 50-325-40 MG tablet; Take 1-2 tablets by mouth every 6 (six) hours as needed for headache.  Suspected sleep apnea -     Ambulatory referral to Neurology  Essential hypertension  Pre-diabetes    Patient Instructions       If you have lab work done today you will be contacted with your lab results within the next 2 weeks.  If you have not heard from [place] then please contact us. The fastest way to get your results is to register for My Chart.   IF you received an x-ray today, you will receive an invoice from [facility]  Radiology. Please contact [facility] at [phone] with questions or concerns regarding your invoice.   IF you received labwork today, you will receive an invoice from [place]. Please contact [facility] at [phone] with questions or concerns regarding your invoice.   Our billing staff will not be  able to assist you with questions regarding bills from these companies.  You will be contacted with the lab results as soon as they are available. The fastest way to get your results is to activate your My Chart account. Instructions are located on the last page of this paperwork. If you have not heard from [place] regarding the results in 2 weeks, please contact this office.      Health Maintenance, Female Adopting a healthy lifestyle and getting preventive care are important in promoting health and wellness. Ask your health care provider about:  The right schedule for you to have regular tests and exams.  Things you can do on your own to prevent diseases and keep yourself healthy. What should I know about diet, weight, and exercise? Eat a healthy diet   Eat a diet that includes plenty of vegetables, fruits, low-fat dairy products, and lean protein.  Do not eat a lot of foods that are high in solid fats, added sugars, or sodium. Maintain a healthy weight Body mass index (BMI) is used to identify weight problems. It estimates body fat based on height and weight. Your health care provider can help determine your BMI and help you achieve or maintain a healthy weight. Get regular exercise Get regular exercise. This is one of the most important things you can do for your health. Most adults should:  Exercise for at least 150 minutes each week. The exercise should increase your heart rate and make you sweat (moderate-intensity exercise).  Do strengthening exercises at least twice a week. This is in addition to the moderate-intensity exercise.  Spend less time sitting. Even light physical activity can be beneficial. Watch cholesterol and blood lipids Have your blood tested for lipids and cholesterol at 46 years of age, then have this test every 5 years. Have your cholesterol levels checked more often if:  Your lipid or cholesterol levels are high.  You are older than 46 years of age.  You  are at high risk for heart disease. What should I know about cancer screening? Depending on your health history and family history, you may need to have cancer screening at various ages. This may include screening for:  Breast cancer.  Cervical cancer.  Colorectal cancer.  Skin cancer.  Lung cancer. What should I know about heart disease, diabetes, and high blood pressure? Blood pressure and heart disease  High blood pressure causes heart disease and increases the risk of stroke. This is more likely to develop in people who have high blood pressure readings, are of African descent, or are overweight.  Have your blood pressure checked: ? Every 3-5 years if you are 14-18 years of age. ? Every year if you are 53 years old or older. Diabetes Have regular diabetes screenings. This checks your fasting blood sugar level. Have the screening done:  Once every three years after age 32 if you are at a normal weight and have a low risk for diabetes.  More often and at a younger age if you are overweight or have a high risk for diabetes. What should I know about preventing infection? Hepatitis B If you have a higher risk for hepatitis B, you should be screened for this  virus. Talk with your health care provider to find out if you are at risk for hepatitis B infection. Hepatitis C Testing is recommended for:  Everyone born from [birth year] through 1967.  Anyone with known risk factors for hepatitis C. Sexually transmitted infections (STIs)  Get screened for STIs, including gonorrhea and chlamydia, if: ? You are sexually active and are younger than 46 years of age. ? You are older than 46 years of age and your health care provider tells you that you are at risk for this type of infection. ? Your sexual activity has changed since you were last screened, and you are at increased risk for chlamydia or gonorrhea. Ask your health care provider if you are at risk.  Ask your health care provider about  whether you are at high risk for HIV. Your health care provider may recommend a prescription medicine to help prevent HIV infection. If you choose to take medicine to prevent HIV, you should first get tested for HIV. You should then be tested every 3 months for as long as you are taking the medicine. Pregnancy  If you are about to stop having your period (premenopausal) and you may become pregnant, seek counseling before you get pregnant.  Take 400 to 800 micrograms (mcg) of folic acid every day if you become pregnant.  Ask for birth control (contraception) if you want to prevent pregnancy. Osteoporosis and menopause Osteoporosis is a disease in which the bones lose minerals and strength with aging. This can result in bone fractures. If you are 11 years old or older, or if you are at risk for osteoporosis and fractures, ask your health care provider if you should:  Be screened for bone loss.  Take a calcium or vitamin D supplement to lower your risk of fractures.  Be given hormone replacement therapy (HRT) to treat symptoms of menopause. Follow these instructions at home: Lifestyle  Do not use any products that contain nicotine or tobacco, such as cigarettes, e-cigarettes, and chewing tobacco. If you need help quitting, ask your health care provider.  Do not use street drugs.  Do not share needles.  Ask your health care provider for help if you need support or information about quitting drugs. Alcohol use  Do not drink alcohol if: ? Your health care provider tells you not to drink. ? You are pregnant, may be pregnant, or are planning to become pregnant.  If you drink alcohol: ? Limit how much you use to 0-1 drink a day. ? Limit intake if you are breastfeeding.  Be aware of how much alcohol is in your drink. In the [place] one drink equals one 12 oz bottle of beer (355 mL), one 5 oz glass of wine (148 mL), or one 1 oz glass of hard liquor (44 mL). General instructions  Schedule  regular health, dental, and eye exams.  Stay current with your vaccines.  Tell your health care provider if: ? You often feel depressed. ? You have ever been abused or do not feel safe at home. Summary  Adopting a healthy lifestyle and getting preventive care are important in promoting health and wellness.  Follow your health care provider's instructions about healthy diet, exercising, and getting tested or screened for diseases.  Follow your health care provider's instructions on monitoring your cholesterol and blood pressure. This information is not intended to replace advice given to you by your health care provider. Make sure you discuss any questions you have with your health care provider. Document  Revised: 12/27/2017 Document Reviewed: 12/27/2017 Elsevier Patient Education  2020 [employer]      Agustina Caroli, MD [facility] [employer] [facility]

## 2019-11-28 ENCOUNTER — Institutional Professional Consult (permissible substitution): Payer: No Typology Code available for payment source | Admitting: Neurology

## 2020-01-09 ENCOUNTER — Other Ambulatory Visit: Payer: Self-pay | Admitting: Emergency Medicine

## 2020-01-22 ENCOUNTER — Other Ambulatory Visit: Payer: Self-pay | Admitting: Emergency Medicine

## 2020-01-22 DIAGNOSIS — F4323 Adjustment disorder with mixed anxiety and depressed mood: Secondary | ICD-10-CM

## 2020-02-08 ENCOUNTER — Ambulatory Visit
Admission: RE | Admit: 2020-02-08 | Discharge: 2020-02-08 | Disposition: A | Discharge status: Home / Self Care | Payer: No Typology Code available for payment source | Source: Ambulatory Visit | Attending: Emergency Medicine | Admitting: Emergency Medicine

## 2020-02-08 ENCOUNTER — Other Ambulatory Visit: Payer: Self-pay

## 2020-02-08 VITALS — BP 140/94 | HR 99 | Temp 98.4°F | Resp 18

## 2020-02-08 DIAGNOSIS — J22 Unspecified acute lower respiratory infection: Secondary | ICD-10-CM

## 2020-02-08 MED ORDER — SPACER/AERO-HOLDING CHAMBERS DEVI
1.0000 | Freq: Once | 0 refills | Status: AC
Start: 2020-02-08 — End: 2020-02-08

## 2020-02-08 MED ORDER — DOXYCYCLINE HYCLATE 100 MG PO CAPS: 100.0000 mg | ORAL_CAPSULE | Freq: Two times a day (BID) | ORAL | 0 refills | Status: AC

## 2020-02-08 MED ORDER — ALBUTEROL SULFATE HFA 108 (90 BASE) MCG/ACT IN AERS: 1.0000 | INHALATION_SPRAY | Freq: Four times a day (QID) | RESPIRATORY_TRACT | 0 refills | Status: DC | PRN

## 2020-02-08 MED ORDER — BENZONATATE 200 MG PO CAPS: 200.0000 mg | ORAL_CAPSULE | Freq: Three times a day (TID) | ORAL | 0 refills | Status: AC | PRN

## 2020-02-08 MED ORDER — GUAIFENESIN-CODEINE 100-10 MG/5ML PO SOLN: 5.0000 mL | Freq: Every evening | ORAL | 0 refills | Status: DC | PRN

## 2020-02-08 MED ORDER — PREDNISONE 20 MG PO TABS
40.0000 mg | ORAL_TABLET | Freq: Every day | ORAL | 0 refills | Status: AC
Start: 2020-02-08 — End: 2020-02-13

## 2020-02-08 NOTE — ED Provider Notes (Signed)
[name]    CSN: [id number] Arrival date & time: 02/08/20  0844      History   Chief Complaint Chief Complaint  Patient presents with  . Cough    HPI Tracy [name] is a 45 y.o. female.  history hypertension, presenting today for evaluation of cough and congestion.  Reports cough, congestion and episodes of shortness of breath over the past 1 to 2 weeks.  Has had sinus congestion and pressure along with symptoms in her chest.  Denies any significant wheezing.  Has had prior COVID testing which was negative.  Patient is vaccinated and boosted.  Denies fevers.  Denies history of asthma or tobacco use.  HPI  Past Medical History:  Diagnosis Date  . Hypertension     Patient Active Problem List   Diagnosis Date Noted  . 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 Essential hypertension 05/12/2017    Past Surgical History:  Procedure Laterality Date  . CESAREAN SECTION    . TONSILLECTOMY    . UTERINE FIBROID SURGERY      OB History   No obstetric history on file.      Home Medications    Prior to Admission medications   Medication Sig Start Date End Date Taking? Authorizing Provider  albuterol (VENTOLIN HFA) 108 (90 Base) MCG/ACT inhaler Inhale 1-2 puffs into the lungs every 6 (six) hours as needed for wheezing or shortness of breath. 02/08/20  Yes Kasey Hansell C, PA-C  benzonatate (TESSALON) 200 MG capsule Take 1 capsule (200 mg total) by mouth 3 (three) times daily as needed for up to 7 days for cough (daytime). 02/08/20 02/15/20 Yes Breniya Goertzen C, PA-C  doxycycline (VIBRAMYCIN) 100 MG capsule Take 1 capsule (100 mg total) by mouth 2 (two) times daily for 7 days. 02/08/20 02/15/20 Yes Yarelin Reichardt C, PA-C  guaiFENesin-codeine 100-10 MG/5ML syrup Take 5-10 mLs by mouth at bedtime as needed for cough. 02/08/20  Yes Marcellas Marchant C, PA-C  predniSONE (DELTASONE) 20 MG tablet Take 2 tablets (40 mg total) by mouth daily with breakfast  for 5 days. 02/08/20 02/13/20 Yes Augustina Braddock C, PA-C  Spacer/Aero-Holding Chambers DEVI 1 each by Does not apply route once for 1 dose. 02/08/20 02/08/20 Yes Rashad Auld C, PA-C  amLODipine (NORVASC) 10 MG tablet TAKE 1 TABLET BY MOUTH EVERY DAY 01/09/20   Horald Pollen, MD  butalbital-acetaminophen-caffeine (FIORICET) [phone] MG tablet Take 1-2 tablets by mouth every 6 (six) hours as needed for headache. 10/24/19 10/23/20  Horald Pollen, MD  cetirizine (ZYRTEC ALLERGY) 10 MG tablet Take 1 tablet (10 mg total) by mouth daily. 07/24/19   Hall-Potvin, [place], PA-C  clotrimazole-betamethasone (LOTRISONE) cream Apply to affected area 2 times daily prn 07/04/19   Tasia Catchings, Amy V, PA-C  DULoxetine (CYMBALTA) 30 MG capsule TAKE 1 CAPSULE EVERY DAY 01/22/20   Horald Pollen, MD  fexofenadine (ALLEGRA) 180 MG tablet Take 180 mg by mouth daily. Patient not taking: Reported on 10/24/2019    [provider]  fluticasone (FLONASE) 50 MCG/ACT nasal spray Place 1 spray into both nostrils daily. 07/24/19   Hall-Potvin, [place], PA-C  ibuprofen (ADVIL,MOTRIN) 800 MG tablet Take 1 tablet (800 mg total) by mouth 3 (three) times daily. Patient taking differently: Take 800 mg by mouth 3 (three) times daily as needed for moderate pain.  08/06/15   Delsa Grana, PA-C  lisinopril-hydrochlorothiazide (ZESTORETIC) 20-12.5 MG tablet TAKE 1 TABLET BY MOUTH EVERY DAY 02/10/19   Sagardia,  Ines Bloomer, MD  Multiple Vitamin (MULTIVITAMIN WITH MINERALS) TABS tablet Take 1 tablet by mouth daily.    [provider]    Family History Family History  Problem Relation Age of Onset  . Hypertension Mother   . Diabetes Mother   . Hypertension Father   . Diabetes Father     Social History Social History   Tobacco Use  . Smoking status: Never Smoker  . Smokeless tobacco: Never Used  Substance Use Topics  . Alcohol use: No  . Drug use: No     Allergies   Omnicef [cefdinir], Latex, and  Penicillins   Review of Systems Review of Systems  Constitutional: Negative for activity change, appetite change, chills, fatigue and fever.  HENT: Positive for congestion. Negative for ear pain, rhinorrhea, sinus pressure, sore throat and trouble swallowing.   Eyes: Negative for discharge and redness.  Respiratory: Positive for cough and shortness of breath. Negative for chest tightness.   Cardiovascular: Negative for chest pain.  Gastrointestinal: Negative for abdominal pain, diarrhea, nausea and vomiting.  Musculoskeletal: Negative for myalgias.  Skin: Negative for rash.  Neurological: Negative for dizziness, light-headedness and headaches.     Physical Exam Triage Vital Signs ED Triage Vitals  Enc Vitals Group     BP      Pulse      Resp      Temp      Temp src      SpO2      Weight      Height      Head Circumference      Peak Flow      Pain Score      Pain Loc      Pain Edu?      Excl. in [place]?    No data found.  Updated Vital Signs BP (!) 140/94 (BP Location: Right Arm)   Pulse 99   Temp 98.4 F (36.9 C) (Oral)   Resp 18   SpO2 95%   Visual Acuity Right Eye Distance:   Left Eye Distance:   Bilateral Distance:    Right Eye Near:   Left Eye Near:    Bilateral Near:     Physical Exam Vitals and nursing note reviewed.  Constitutional:      Appearance: She is well-developed and well-nourished.     Comments: No acute distress  HENT:     Head: Normocephalic and atraumatic.     Ears:     Comments: Bilateral ears without tenderness to palpation of external auricle, tragus and mastoid, EAC's without erythema or swelling, bilateral TMs opaque and slightly dull, minimal erythema     Nose: Nose normal.     Mouth/Throat:     Comments: Oral mucosa pink and moist, no tonsillar enlargement or exudate. Posterior pharynx patent and nonerythematous, no uvula deviation or swelling. Normal phonation. Eyes:     Conjunctiva/sclera: Conjunctivae normal.   Cardiovascular:     Rate and Rhythm: Normal rate and regular rhythm.  Pulmonary:     Effort: Pulmonary effort is normal. No respiratory distress.     Comments: Breathing comfortably at rest, CTABL, no wheezing, rales or other adventitious sounds auscultated  Bronchospasm with cough, otherwise clear Abdominal:     General: There is no distension.  Musculoskeletal:        General: Normal range of motion.     Cervical back: Neck supple.  Skin:    General: Skin is warm and dry.  Neurological:  Mental Status: She is alert and oriented to person, place, and time.  Psychiatric:        Mood and Affect: Mood and affect normal.      UC Treatments / Results  Labs (all labs ordered are listed, but only abnormal results are displayed) Labs Reviewed - No data to display  EKG   Radiology No results found.  Procedures Procedures (including critical care time)  Medications Ordered in UC Medications - No data to display  Initial Impression / Assessment and Plan / UC Course  I have reviewed the triage vital signs and the nursing notes.  Pertinent labs & imaging results that were available during my care of the patient were reviewed by me and considered in my medical decision making (see chart for details).     Treating for bronchitis/sinusitis- initiating on doxycycline, prednisone, albuterol, Tessalon for daytime cough and Robitussin with codeine at bedtime.  Rest and fluids.  Monitor for resolution of symptoms.  Discussed strict return precautions. Patient verbalized understanding and is agreeable with plan.  Final Clinical Impressions(s) / UC Diagnoses   Final diagnoses:  Lower respiratory infection (e.g., bronchitis, pneumonia, pneumonitis, pulmonitis)     Discharge Instructions     Begin doxycycline twice daily for 1 week Prednisone 40 mg daily for the next 5 days-take with food Albuterol inhaler to use as needed for shortness of breath wheezing, chest  tightness Tessalon for cough during the day Robitussin with codeine at bedtime Rest and fluids Continue Allegra Follow-up if not improving or worsening    ED Prescriptions    Medication Sig Dispense Auth. Provider   predniSONE (DELTASONE) 20 MG tablet Take 2 tablets (40 mg total) by mouth daily with breakfast for 5 days. 10 tablet Alayzia Pavlock C, PA-C   benzonatate (TESSALON) 200 MG capsule Take 1 capsule (200 mg total) by mouth 3 (three) times daily as needed for up to 7 days for cough (daytime). 28 capsule Marleah Beever C, PA-C   guaiFENesin-codeine 100-10 MG/5ML syrup Take 5-10 mLs by mouth at bedtime as needed for cough. 100 mL Fredrico Beedle C, PA-C   albuterol (VENTOLIN HFA) 108 (90 Base) MCG/ACT inhaler Inhale 1-2 puffs into the lungs every 6 (six) hours as needed for wheezing or shortness of breath. 8 g Jacoya Bauman, [place] C, PA-C   Spacer/Aero-Holding Chambers DEVI 1 each by Does not apply route once for 1 dose. 1 each Maira Christon C, PA-C   doxycycline (VIBRAMYCIN) 100 MG capsule Take 1 capsule (100 mg total) by mouth 2 (two) times daily for 7 days. 14 capsule Traveion Ruddock, [place] C, PA-C     PDMP not reviewed this encounter.   Janith Lima, [place] 02/08/20 [phone]

## 2020-02-08 NOTE — Discharge Instructions (Signed)
Begin doxycycline twice daily for 1 week Prednisone 40 mg daily for the next 5 days-take with food Albuterol inhaler to use as needed for shortness of breath wheezing, chest tightness Tessalon for cough during the day Robitussin with codeine at bedtime Rest and fluids Continue Allegra Follow-up if not improving or worsening

## 2020-02-08 NOTE — ED Triage Notes (Signed)
Pt here for cough and pain with cough x 2 weeks

## 2020-03-23 ENCOUNTER — Ambulatory Visit
Admission: RE | Admit: 2020-03-23 | Discharge: 2020-03-23 | Disposition: A | Discharge status: Home / Self Care | Payer: No Typology Code available for payment source | Source: Ambulatory Visit | Attending: Family Medicine | Admitting: Family Medicine

## 2020-03-23 ENCOUNTER — Other Ambulatory Visit: Payer: Self-pay

## 2020-03-23 VITALS — BP 134/89 | HR 85 | Temp 97.9°F | Resp 20

## 2020-03-23 DIAGNOSIS — H66002 Acute suppurative otitis media without spontaneous rupture of ear drum, left ear: Secondary | ICD-10-CM

## 2020-03-23 DIAGNOSIS — R0981 Nasal congestion: Secondary | ICD-10-CM

## 2020-03-23 DIAGNOSIS — H66004 Acute suppurative otitis media without spontaneous rupture of ear drum, recurrent, right ear: Secondary | ICD-10-CM

## 2020-03-23 DIAGNOSIS — H65191 Other acute nonsuppurative otitis media, right ear: Secondary | ICD-10-CM

## 2020-03-23 MED ORDER — PREDNISONE 20 MG PO TABS: 20.0000 mg | ORAL_TABLET | Freq: Every day | ORAL | 0 refills | Status: AC

## 2020-03-23 MED ORDER — DOXYCYCLINE HYCLATE 100 MG PO CAPS
100.0000 mg | ORAL_CAPSULE | Freq: Two times a day (BID) | ORAL | 0 refills | Status: DC
Start: 2020-03-23 — End: 2020-05-10

## 2020-03-23 NOTE — ED Triage Notes (Signed)
Pt presents with concerns for allergies. Reports she is having congestion, nasal drainage, left ear hearing loss and right ear pain. Denies any other symptoms like fatigue, cough, or fever. Reports pcp will not see her.

## 2020-03-23 NOTE — ED Provider Notes (Signed)
[name]    CSN: [id number] Arrival date & time: 03/23/20  0855      History   Chief Complaint Chief Complaint  Patient presents with  . Otalgia    HPI Tracy [name] is a 45 y.o. female.   HPI  Patient presents today for evaluation of the left and right ear pain and hearing loss involving the left ear. Patient has a history of recurrent seasonal allergies. Symptoms are managed with Nasacort nasal spray and daily Allegra taking year-round.  2 weeks ago patient developed pressure in both ears.  She reports yawning and feeling the sensation of a pop in the left ears with subsequent gradually worsening pressure in the left ear.  She has some mild pressure in the right ear.  Left ear became painful overnight and upon awakening this morning patient noticed that symptoms have worsened.  She presents for evaluation.  No fever, cough, shortness of breath or wheezing. Past Medical History:  Diagnosis Date  . Hypertension     Patient Active Problem List   Diagnosis Date Noted  . 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 Essential hypertension 05/12/2017    Past Surgical History:  Procedure Laterality Date  . CESAREAN SECTION    . TONSILLECTOMY    . UTERINE FIBROID SURGERY      OB History   No obstetric history on file.      Home Medications    Prior to Admission medications   Medication Sig Start Date End Date Taking? Authorizing Provider  albuterol (VENTOLIN HFA) 108 (90 Base) MCG/ACT inhaler Inhale 1-2 puffs into the lungs every 6 (six) hours as needed for wheezing or shortness of breath. 02/08/20   Wieters, Hallie C, PA-C  amLODipine (NORVASC) 10 MG tablet TAKE 1 TABLET BY MOUTH EVERY DAY 01/09/20   Horald Pollen, MD  butalbital-acetaminophen-caffeine (FIORICET) [phone] MG tablet Take 1-2 tablets by mouth every 6 (six) hours as needed for headache. 10/24/19 10/23/20  Horald Pollen, MD  cetirizine (ZYRTEC ALLERGY) 10 MG  tablet Take 1 tablet (10 mg total) by mouth daily. 07/24/19   Hall-Potvin, [place], PA-C  clotrimazole-betamethasone (LOTRISONE) cream Apply to affected area 2 times daily prn 07/04/19   Tasia Catchings, Amy V, PA-C  DULoxetine (CYMBALTA) 30 MG capsule TAKE 1 CAPSULE EVERY DAY 01/22/20   Horald Pollen, MD  fexofenadine (ALLEGRA) 180 MG tablet Take 180 mg by mouth daily. Patient not taking: No sig reported    [provider]  fluticasone (FLONASE) 50 MCG/ACT nasal spray Place 1 spray into both nostrils daily. 07/24/19   Hall-Potvin, [place], PA-C  guaiFENesin-codeine 100-10 MG/5ML syrup Take 5-10 mLs by mouth at bedtime as needed for cough. 02/08/20   Wieters, Hallie C, PA-C  ibuprofen (ADVIL,MOTRIN) 800 MG tablet Take 1 tablet (800 mg total) by mouth 3 (three) times daily. Patient taking differently: Take 800 mg by mouth 3 (three) times daily as needed for moderate pain. 08/06/15   Delsa Grana, PA-C  lisinopril-hydrochlorothiazide (ZESTORETIC) 20-12.5 MG tablet TAKE 1 TABLET BY MOUTH EVERY DAY 02/10/19   Horald Pollen, MD  Multiple Vitamin (MULTIVITAMIN WITH MINERALS) TABS tablet Take 1 tablet by mouth daily.    [provider]    Family History Family History  Problem Relation Age of Onset  . Hypertension Mother   . Diabetes Mother   . Hypertension Father   . Diabetes Father     Social History Social History   Tobacco Use  . Smoking  status: Never Smoker  . Smokeless tobacco: Never Used  Substance Use Topics  . Alcohol use: No  . Drug use: No     Allergies   Omnicef [cefdinir], Latex, and Penicillins   Review of Systems Review of Systems Pertinent negatives listed in HPI   Physical Exam Triage Vital Signs ED Triage Vitals  Enc Vitals Group     BP 03/23/20 0905 134/89     Pulse Rate 03/23/20 0905 85     Resp 03/23/20 0905 20     Temp 03/23/20 0905 97.9 F (36.6 C)     Temp Source 03/23/20 0905 Oral     SpO2 03/23/20 0905 99 %     Weight --       Height --      Head Circumference --      Peak Flow --      Pain Score 03/23/20 0908 3     Pain Loc --      Pain Edu? --      Excl. in [place]? --    No data found.  Updated Vital Signs BP 134/89 (BP Location: Right Arm)   Pulse 85   Temp 97.9 F (36.6 C) (Oral)   Resp 20   SpO2 99%   Visual Acuity Right Eye Distance:   Left Eye Distance:   Bilateral Distance:    Right Eye Near:   Left Eye Near:    Bilateral Near:     Physical Exam General Appearance:    Alert, cooperative, no distress  HENT:  Normocephalic, right ear effusion present, left ear TM bulging erythematous-tenderness- effusion, nasal congestion with mucosal edema present,oropharynx normal, negative cervical adenopathy,   Eyes:    PERRL, conjunctiva/corneas clear, EOM's intact       Lungs:     Clear to auscultation bilaterally, respirations unlabored  Heart:    Regular rate and rhythm  Neurologic:   Awake, alert, oriented x 3. No apparent focal neurological           defect.        UC Treatments / Results  Labs (all labs ordered are listed, but only abnormal results are displayed) Labs Reviewed - No data to display  EKG   Radiology No results found.  Procedures Procedures (including critical care time)  Medications Ordered in UC Medications - No data to display  Initial Impression / Assessment and Plan / UC Course  I have reviewed the triage vital signs and the nursing notes.  Pertinent labs & imaging results that were available during my care of the patient were reviewed by me and considered in my medical decision making (see chart for details).    Acute otitis media involving the left ear, start doxycycline 100 mg twice daily for 10 days.  Continue antihistamine therapy and nasal spray for management of nasal symptoms related to allergies.  Prednisone 20 mg once daily for 5 days for inner ear pressure and nasal congestion.  If symptoms worsen or do not improve return for evaluation or follow-up with  primary care. Final Clinical Impressions(s) / UC Diagnoses   Final diagnoses:  Recurrent acute suppurative otitis media of right ear without spontaneous rupture of tympanic membrane  Nasal congestion   Discharge Instructions   None    ED Prescriptions    Medication Sig Dispense Auth. Provider   doxycycline (VIBRAMYCIN) 100 MG capsule Take 1 capsule (100 mg total) by mouth 2 (two) times daily. 20 capsule Scot Jun, FNP   predniSONE (  DELTASONE) 20 MG tablet Take 1 tablet (20 mg total) by mouth daily with breakfast for 5 days. 5 tablet Scot Jun, FNP     PDMP not reviewed this encounter.   Scot Jun, FNP 03/23/20 1036

## 2020-04-06 ENCOUNTER — Other Ambulatory Visit: Payer: Self-pay | Admitting: Emergency Medicine

## 2020-05-10 ENCOUNTER — Other Ambulatory Visit: Payer: Self-pay

## 2020-05-10 ENCOUNTER — Ambulatory Visit
Admission: RE | Admit: 2020-05-10 | Discharge: 2020-05-10 | Disposition: A | Discharge status: Home / Self Care | Payer: No Typology Code available for payment source | Source: Ambulatory Visit

## 2020-05-10 VITALS — BP 123/84 | HR 78 | Temp 98.2°F | Resp 18

## 2020-05-10 DIAGNOSIS — H66005 Acute suppurative otitis media without spontaneous rupture of ear drum, recurrent, left ear: Secondary | ICD-10-CM | POA: Diagnosis not present

## 2020-05-10 MED ORDER — DOXYCYCLINE HYCLATE 100 MG PO CAPS
100.0000 mg | ORAL_CAPSULE | Freq: Two times a day (BID) | ORAL | 0 refills | Status: DC
Start: 2020-05-10 — End: 2020-07-23

## 2020-05-10 MED ORDER — PREDNISONE 10 MG (21) PO TBPK
ORAL_TABLET | Freq: Every day | ORAL | 0 refills | Status: DC
Start: 2020-05-10 — End: 2020-07-23

## 2020-05-10 NOTE — ED Triage Notes (Signed)
Left ear pain since previous visit.  Hearing is decreased  Both ears hurt, but left is worse than right.  Pain is worse at night.   Patient reports a cough, post nasal drip, sneezing and slight runny nose intermittently

## 2020-05-10 NOTE — ED Provider Notes (Signed)
[name]    CSN: [id number] Arrival date & time: 05/10/20  1252      History   Chief Complaint Chief Complaint  Patient presents with  . Appointment    1:00 Pm  . Otalgia    HPI Tracy [name] is a 45 y.o. female.   Tracy [name] presents with complaints of left ear pain, some discomfort to right ear, as well as decreased hearing. She has had allergic symptoms with rhinitis and congestion which has been ongoing now for weeks. She was treated for ear infection over a month ago and ear symptoms improved. She states she was as consistent with her allergy medications and ear symptoms returned. Now with ear pain which is worse at night. L>R. No known fevers. No ear drainage. Has never had to follow with ENT.     ROS per HPI, negative if not otherwise mentioned.      Past Medical History:  Diagnosis Date  . Hypertension     Patient Active Problem List   Diagnosis Date Noted  . 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 Essential hypertension 05/12/2017    Past Surgical History:  Procedure Laterality Date  . CESAREAN SECTION    . TONSILLECTOMY    . UTERINE FIBROID SURGERY      OB History   No obstetric history on file.      Home Medications    Prior to Admission medications   Medication Sig Start Date End Date Taking? Authorizing Provider  albuterol (VENTOLIN HFA) 108 (90 Base) MCG/ACT inhaler Inhale 1-2 puffs into the lungs every 6 (six) hours as needed for wheezing or shortness of breath. 02/08/20  Yes Wieters, Hallie C, PA-C  amLODipine (NORVASC) 10 MG tablet TAKE 1 TABLET BY MOUTH EVERY DAY 04/06/20  Yes Sagardia, Ines Bloomer, MD  diphenhydrAMINE (BENADRYL) 25 MG tablet Take 25 mg by mouth every 6 (six) hours as needed.   Yes [provider]  fexofenadine (ALLEGRA) 180 MG tablet Take 180 mg by mouth daily.   Yes [provider]  fluticasone (FLONASE) 50 MCG/ACT nasal spray Place 1 spray into both nostrils  daily. 07/24/19  Yes Hall-Potvin, [place], PA-C  lisinopril-hydrochlorothiazide (ZESTORETIC) 20-12.5 MG tablet TAKE 1 TABLET BY MOUTH EVERY DAY 02/10/19  Yes Sagardia, Ines Bloomer, MD  Multiple Vitamin (MULTIVITAMIN WITH MINERALS) TABS tablet Take 1 tablet by mouth daily.   Yes [provider]  predniSONE (STERAPRED UNI-PAK 21 TAB) 10 MG (21) TBPK tablet Take by mouth daily. Per box instruction 05/10/20  Yes Keenon Leitzel, Malachy Moan, NP  butalbital-acetaminophen-caffeine (FIORICET) 50-325-40 MG tablet Take 1-2 tablets by mouth every 6 (six) hours as needed for headache. 10/24/19 10/23/20  Horald Pollen, MD  cetirizine (ZYRTEC ALLERGY) 10 MG tablet Take 1 tablet (10 mg total) by mouth daily. Patient not taking: Reported on 05/10/2020 07/24/19   Hall-Potvin, [place], PA-C  clotrimazole-betamethasone (LOTRISONE) cream Apply to affected area 2 times daily prn 07/04/19   Tasia Catchings, Amy V, PA-C  doxycycline (VIBRAMYCIN) 100 MG capsule Take 1 capsule (100 mg total) by mouth 2 (two) times daily. 05/10/20   Zigmund Gottron, NP  DULoxetine (CYMBALTA) 30 MG capsule TAKE 1 CAPSULE EVERY DAY 01/22/20   Horald Pollen, MD  guaiFENesin-codeine 100-10 MG/5ML syrup Take 5-10 mLs by mouth at bedtime as needed for cough. 02/08/20   Wieters, Hallie C, PA-C  ibuprofen (ADVIL,MOTRIN) 800 MG tablet Take 1 tablet (800 mg total) by mouth 3 (three) times daily. Patient  taking differently: Take 800 mg by mouth 3 (three) times daily as needed for moderate pain. 08/06/15   Delsa Grana, PA-C    Family History Family History  Problem Relation Age of Onset  . Hypertension Mother   . Diabetes Mother   . Hypertension Father   . Diabetes Father     Social History Social History   Tobacco Use  . Smoking status: Never Smoker  . Smokeless tobacco: Never Used  Vaping Use  . Vaping Use: Never used  Substance Use Topics  . Alcohol use: No  . Drug use: No     Allergies   Omnicef [cefdinir], Latex, and  Penicillins   Review of Systems Review of Systems   Physical Exam Triage Vital Signs ED Triage Vitals  Enc Vitals Group     BP 05/10/20 1339 123/84     Pulse Rate 05/10/20 1339 78     Resp 05/10/20 1339 18     Temp 05/10/20 1339 98.2 F (36.8 C)     Temp Source 05/10/20 1339 Oral     SpO2 05/10/20 1339 95 %     Weight --      Height --      Head Circumference --      Peak Flow --      Pain Score 05/10/20 1334 3     Pain Loc --      Pain Edu? --      Excl. in [place]? --    No data found.  Updated Vital Signs BP 123/84 (BP Location: Left Arm)   Pulse 78   Temp 98.2 F (36.8 C) (Oral)   Resp 18   SpO2 95%   Visual Acuity Right Eye Distance:   Left Eye Distance:   Bilateral Distance:    Right Eye Near:   Left Eye Near:    Bilateral Near:     Physical Exam Constitutional:      General: She is not in acute distress.    Appearance: She is well-developed.  HENT:     Right Ear: A middle ear effusion is present.     Left Ear: A middle ear effusion is present. Tympanic membrane is injected.  Cardiovascular:     Rate and Rhythm: Normal rate.  Pulmonary:     Effort: Pulmonary effort is normal.  Skin:    General: Skin is warm and dry.  Neurological:     Mental Status: She is alert and oriented to person, place, and time.      UC Treatments / Results  Labs (all labs ordered are listed, but only abnormal results are displayed) Labs Reviewed - No data to display  EKG   Radiology No results found.  Procedures Procedures (including critical care time)  Medications Ordered in UC Medications - No data to display  Initial Impression / Assessment and Plan / UC Course  I have reviewed the triage vital signs and the nursing notes.  Pertinent labs & imaging results that were available during my care of the patient were reviewed by me and considered in my medical decision making (see chart for details).     Again with apparent OM to left ear and effusion to  right ear. Prednisone pack as well as course of doxycycline provided. Encouraged to continue with allergy regimen, follow up with ENT as this has been recurrent issue for patient. Patient verbalized understanding and agreeable to plan.   Final Clinical Impressions(s) / UC Diagnoses   Final diagnoses:  Recurrent  acute suppurative otitis media without spontaneous rupture of left tympanic membrane     Discharge Instructions     I do recommend following up with ENT as this seems to be recurrent for you.  Stay well hydrated to try to keep secretions loose.  Prednisone pack.  Course of antibiotics.  Continue with daily allergy medication as well as daily nasal spray.    ED Prescriptions    Medication Sig Dispense Auth. Provider   doxycycline (VIBRAMYCIN) 100 MG capsule Take 1 capsule (100 mg total) by mouth 2 (two) times daily. 20 capsule Augusto Gamble B, NP   predniSONE (STERAPRED UNI-PAK 21 TAB) 10 MG (21) TBPK tablet Take by mouth daily. Per box instruction 21 tablet Zigmund Gottron, NP     PDMP not reviewed this encounter.   Zigmund Gottron, NP 05/10/20 1404

## 2020-05-10 NOTE — Discharge Instructions (Signed)
I do recommend following up with ENT as this seems to be recurrent for you.  Stay well hydrated to try to keep secretions loose.  Prednisone pack.  Course of antibiotics.  Continue with daily allergy medication as well as daily nasal spray.

## 2020-05-26 ENCOUNTER — Other Ambulatory Visit: Payer: Self-pay

## 2020-05-26 ENCOUNTER — Ambulatory Visit
Admission: RE | Admit: 2020-05-26 | Discharge: 2020-05-26 | Disposition: A | Discharge status: Home / Self Care | Payer: No Typology Code available for payment source | Source: Ambulatory Visit | Attending: Emergency Medicine | Admitting: Emergency Medicine

## 2020-05-26 DIAGNOSIS — Z1231 Encounter for screening mammogram for malignant neoplasm of breast: Secondary | ICD-10-CM

## 2020-05-27 ENCOUNTER — Other Ambulatory Visit: Payer: Self-pay | Admitting: Emergency Medicine

## 2020-05-27 DIAGNOSIS — R928 Other abnormal and inconclusive findings on diagnostic imaging of breast: Secondary | ICD-10-CM

## 2020-05-29 ENCOUNTER — Ambulatory Visit
Admission: RE | Admit: 2020-05-29 | Discharge: 2020-05-29 | Disposition: A | Discharge status: Home / Self Care | Payer: No Typology Code available for payment source | Source: Ambulatory Visit | Attending: Emergency Medicine | Admitting: Emergency Medicine

## 2020-05-29 ENCOUNTER — Other Ambulatory Visit: Payer: Self-pay | Admitting: Emergency Medicine

## 2020-05-29 ENCOUNTER — Other Ambulatory Visit: Payer: Self-pay

## 2020-05-29 DIAGNOSIS — R928 Other abnormal and inconclusive findings on diagnostic imaging of breast: Secondary | ICD-10-CM

## 2020-06-01 ENCOUNTER — Ambulatory Visit
Admission: RE | Admit: 2020-06-01 | Discharge: 2020-06-01 | Disposition: A | Discharge status: Home / Self Care | Payer: No Typology Code available for payment source | Source: Ambulatory Visit | Attending: Emergency Medicine | Admitting: Emergency Medicine

## 2020-06-01 ENCOUNTER — Other Ambulatory Visit: Payer: Self-pay

## 2020-06-01 DIAGNOSIS — R928 Other abnormal and inconclusive findings on diagnostic imaging of breast: Secondary | ICD-10-CM

## 2020-06-18 ENCOUNTER — Other Ambulatory Visit: Payer: Self-pay | Admitting: Emergency Medicine

## 2020-06-18 DIAGNOSIS — I10 Essential (primary) hypertension: Secondary | ICD-10-CM

## 2020-06-20 MED ORDER — LISINOPRIL-HYDROCHLOROTHIAZIDE 20-12.5 MG PO TABS: 1.0000 | ORAL_TABLET | Freq: Every day | ORAL | 0 refills | Status: DC

## 2020-06-21 ENCOUNTER — Ambulatory Visit: Payer: Self-pay

## 2020-07-03 ENCOUNTER — Ambulatory Visit: Payer: Self-pay | Admitting: Surgery

## 2020-07-03 DIAGNOSIS — N6489 Other specified disorders of breast: Secondary | ICD-10-CM

## 2020-07-03 NOTE — H&P (View-Only) (Signed)
[name] Appointment: 07/03/2020 11:00 AM Location: [facility] Patient #: [id number] DOB: Aug 23, 1975 Divorced / Language: Cleophus Molt / Race: Black or African American Female  History of Present Illness Marcello Moores A. Shaquera Ansley MD; 07/03/2020 12:32 PM) Patient words: Patient presents for evaluation of abnormal left breast screening mammogram.  She has small density in the left breast upper quadrant core biopsy proven to be a radial scar.  She presents today for discussion of options of management.  She has no complaints of breast pain, nipple discharge or breast mass.  No family history of breast cancer noted.            45 year old female for further evaluation of LEFT breast asymmetry on baseline screening mammogram.  EXAM: DIGITAL DIAGNOSTIC UNILATERAL LEFT MAMMOGRAM WITH TOMOSYNTHESIS AND CAD; ULTRASOUND LEFT BREAST LIMITED  TECHNIQUE: Left digital diagnostic mammography and breast tomosynthesis was performed. The images were evaluated with computer-aided detection.; Targeted ultrasound examination of the left breast was performed  COMPARISON:  05/26/2020 baseline screening mammogram  ACR Breast Density Category b: There are scattered areas of fibroglandular density.  FINDINGS: 2D/3D full field and spot compression views of the LEFT breast demonstrate persistent distortion within the UPPER INNER LEFT breast, middle depth, better visualized on the CC views.  Targeted ultrasound is performed, showing no sonographic correlate to the LEFT breast mammographic distortion.  No abnormal LEFT axillary lymph nodes are noted.  IMPRESSION: 1. UPPER INNER LEFT breast distortion without sonographic correlate. Tissue sampling is recommended. 2. No abnormal appearing LEFT axillary lymph nodes.  RECOMMENDATION: 3D/stereotactic guided LEFT breast biopsy, which will be scheduled.  I have discussed the findings and recommendations with the patient. If applicable, a  reminder letter will be sent to the patient regarding the next appointment.  BI-RADS CATEGORY  4: Suspicious.   Electronically Signed  By: Margarette Canada M.D.  On: 05/29/2020 09:44                 Diagnosis Breast, left, needle core biopsy, upper inner - COMPLEX SCLEROSING LESION WITH FLORID USUAL DUCTAL HYPERPLASIA. SEE NOTE - NEGATIVE FOR CARCINOMA.  The patient is a 45 year old female.   Past Surgical History Janeann Forehand, CNA; 07/03/2020 11:02 AM) Breast Biopsy  Left. Cesarean Section - Multiple  Oral Surgery  Tonsillectomy   Diagnostic Studies History Janeann Forehand, CNA; 07/03/2020 11:02 AM) Colonoscopy  never Mammogram  within last year Pap Smear  1-5 years ago  Allergies Janeann Forehand, CNA; 07/03/2020 11:03 AM) Penicillins  Allergies Reconciled   Medication History Janeann Forehand, CNA; 07/03/2020 11:03 AM) guaiFENesin-Codeine  (100-10MG /5ML Solution, Oral) Active. Albuterol Sulfate HFA  (108 (90 Base)MCG/ACT Aerosol Soln, Inhalation) Active. amLODIPine Besylate  (10MG  Tablet, Oral) Active. Benzonatate  (100MG  Capsule, Oral) Active. Benzonatate  (200MG  Capsule, Oral) Active. Butalbital-APAP-Caffeine  (50-325-40MG  Tablet, Oral) Active. Cetirizine HCl  (10MG  Tablet, Oral) Active. Clotrimazole-Betamethasone  (1-0.05% Cream, External) Active. Doxycycline Hyclate  (100MG  Capsule, Oral) Active. DULoxetine HCl  (30MG  Capsule DR Part, Oral) Active. Fluticasone Propionate  (50MCG/ACT Suspension, Nasal) Active. Lisinopril-hydroCHLOROthiazide  (20-12.5MG  Tablet, Oral) Active. predniSONE  (10MG  (21) Tab Ther Pack, Oral) Active. predniSONE  (20MG  Tablet, Oral) Active. Medications Reconciled   Social History Janeann Forehand, CNA; 07/03/2020 11:02 AM) Alcohol use  Occasional alcohol use. Caffeine use  Carbonated beverages, Tea. No drug use  Tobacco use  Never smoker.  Family History Janeann Forehand, CNA; 07/03/2020 11:02 AM) Arthritis  Father,  Mother. Depression  Daughter, Father. Diabetes Mellitus  Father, Mother. Hypertension  Brother, Father,  Mother. Kidney Disease  Father. Ovarian Cancer  Mother. Thyroid problems  Mother.  Pregnancy / Birth History Janeann Forehand, CNA; 07/03/2020 11:02 AM) Age at menarche  41 years. Contraceptive History  Intrauterine device, Oral contraceptives. Gravida  3 Irregular periods  Length (months) of breastfeeding  3-6 Maternal age  48-20  Other Problems Janeann Forehand, CNA; 07/03/2020 11:02 AM) Back Pain  High blood pressure  Migraine Headache      Review of Systems Adriana Reams Alston CNA; 07/03/2020 11:02 AM) General Present- Night Sweats. Not Present- Appetite Loss, Chills, Fatigue, Fever, Weight Gain and Weight Loss. HEENT Present- Seasonal Allergies. Not Present- Earache, Hearing Loss, Hoarseness, Nose Bleed, Oral Ulcers, Ringing in the Ears, Sinus Pain, Sore Throat, Visual Disturbances, Wears glasses/contact lenses and Yellow Eyes. Breast Present- Breast Mass and Breast Pain. Not Present- Nipple Discharge and Skin Changes. Cardiovascular Not Present- Chest Pain, Difficulty Breathing Lying Down, Leg Cramps, Palpitations, Rapid Heart Rate, Shortness of Breath and Swelling of Extremities. Musculoskeletal Present- Back Pain. Not Present- Joint Pain, Joint Stiffness, Muscle Pain, Muscle Weakness and Swelling of Extremities.  Vitals (Donyelle Alston CNA; 07/03/2020 11:04 AM) 07/03/2020 11:03 AM Weight: 173.38 lb   Height: 63 in  Body Surface Area: 1.82 m   Body Mass Index: 30.71 kg/m   Temp.: 98.2 F    BP: 136/80(Sitting, Left Arm, Standard) nails were too long, could not get a reading on her heart rate or o2       Physical Exam (Alliene Klugh A. Lorcan Shelp MD; 07/03/2020 12:33 PM)  General Mental Status - Alert. General Appearance - Consistent with stated age. Hydration - Well hydrated. Voice - Normal.  Chest and Lung Exam Note:  Workup breathing normal, no  wheezing  Breast Note:  Mild bruising left breast. No masses noted bilaterally otherwise no evidence of nipple discharge bilaterally  Neurologic Neurologic evaluation reveals  - alert and oriented x 3 with no impairment of recent or remote memory. Mental Status - Normal.  Musculoskeletal Normal Exam - Left - Upper Extremity Strength Normal and Lower Extremity Strength Normal. Normal Exam - Right - Upper Extremity Strength Normal and Lower Extremity Strength Normal.  Lymphatic Axillary  General Axillary Region: Bilateral - Description - Normal. Tenderness - Non Tender.    Assessment & Plan (Dalaysia Harms A. 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 Discussed potential C rates of 2-1/2% up to potentially 8-9%. Discussed observation. Discussed lumpectomy and postoperative issues as well as complications the procedure and long-term expectations. Discussed potential cosmetic issues. She has opted to undergo left breast seed localized lumpectomy.  Current Plans Pt Education - [facility] Free Text Education/Instructions: discussed with patient and provided information. Pt Education - [facility] Breast Biopsy HCI: discussed with patient and provided information.

## 2020-07-03 NOTE — H&P (Signed)
[name] Appointment: 07/03/2020 11:00 AM Location: [facility] Patient #: [id number] DOB: May 21, 1975 Divorced / Language: Cleophus Molt / Race: Black or African American Female  History of Present Illness Marcello Moores A. Lilienne Weins MD; 07/03/2020 12:32 PM) Patient words: Patient presents for evaluation of abnormal left breast screening mammogram.  She has small density in the left breast upper quadrant core biopsy proven to be a radial scar.  She presents today for discussion of options of management.  She has no complaints of breast pain, nipple discharge or breast mass.  No family history of breast cancer noted.            45 year old female for further evaluation of LEFT breast asymmetry on baseline screening mammogram.  EXAM: DIGITAL DIAGNOSTIC UNILATERAL LEFT MAMMOGRAM WITH TOMOSYNTHESIS AND CAD; ULTRASOUND LEFT BREAST LIMITED  TECHNIQUE: Left digital diagnostic mammography and breast tomosynthesis was performed. The images were evaluated with computer-aided detection.; Targeted ultrasound examination of the left breast was performed  COMPARISON:  05/26/2020 baseline screening mammogram  ACR Breast Density Category b: There are scattered areas of fibroglandular density.  FINDINGS: 2D/3D full field and spot compression views of the LEFT breast demonstrate persistent distortion within the UPPER INNER LEFT breast, middle depth, better visualized on the CC views.  Targeted ultrasound is performed, showing no sonographic correlate to the LEFT breast mammographic distortion.  No abnormal LEFT axillary lymph nodes are noted.  IMPRESSION: 1. UPPER INNER LEFT breast distortion without sonographic correlate. Tissue sampling is recommended. 2. No abnormal appearing LEFT axillary lymph nodes.  RECOMMENDATION: 3D/stereotactic guided LEFT breast biopsy, which will be scheduled.  I have discussed the findings and recommendations with the patient. If applicable, a  reminder letter will be sent to the patient regarding the next appointment.  BI-RADS CATEGORY  4: Suspicious.   Electronically Signed  By: Margarette Canada M.D.  On: 05/29/2020 09:44                 Diagnosis Breast, left, needle core biopsy, upper inner - COMPLEX SCLEROSING LESION WITH FLORID USUAL DUCTAL HYPERPLASIA. SEE NOTE - NEGATIVE FOR CARCINOMA.  The patient is a 45 year old female.   Past Surgical History Janeann Forehand, CNA; 07/03/2020 11:02 AM) Breast Biopsy  Left. Cesarean Section - Multiple  Oral Surgery  Tonsillectomy   Diagnostic Studies History Janeann Forehand, CNA; 07/03/2020 11:02 AM) Colonoscopy  never Mammogram  within last year Pap Smear  1-5 years ago  Allergies Janeann Forehand, CNA; 07/03/2020 11:03 AM) Penicillins  Allergies Reconciled   Medication History Janeann Forehand, CNA; 07/03/2020 11:03 AM) guaiFENesin-Codeine  (100-10MG /5ML Solution, Oral) Active. Albuterol Sulfate HFA  (108 (90 Base)MCG/ACT Aerosol Soln, Inhalation) Active. amLODIPine Besylate  (10MG  Tablet, Oral) Active. Benzonatate  (100MG  Capsule, Oral) Active. Benzonatate  (200MG  Capsule, Oral) Active. Butalbital-APAP-Caffeine  (50-325-40MG  Tablet, Oral) Active. Cetirizine HCl  (10MG  Tablet, Oral) Active. Clotrimazole-Betamethasone  (1-0.05% Cream, External) Active. Doxycycline Hyclate  (100MG  Capsule, Oral) Active. DULoxetine HCl  (30MG  Capsule DR Part, Oral) Active. Fluticasone Propionate  (50MCG/ACT Suspension, Nasal) Active. Lisinopril-hydroCHLOROthiazide  (20-12.5MG  Tablet, Oral) Active. predniSONE  (10MG  (21) Tab Ther Pack, Oral) Active. predniSONE  (20MG  Tablet, Oral) Active. Medications Reconciled   Social History Janeann Forehand, CNA; 07/03/2020 11:02 AM) Alcohol use  Occasional alcohol use. Caffeine use  Carbonated beverages, Tea. No drug use  Tobacco use  Never smoker.  Family History Janeann Forehand, CNA; 07/03/2020 11:02 AM) Arthritis  Father,  Mother. Depression  Daughter, Father. Diabetes Mellitus  Father, Mother. Hypertension  Brother, Father,  Mother. Kidney Disease  Father. Ovarian Cancer  Mother. Thyroid problems  Mother.  Pregnancy / Birth History Janeann Forehand, CNA; 07/03/2020 11:02 AM) Age at menarche  73 years. Contraceptive History  Intrauterine device, Oral contraceptives. Gravida  3 Irregular periods  Length (months) of breastfeeding  3-6 Maternal age  80-20  Other Problems Janeann Forehand, CNA; 07/03/2020 11:02 AM) Back Pain  High blood pressure  Migraine Headache      Review of Systems Adriana Reams Alston CNA; 07/03/2020 11:02 AM) General Present- Night Sweats. Not Present- Appetite Loss, Chills, Fatigue, Fever, Weight Gain and Weight Loss. HEENT Present- Seasonal Allergies. Not Present- Earache, Hearing Loss, Hoarseness, Nose Bleed, Oral Ulcers, Ringing in the Ears, Sinus Pain, Sore Throat, Visual Disturbances, Wears glasses/contact lenses and Yellow Eyes. Breast Present- Breast Mass and Breast Pain. Not Present- Nipple Discharge and Skin Changes. Cardiovascular Not Present- Chest Pain, Difficulty Breathing Lying Down, Leg Cramps, Palpitations, Rapid Heart Rate, Shortness of Breath and Swelling of Extremities. Musculoskeletal Present- Back Pain. Not Present- Joint Pain, Joint Stiffness, Muscle Pain, Muscle Weakness and Swelling of Extremities.  Vitals (Donyelle Alston CNA; 07/03/2020 11:04 AM) 07/03/2020 11:03 AM Weight: 173.38 lb   Height: 63 in  Body Surface Area: 1.82 m   Body Mass Index: 30.71 kg/m   Temp.: 98.2 F    BP: 136/80(Sitting, Left Arm, Standard) nails were too long, could not get a reading on her heart rate or o2       Physical Exam (Seidy Labreck A. Shenika Quint MD; 07/03/2020 12:33 PM)  General Mental Status - Alert. General Appearance - Consistent with stated age. Hydration - Well hydrated. Voice - Normal.  Chest and Lung Exam Note:  Workup breathing normal, no  wheezing  Breast Note:  Mild bruising left breast. No masses noted bilaterally otherwise no evidence of nipple discharge bilaterally  Neurologic Neurologic evaluation reveals  - alert and oriented x 3 with no impairment of recent or remote memory. Mental Status - Normal.  Musculoskeletal Normal Exam - Left - Upper Extremity Strength Normal and Lower Extremity Strength Normal. Normal Exam - Right - Upper Extremity Strength Normal and Lower Extremity Strength Normal.  Lymphatic Axillary  General Axillary Region: Bilateral - Description - Normal. Tenderness - Non Tender.    Assessment & Plan (Dennisha Mouser A. 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 Discussed potential C rates of 2-1/2% up to potentially 8-9%. Discussed observation. Discussed lumpectomy and postoperative issues as well as complications the procedure and long-term expectations. Discussed potential cosmetic issues. She has opted to undergo left breast seed localized lumpectomy.  Current Plans Pt Education - [facility] Free Text Education/Instructions: discussed with patient and provided information. Pt Education - [facility] Breast Biopsy HCI: discussed with patient and provided information.

## 2020-07-07 ENCOUNTER — Other Ambulatory Visit: Payer: Self-pay | Admitting: Surgery

## 2020-07-07 DIAGNOSIS — N6489 Other specified disorders of breast: Secondary | ICD-10-CM

## 2020-07-23 ENCOUNTER — Other Ambulatory Visit: Payer: Self-pay

## 2020-07-23 ENCOUNTER — Encounter (HOSPITAL_BASED_OUTPATIENT_CLINIC_OR_DEPARTMENT_OTHER): Payer: Self-pay | Admitting: Surgery

## 2020-07-28 ENCOUNTER — Other Ambulatory Visit: Payer: Self-pay

## 2020-07-28 ENCOUNTER — Encounter (HOSPITAL_BASED_OUTPATIENT_CLINIC_OR_DEPARTMENT_OTHER)
Admission: RE | Admit: 2020-07-28 | Discharge: 2020-07-28 | Disposition: A | Discharge status: Home / Self Care | Payer: No Typology Code available for payment source | Source: Ambulatory Visit | Attending: Surgery | Admitting: Surgery

## 2020-07-28 DIAGNOSIS — D242 Benign neoplasm of left breast: Secondary | ICD-10-CM | POA: Diagnosis not present

## 2020-07-28 DIAGNOSIS — N6489 Other specified disorders of breast: Secondary | ICD-10-CM | POA: Diagnosis not present

## 2020-07-28 DIAGNOSIS — Z79899 Other long term (current) drug therapy: Secondary | ICD-10-CM | POA: Diagnosis not present

## 2020-07-28 DIAGNOSIS — Z7952 Long term (current) use of systemic steroids: Secondary | ICD-10-CM | POA: Diagnosis not present

## 2020-07-28 LAB — CBC WITH DIFFERENTIAL/PLATELET
Abs Immature Granulocytes: 0.03 10*3/uL (ref 0.00–0.07)
Basophils Absolute: 0.1 10*3/uL (ref 0.0–0.1)
Basophils Relative: 1 %
Eosinophils Absolute: 0.1 10*3/uL (ref 0.0–0.5)
Eosinophils Relative: 1 %
HCT: 41.7 % (ref 36.0–46.0)
Hemoglobin: 13.9 g/dL (ref 12.0–15.0)
Immature Granulocytes: 0 %
Lymphocytes Relative: 27 %
Lymphs Abs: 2.3 10*3/uL (ref 0.7–4.0)
MCH: 28.9 pg (ref 26.0–34.0)
MCHC: 33.3 g/dL (ref 30.0–36.0)
MCV: 86.7 fL (ref 80.0–100.0)
Monocytes Absolute: 0.6 10*3/uL (ref 0.1–1.0)
Monocytes Relative: 7 %
Neutro Abs: 5.5 10*3/uL (ref 1.7–7.7)
Neutrophils Relative %: 64 %
Platelets: 311 10*3/uL (ref 150–400)
RBC: 4.81 MIL/uL (ref 3.87–5.11)
RDW: 13 % (ref 11.5–15.5)
WBC: 8.4 10*3/uL (ref 4.0–10.5)
nRBC: 0 % (ref 0.0–0.2)

## 2020-07-28 LAB — COMPREHENSIVE METABOLIC PANEL
ALT: 27 U/L (ref 0–44)
AST: 21 U/L (ref 15–41)
Albumin: 3.7 g/dL (ref 3.5–5.0)
Alkaline Phosphatase: 55 U/L (ref 38–126)
Anion gap: 5 (ref 5–15)
BUN: 7 mg/dL (ref 6–20)
CO2: 28 mmol/L (ref 22–32)
Calcium: 9.7 mg/dL (ref 8.9–10.3)
Chloride: 105 mmol/L (ref 98–111)
Creatinine, Ser: 0.95 mg/dL (ref 0.44–1.00)
GFR, Estimated: >60 mL/min (ref >60)
Glucose, Bld: 89 mg/dL (ref 70–99)
Potassium: 4.1 mmol/L (ref 3.5–5.1)
Sodium: 138 mmol/L (ref 135–145)
Total Bilirubin: 0.4 mg/dL (ref 0.3–1.2)
Total Protein: 7 g/dL (ref 6.5–8.1)

## 2020-07-28 MED ORDER — CHLORHEXIDINE GLUCONATE CLOTH 2 % EX PADS: 6.0000 | MEDICATED_PAD | Freq: Once | CUTANEOUS | Status: DC

## 2020-07-28 NOTE — Progress Notes (Signed)

## 2020-07-29 ENCOUNTER — Ambulatory Visit
Admission: RE | Admit: 2020-07-29 | Discharge: 2020-07-29 | Disposition: A | Discharge status: Home / Self Care | Payer: No Typology Code available for payment source | Source: Ambulatory Visit | Attending: Surgery | Admitting: Surgery

## 2020-07-29 ENCOUNTER — Other Ambulatory Visit: Payer: Self-pay

## 2020-07-29 DIAGNOSIS — N6489 Other specified disorders of breast: Secondary | ICD-10-CM

## 2020-07-30 ENCOUNTER — Ambulatory Visit (HOSPITAL_BASED_OUTPATIENT_CLINIC_OR_DEPARTMENT_OTHER): Payer: No Typology Code available for payment source | Admitting: Anesthesiology

## 2020-07-30 ENCOUNTER — Encounter (HOSPITAL_BASED_OUTPATIENT_CLINIC_OR_DEPARTMENT_OTHER)
Admission: RE | Disposition: A | Discharge status: Home / Self Care | Payer: Self-pay | Source: Home / Self Care | Attending: Surgery

## 2020-07-30 ENCOUNTER — Other Ambulatory Visit: Payer: Self-pay

## 2020-07-30 ENCOUNTER — Ambulatory Visit (HOSPITAL_BASED_OUTPATIENT_CLINIC_OR_DEPARTMENT_OTHER)
Admission: RE | Admit: 2020-07-30 | Discharge: 2020-07-30 | Disposition: A | Discharge status: Home / Self Care | Payer: No Typology Code available for payment source | Attending: Surgery | Admitting: Surgery

## 2020-07-30 ENCOUNTER — Encounter (HOSPITAL_BASED_OUTPATIENT_CLINIC_OR_DEPARTMENT_OTHER): Payer: Self-pay | Admitting: Surgery

## 2020-07-30 ENCOUNTER — Ambulatory Visit
Admission: RE | Admit: 2020-07-30 | Discharge: 2020-07-30 | Disposition: A | Discharge status: Home / Self Care | Payer: No Typology Code available for payment source | Source: Ambulatory Visit | Attending: Surgery | Admitting: Surgery

## 2020-07-30 DIAGNOSIS — N6489 Other specified disorders of breast: Secondary | ICD-10-CM | POA: Insufficient documentation

## 2020-07-30 DIAGNOSIS — Z7952 Long term (current) use of systemic steroids: Secondary | ICD-10-CM | POA: Insufficient documentation

## 2020-07-30 DIAGNOSIS — Z79899 Other long term (current) drug therapy: Secondary | ICD-10-CM | POA: Insufficient documentation

## 2020-07-30 DIAGNOSIS — D242 Benign neoplasm of left breast: Secondary | ICD-10-CM | POA: Insufficient documentation

## 2020-07-30 HISTORY — PX: BREAST LUMPECTOMY WITH RADIOACTIVE SEED LOCALIZATION: SHX6424

## 2020-07-30 HISTORY — DX: Unspecified lump in unspecified breast: N63.0

## 2020-07-30 LAB — POCT PREGNANCY, URINE: Preg Test, Ur: NEGATIVE

## 2020-07-30 SURGERY — BREAST LUMPECTOMY WITH RADIOACTIVE SEED LOCALIZATION
Anesthesia: General | Site: Breast | Laterality: Left

## 2020-07-30 MED ORDER — PROPOFOL 10 MG/ML IV BOLUS
INTRAVENOUS | Status: DC | PRN
  Administered 2020-07-30: 200 mg via INTRAVENOUS

## 2020-07-30 MED ORDER — ACETAMINOPHEN 500 MG PO TABS
ORAL_TABLET | ORAL | Status: AC
  Filled 2020-07-30: qty 2

## 2020-07-30 MED ORDER — DEXAMETHASONE SODIUM PHOSPHATE 10 MG/ML IJ SOLN
INTRAMUSCULAR | Status: DC | PRN
  Administered 2020-07-30: 10 mg via INTRAVENOUS

## 2020-07-30 MED ORDER — LIDOCAINE 2% (20 MG/ML) 5 ML SYRINGE
INTRAMUSCULAR | Status: DC | PRN
  Administered 2020-07-30: 100 mg via INTRAVENOUS

## 2020-07-30 MED ORDER — ONDANSETRON HCL 4 MG/2ML IJ SOLN
INTRAMUSCULAR | Status: DC | PRN
  Administered 2020-07-30: 4 mg via INTRAVENOUS

## 2020-07-30 MED ORDER — FENTANYL CITRATE (PF) 100 MCG/2ML IJ SOLN
INTRAMUSCULAR | Status: DC | PRN
  Administered 2020-07-30: 50 ug via INTRAVENOUS
  Administered 2020-07-30 (×2): 25 ug via INTRAVENOUS

## 2020-07-30 MED ORDER — SODIUM CHLORIDE 0.9 % IV SOLN
INTRAVENOUS | Status: AC
  Filled 2020-07-30: qty 10

## 2020-07-30 MED ORDER — DEXAMETHASONE SODIUM PHOSPHATE 10 MG/ML IJ SOLN
INTRAMUSCULAR | Status: AC
  Filled 2020-07-30: qty 1

## 2020-07-30 MED ORDER — CLINDAMYCIN PHOSPHATE 900 MG/50ML IV SOLN
INTRAVENOUS | Status: AC
  Filled 2020-07-30: qty 50

## 2020-07-30 MED ORDER — IBUPROFEN 800 MG PO TABS: 800.0000 mg | ORAL_TABLET | Freq: Three times a day (TID) | ORAL | 0 refills | Status: DC | PRN

## 2020-07-30 MED ORDER — PHENYLEPHRINE 40 MCG/ML (10ML) SYRINGE FOR IV PUSH (FOR BLOOD PRESSURE SUPPORT)
PREFILLED_SYRINGE | INTRAVENOUS | Status: DC | PRN
  Administered 2020-07-30: 40 ug via INTRAVENOUS
  Administered 2020-07-30 (×2): 80 ug via INTRAVENOUS
  Administered 2020-07-30: 40 ug via INTRAVENOUS

## 2020-07-30 MED ORDER — CLINDAMYCIN PHOSPHATE 900 MG/50ML IV SOLN
900.0000 mg | INTRAVENOUS | Status: AC
  Administered 2020-07-30: 900 mg via INTRAVENOUS

## 2020-07-30 MED ORDER — MIDAZOLAM HCL 5 MG/5ML IJ SOLN
INTRAMUSCULAR | Status: DC | PRN
  Administered 2020-07-30: 2 mg via INTRAVENOUS

## 2020-07-30 MED ORDER — LIDOCAINE HCL (PF) 2 % IJ SOLN
INTRAMUSCULAR | Status: AC
  Filled 2020-07-30: qty 5

## 2020-07-30 MED ORDER — BUPIVACAINE-EPINEPHRINE (PF) 0.25% -1:200000 IJ SOLN
INTRAMUSCULAR | Status: DC | PRN
  Administered 2020-07-30: 10 mL

## 2020-07-30 MED ORDER — HYDROCODONE-ACETAMINOPHEN 5-325 MG PO TABS: 1.0000 | ORAL_TABLET | Freq: Four times a day (QID) | ORAL | 0 refills | Status: DC | PRN

## 2020-07-30 MED ORDER — ACETAMINOPHEN 500 MG PO TABS
1000.0000 mg | ORAL_TABLET | ORAL | Status: AC
  Administered 2020-07-30: 1000 mg via ORAL

## 2020-07-30 MED ORDER — FENTANYL CITRATE (PF) 100 MCG/2ML IJ SOLN
INTRAMUSCULAR | Status: AC
  Filled 2020-07-30: qty 2

## 2020-07-30 MED ORDER — AMISULPRIDE (ANTIEMETIC) 5 MG/2ML IV SOLN: 10.0000 mg | Freq: Once | INTRAVENOUS | Status: DC | PRN

## 2020-07-30 MED ORDER — ONDANSETRON HCL 4 MG/2ML IJ SOLN: 4.0000 mg | Freq: Once | INTRAMUSCULAR | Status: DC | PRN

## 2020-07-30 MED ORDER — ONDANSETRON HCL 4 MG/2ML IJ SOLN
INTRAMUSCULAR | Status: AC
  Filled 2020-07-30: qty 2

## 2020-07-30 MED ORDER — FENTANYL CITRATE (PF) 100 MCG/2ML IJ SOLN: 25.0000 ug | INTRAMUSCULAR | Status: DC | PRN

## 2020-07-30 MED ORDER — MIDAZOLAM HCL 2 MG/2ML IJ SOLN
INTRAMUSCULAR | Status: AC
  Filled 2020-07-30: qty 2

## 2020-07-30 MED ORDER — LACTATED RINGERS IV SOLN: INTRAVENOUS | Status: DC

## 2020-07-30 MED ORDER — VANCOMYCIN HCL 500 MG IV SOLR
INTRAVENOUS | Status: DC | PRN
  Administered 2020-07-30: 500 mg via TOPICAL

## 2020-07-30 MED ORDER — OXYCODONE HCL 5 MG PO TABS: 5.0000 mg | ORAL_TABLET | Freq: Once | ORAL | Status: DC | PRN

## 2020-07-30 MED ORDER — OXYCODONE HCL 5 MG/5ML PO SOLN: 5.0000 mg | Freq: Once | ORAL | Status: DC | PRN

## 2020-07-30 SURGICAL SUPPLY — 50 items
ADH SKN CLS APL DERMABOND .7 (GAUZE/BANDAGES/DRESSINGS) ×1
APL PRP STRL LF DISP 70% ISPRP (MISCELLANEOUS) ×1
APPLIER CLIP 9.375 MED OPEN (MISCELLANEOUS)
APR CLP MED 9.3 20 MLT OPN (MISCELLANEOUS)
BINDER BREAST LRG (GAUZE/BANDAGES/DRESSINGS) IMPLANT
BINDER BREAST MEDIUM (GAUZE/BANDAGES/DRESSINGS) IMPLANT
BINDER BREAST XLRG (GAUZE/BANDAGES/DRESSINGS) ×2 IMPLANT
BINDER BREAST XXLRG (GAUZE/BANDAGES/DRESSINGS) IMPLANT
BLADE SURG 15 STRL LF DISP TIS (BLADE) ×1 IMPLANT
BLADE SURG 15 STRL SS (BLADE) ×2
CANISTER SUC SOCK COL 7IN (MISCELLANEOUS) IMPLANT
CANISTER SUCT 1200ML W/VALVE (MISCELLANEOUS) IMPLANT
CHLORAPREP W/TINT 26 (MISCELLANEOUS) ×2 IMPLANT
CLIP APPLIE 9.375 MED OPEN (MISCELLANEOUS) IMPLANT
COVER BACK TABLE 60X90IN (DRAPES) ×2 IMPLANT
COVER MAYO STAND STRL (DRAPES) ×2 IMPLANT
COVER PROBE W GEL 5X96 (DRAPES) ×2 IMPLANT
DECANTER SPIKE VIAL GLASS SM (MISCELLANEOUS) IMPLANT
DERMABOND ADVANCED (GAUZE/BANDAGES/DRESSINGS) ×1
DERMABOND ADVANCED .7 DNX12 (GAUZE/BANDAGES/DRESSINGS) ×1 IMPLANT
DRAPE LAPAROSCOPIC ABDOMINAL (DRAPES) IMPLANT
DRAPE LAPAROTOMY 100X72 PEDS (DRAPES) ×2 IMPLANT
DRAPE UTILITY XL STRL (DRAPES) ×2 IMPLANT
ELECT COATED BLADE 2.86 ST (ELECTRODE) ×2 IMPLANT
ELECT REM PT RETURN 9FT ADLT (ELECTROSURGICAL) ×2
ELECTRODE REM PT RTRN 9FT ADLT (ELECTROSURGICAL) ×1 IMPLANT
GLOVE SRG 8 PF TXTR STRL LF DI (GLOVE) ×1 IMPLANT
GLOVE SURG LTX SZ8 (GLOVE) ×2 IMPLANT
GLOVE SURG UNDER POLY LF SZ8 (GLOVE) ×2
GOWN STRL REUS W/ TWL LRG LVL3 (GOWN DISPOSABLE) ×2 IMPLANT
GOWN STRL REUS W/ TWL XL LVL3 (GOWN DISPOSABLE) ×1 IMPLANT
GOWN STRL REUS W/TWL LRG LVL3 (GOWN DISPOSABLE) ×4
GOWN STRL REUS W/TWL XL LVL3 (GOWN DISPOSABLE) ×2
HEMOSTAT ARISTA ABSORB 3G PWDR (HEMOSTASIS) IMPLANT
HEMOSTAT SNOW SURGICEL 2X4 (HEMOSTASIS) IMPLANT
KIT MARKER MARGIN INK (KITS) ×2 IMPLANT
NEEDLE HYPO 25X1 1.5 SAFETY (NEEDLE) ×2 IMPLANT
NS IRRIG 1000ML POUR BTL (IV SOLUTION) ×2 IMPLANT
PACK BASIN DAY SURGERY FS (CUSTOM PROCEDURE TRAY) ×2 IMPLANT
PENCIL SMOKE EVACUATOR (MISCELLANEOUS) ×2 IMPLANT
SLEEVE SCD COMPRESS KNEE MED (STOCKING) ×2 IMPLANT
SPONGE T-LAP 4X18 ~~LOC~~+RFID (SPONGE) ×2 IMPLANT
SUT MNCRL AB 4-0 PS2 18 (SUTURE) ×2 IMPLANT
SUT SILK 2 0 SH (SUTURE) IMPLANT
SUT VICRYL 3-0 CR8 SH (SUTURE) ×2 IMPLANT
SYR CONTROL 10ML LL (SYRINGE) ×2 IMPLANT
TOWEL GREEN STERILE FF (TOWEL DISPOSABLE) ×2 IMPLANT
TRAY FAXITRON CT DISP (TRAY / TRAY PROCEDURE) ×2 IMPLANT
TUBE CONNECTING 20X1/4 (TUBING) ×2 IMPLANT
YANKAUER SUCT BULB TIP NO VENT (SUCTIONS) ×2 IMPLANT

## 2020-07-30 NOTE — Interval H&P Note (Signed)
History and Physical Interval Note:  07/30/2020 8:14 AM  Tracy [name]  has presented today for surgery, with the diagnosis of LEFT BREAST RADIAL SCAR.  The various methods of treatment have been discussed with the patient and family. After consideration of risks, benefits and other options for treatment, the patient has consented to  Procedure(s): LEFT BREAST LUMPECTOMY WITH RADIOACTIVE SEED LOCALIZATION (Left) as a surgical intervention.  The patient's history has been reviewed, patient examined, no change in status, stable for surgery.  I have reviewed the patient's chart and labs.  Questions were answered to the patient's satisfaction.     [place]

## 2020-07-30 NOTE — Anesthesia Postprocedure Evaluation (Signed)
Anesthesia Post Note  Patient: Tracy [name]  Procedure(s) Performed: LEFT BREAST LUMPECTOMY WITH RADIOACTIVE SEED LOCALIZATION (Left: Breast)     Patient location during evaluation: PACU Anesthesia Type: General Level of consciousness: awake and alert Pain management: pain level controlled Vital Signs Assessment: post-procedure vital signs reviewed and stable Respiratory status: spontaneous breathing, nonlabored ventilation and respiratory function stable Cardiovascular status: blood pressure returned to baseline and stable Postop Assessment: no apparent nausea or vomiting Anesthetic complications: no   No notable events documented.  Last Vitals:  Vitals:   07/30/20 1000 07/30/20 1030  BP: 104/74 107/78  Pulse: 66 82  Resp: 19 16  Temp:  36.7 C  SpO2: 99% 98%    Last Pain:  Vitals:   07/30/20 1030  TempSrc:   PainSc: 0-No pain                 Lidia Collum

## 2020-07-30 NOTE — Anesthesia Preprocedure Evaluation (Signed)
Anesthesia Evaluation  Patient identified by MRN, date of birth, ID band Patient awake    Reviewed: Allergy & Precautions, NPO status , Patient's Chart, lab work & pertinent test results  History of Anesthesia Complications Negative for: history of anesthetic complications  Airway Mallampati: II  TM Distance: >3 FB Neck ROM: Full    Dental  (+) Dental Advisory Given, Teeth Intact   Pulmonary neg pulmonary ROS,    Pulmonary exam normal        Cardiovascular hypertension, Normal cardiovascular exam     Neuro/Psych Anxiety Depression negative neurological ROS     GI/Hepatic negative GI ROS, Neg liver ROS,   Endo/Other  negative endocrine ROS  Renal/GU negative Renal ROS  negative genitourinary   Musculoskeletal negative musculoskeletal ROS (+)   Abdominal   Peds  Hematology negative hematology ROS (+)   Anesthesia Other Findings  L breast lump  Reproductive/Obstetrics                           Anesthesia Physical Anesthesia Plan  ASA: 2  Anesthesia Plan: General   Post-op Pain Management:    Induction: Intravenous  PONV Risk Score and Plan: 3 and Ondansetron, Dexamethasone, Midazolam and Treatment may vary due to age or medical condition  Airway Management Planned: LMA  Additional Equipment: None  Intra-op Plan:   Post-operative Plan: Extubation in OR  Informed Consent: I have reviewed the patients History and Physical, chart, labs and discussed the procedure including the risks, benefits and alternatives for the proposed anesthesia with the patient or authorized representative who has indicated his/her understanding and acceptance.     Dental advisory given  Plan Discussed with:   Anesthesia Plan Comments:         Anesthesia Quick Evaluation

## 2020-07-30 NOTE — Anesthesia Procedure Notes (Signed)
Procedure Name: LMA Insertion Date/Time: 07/30/2020 8:38 AM Performed by: Genelle Bal, CRNA Pre-anesthesia Checklist: Patient identified, Emergency Drugs available, Suction available and Patient being monitored Patient Re-evaluated:Patient Re-evaluated prior to induction Oxygen Delivery Method: Circle system utilized Preoxygenation: Pre-oxygenation with 100% oxygen Induction Type: IV induction Ventilation: Mask ventilation without difficulty LMA: LMA inserted LMA Size: 4.0 Number of attempts: 1 Airway Equipment and Method: Bite block Placement Confirmation: positive ETCO2 Tube secured with: Tape Dental Injury: Teeth and Oropharynx as per pre-operative assessment

## 2020-07-30 NOTE — Transfer of Care (Signed)
Immediate Anesthesia Transfer of Care Note  Patient: Tracy [name]  Procedure(s) Performed: LEFT BREAST LUMPECTOMY WITH RADIOACTIVE SEED LOCALIZATION (Left: Breast)  Patient Location: PACU  Anesthesia Type:General  Level of Consciousness: drowsy and patient cooperative  Airway & Oxygen Therapy: Patient Spontanous Breathing and Patient connected to face mask oxygen  Post-op Assessment: Report given to RN and Post -op Vital signs reviewed and stable  Post vital signs: Reviewed and stable  Last Vitals:  Vitals Value Taken Time  BP 118/83 07/30/20 0924  Temp    Pulse 70 07/30/20 0926  Resp 21 07/30/20 0926  SpO2 98 % 07/30/20 0926  Vitals shown include unvalidated device data.  Last Pain:  Vitals:   07/30/20 0721  TempSrc: Oral  PainSc: 0-No pain      Patients Stated Pain Goal: 1 (86/76/72 0947)  Complications: No notable events documented.

## 2020-07-30 NOTE — Op Note (Signed)
eoperative diagnosis: Left breast radial scar   Postoperative diagnosis: Same   Procedure: Left breast seed localized lumpectomy  Surgeon: Erroll Luna M.D.  Anesthesia: Gen. With 0.25% Sensorcaine local  EBL: 20 cc  Specimen: Left breast tissue with clip and radioactive seed in the specimen. Verified with neoprobe and radiographic image showing both seed and clip in specimen  Indications for procedure: The patient presents for left breast excisional lumpectomy after core biopsy showed Radial scar.  Discussed the rationale for considering excision. Small risk of malignancy associated with papilloma lesion after core biopsy. Discussed observation. Discussed seed localization. Patient desired excision of left breast radial scar. .The procedure has been discussed with the patient. Alternatives to surgery have been discussed with the patient.  Risks of surgery include bleeding,  Infection,  Seroma formation, death,  and the need for further surgery.   The patient understands and wishes to proceed.   Description of procedure: Patient underwent seed placement as an outpatient. Patient presents today for left breast seed localized lumpectomy. Patient and holding area. Questions are answered and neoprobe used to verify seed location. Patient taken back to the operating room and placed upon the OR table. After induction of general anesthesia, left breast prepped and draped in a sterile fashion. Timeout was done to verify proper sizing procedure. Neoprobe used and hot spot identified and left breast upper-outer quadrant. This was marked with pen. Curvilinear incision made left NAC.  Dissection used with the help of a neoprobe around the tissue where the seed and clip were located. Tissue removed in its entirety with gross  NEGATIVE margins.. Neoprobe used and seed within specimen. Radiographs taken which show clip and seed  in the specimen. Vancomycin powder placed. Hemostasis achieved and cavity closed  with 3-0 Vicryl and 4-0 Monocryl. Dermabond applied. All final counts found to be correct. Specimen transported to pathology. Patient awoke extubated taken to recovery in satisfactory condition.

## 2020-07-30 NOTE — Discharge Instructions (Signed)
[facility] Office Phone Number [phone]  BREAST BIOPSY/ PARTIAL MASTECTOMY: POST OP INSTRUCTIONS  Always review your discharge instruction sheet given to you by the facility where your surgery was performed.  IF YOU HAVE DISABILITY OR FAMILY LEAVE FORMS, YOU MUST BRING THEM TO THE OFFICE FOR PROCESSING.  DO NOT GIVE THEM TO YOUR DOCTOR.  A prescription for pain medication may be given to you upon discharge.  Take your pain medication as prescribed, if needed.  If narcotic pain medicine is not needed, then you may take acetaminophen (Tylenol) or ibuprofen (Advil) as needed. Take your usually prescribed medications unless otherwise directed If you need a refill on your pain medication, please contact your pharmacy.  They will contact our office to request authorization.  Prescriptions will not be filled after 5pm or on week-ends. You should eat very light the first 24 hours after surgery, such as soup, crackers, pudding, etc.  Resume your normal diet the day after surgery. Most patients will experience some swelling and bruising in the breast.  Ice packs and a good support bra will help.  Swelling and bruising can take several days to resolve.  It is common to experience some constipation if taking pain medication after surgery.  Increasing fluid intake and taking a stool softener will usually help or prevent this problem from occurring.  A mild laxative (Milk of Magnesia or Miralax) should be taken according to package directions if there are no bowel movements after 48 hours. Unless discharge instructions indicate otherwise, you may remove your bandages 24-48 hours after surgery, and you may shower at that time.  You may have steri-strips (small skin tapes) in place directly over the incision.  These strips should be left on the skin for 7-10 days.  If your surgeon used skin glue on the incision, you may shower in 24 hours.  The glue will flake off over the next 2-3 weeks.  Any  sutures or staples will be removed at the office during your follow-up visit. ACTIVITIES:  You may resume regular daily activities (gradually increasing) beginning the next day.  Wearing a good support bra or sports bra minimizes pain and swelling.  You may have sexual intercourse when it is comfortable. You may drive when you no longer are taking prescription pain medication, you can comfortably wear a seatbelt, and you can safely maneuver your car and apply brakes. RETURN TO WORK:  ______________________________________________________________________________________ You should see your doctor in the office for a follow-up appointment approximately two weeks after your surgery.  Your doctor's nurse will typically make your follow-up appointment when she calls you with your pathology report.  Expect your pathology report 2-3 business days after your surgery.  You may call to check if you do not hear from us after three days. OTHER INSTRUCTIONS: _______________________________________________________________________________________________ _____________________________________________________________________________________________________________________________________ _____________________________________________________________________________________________________________________________________ _____________________________________________________________________________________________________________________________________  WHEN TO CALL YOUR DOCTOR: Fever over 101.0 Nausea and/or vomiting. Extreme swelling or bruising. Continued bleeding from incision. Increased pain, redness, or drainage from the incision.  The clinic staff is available to answer your questions during regular business hours.  Please don't hesitate to call and ask to speak to one of the nurses for clinical concerns.  If you have a medical emergency, go to the nearest emergency room or call 911.  A surgeon from [facility] is always on call at the hospital.  For further questions, please visit [email or web address]   

## 2020-08-04 LAB — SURGICAL PATHOLOGY

## 2020-09-17 ENCOUNTER — Other Ambulatory Visit: Payer: Self-pay | Admitting: Emergency Medicine

## 2020-09-17 DIAGNOSIS — I10 Essential (primary) hypertension: Secondary | ICD-10-CM

## 2020-10-26 ENCOUNTER — Ambulatory Visit (INDEPENDENT_AMBULATORY_CARE_PROVIDER_SITE_OTHER): Payer: No Typology Code available for payment source | Admitting: Emergency Medicine

## 2020-10-26 ENCOUNTER — Other Ambulatory Visit: Payer: Self-pay

## 2020-10-26 ENCOUNTER — Encounter: Payer: Self-pay | Admitting: Emergency Medicine

## 2020-10-26 VITALS — BP 124/82 | HR 76 | Temp 98.9°F | Ht 62.0 in | Wt 176.0 lb

## 2020-10-26 DIAGNOSIS — Z23 Encounter for immunization: Secondary | ICD-10-CM

## 2020-10-26 DIAGNOSIS — Z130 Encounter for screening for diseases of the blood and blood-forming organs and certain disorders involving the immune mechanism: Secondary | ICD-10-CM

## 2020-10-26 DIAGNOSIS — Z13220 Encounter for screening for lipoid disorders: Secondary | ICD-10-CM | POA: Diagnosis not present

## 2020-10-26 DIAGNOSIS — I10 Essential (primary) hypertension: Secondary | ICD-10-CM | POA: Diagnosis not present

## 2020-10-26 DIAGNOSIS — R29818 Other symptoms and signs involving the nervous system: Secondary | ICD-10-CM

## 2020-10-26 DIAGNOSIS — Z0000 Encounter for general adult medical examination without abnormal findings: Secondary | ICD-10-CM | POA: Diagnosis not present

## 2020-10-26 DIAGNOSIS — Z13228 Encounter for screening for other metabolic disorders: Secondary | ICD-10-CM | POA: Diagnosis not present

## 2020-10-26 DIAGNOSIS — R7303 Prediabetes: Secondary | ICD-10-CM | POA: Diagnosis not present

## 2020-10-26 DIAGNOSIS — G8929 Other chronic pain: Secondary | ICD-10-CM

## 2020-10-26 DIAGNOSIS — R519 Headache, unspecified: Secondary | ICD-10-CM

## 2020-10-26 DIAGNOSIS — Z1329 Encounter for screening for other suspected endocrine disorder: Secondary | ICD-10-CM | POA: Diagnosis not present

## 2020-10-26 LAB — CBC WITH DIFFERENTIAL/PLATELET
Basophils Absolute: 0 10*3/uL (ref 0.0–0.1)
Basophils Relative: 0.7 % (ref 0.0–3.0)
Eosinophils Absolute: 0.1 10*3/uL (ref 0.0–0.7)
Eosinophils Relative: 1.7 % (ref 0.0–5.0)
HCT: 39.3 % (ref 36.0–46.0)
Hemoglobin: 13.2 g/dL (ref 12.0–15.0)
Lymphocytes Relative: 34.6 % (ref 12.0–46.0)
Lymphs Abs: 2.3 10*3/uL (ref 0.7–4.0)
MCHC: 33.5 g/dL (ref 30.0–36.0)
MCV: 85.6 fl (ref 78.0–100.0)
Monocytes Absolute: 0.6 10*3/uL (ref 0.1–1.0)
Monocytes Relative: 8.9 % (ref 3.0–12.0)
Neutro Abs: 3.6 10*3/uL (ref 1.4–7.7)
Neutrophils Relative %: 54.1 % (ref 43.0–77.0)
Platelets: 293 10*3/uL (ref 150.0–400.0)
RBC: 4.59 Mil/uL (ref 3.87–5.11)
RDW: 13.5 % (ref 11.5–15.5)
WBC: 6.6 10*3/uL (ref 4.0–10.5)

## 2020-10-26 LAB — COMPREHENSIVE METABOLIC PANEL
ALT: 27 U/L (ref 0–35)
AST: 21 U/L (ref 0–37)
Albumin: 4.2 g/dL (ref 3.5–5.2)
Alkaline Phosphatase: 57 U/L (ref 39–117)
BUN: 12 mg/dL (ref 6–23)
CO2: 29 mEq/L (ref 19–32)
Calcium: 9.4 mg/dL (ref 8.4–10.5)
Chloride: 102 mEq/L (ref 96–112)
Creatinine, Ser: 0.79 mg/dL (ref 0.40–1.20)
GFR: 89.27 mL/min (ref >60.00)
Glucose, Bld: 97 mg/dL (ref 70–99)
Potassium: 3.3 mEq/L — ABNORMAL LOW (ref 3.5–5.1)
Sodium: 139 mEq/L (ref 135–145)
Total Bilirubin: 0.6 mg/dL (ref 0.2–1.2)
Total Protein: 7 g/dL (ref 6.0–8.3)

## 2020-10-26 LAB — HEMOGLOBIN A1C: Hgb A1c MFr Bld: 5.8 % (ref 4.6–6.5)

## 2020-10-26 LAB — LIPID PANEL
Cholesterol: 230 mg/dL — ABNORMAL HIGH (ref 0–200)
HDL: 63.5 mg/dL (ref >39.00)
LDL Cholesterol: 138 mg/dL — ABNORMAL HIGH (ref 0–99)
NonHDL: 166.52
Total CHOL/HDL Ratio: 4
Triglycerides: 145 mg/dL (ref 0.0–149.0)
VLDL: 29 mg/dL (ref 0.0–40.0)

## 2020-10-26 LAB — TSH: TSH: 0.93 u[IU]/mL (ref 0.35–5.50)

## 2020-10-26 NOTE — Progress Notes (Addendum)
Tracy [name] 45 y.o.   Chief Complaint  Patient presents with   Annual Exam    HISTORY OF PRESENT ILLNESS: This is a 45 y.o. female here for annual exam. Has the following chronic medical problems: 1.  Hypertension on Zestoretic 20-12.5 mg daily 2.  Chronic headaches 3.  Possible sleep apnea but has not been tested yet 4.  Breast lump status postlumpectomy, negative for cancer. 5.  Seasonal allergies with chronic sinus symptoms. 6.  Prediabetes No acute problems or any other medical concerns today. BP Readings from Last 3 Encounters:  10/26/20 124/82  07/30/20 107/78  05/10/20 123/84   Wt Readings from Last 3 Encounters:  10/26/20 176 lb (79.8 kg)  07/30/20 174 lb 13.2 oz (79.3 kg)  10/24/19 172 lb (78 kg)     HPI   Prior to Admission medications   Medication Sig Start Date End Date Taking? Authorizing Provider  albuterol (VENTOLIN HFA) 108 (90 Base) MCG/ACT inhaler Inhale 1-2 puffs into the lungs every 6 (six) hours as needed for wheezing or shortness of breath. 02/08/20  Yes Wieters, Hallie C, PA-C  amLODipine (NORVASC) 10 MG tablet TAKE 1 TABLET BY MOUTH EVERY DAY 04/06/20  Yes Mali Eppard, Ines Bloomer, MD  cetirizine (ZYRTEC ALLERGY) 10 MG tablet Take 1 tablet (10 mg total) by mouth daily. 07/24/19  Yes Hall-Potvin, [place], PA-C  diphenhydrAMINE (BENADRYL) 25 MG tablet Take 25 mg by mouth every 6 (six) hours as needed.   Yes [provider]  DULoxetine (CYMBALTA) 30 MG capsule TAKE 1 CAPSULE EVERY DAY 01/22/20  Yes Johnathyn Viscomi, Ines Bloomer, MD  fexofenadine (ALLEGRA) 180 MG tablet Take 180 mg by mouth daily.   Yes [provider]  HYDROcodone-acetaminophen (NORCO/VICODIN) 5-325 MG tablet Take 1 tablet by mouth every 6 (six) hours as needed for moderate pain. 07/30/20  Yes Cornett, Marcello Moores, MD  ibuprofen (ADVIL) 800 MG tablet Take 1 tablet (800 mg total) by mouth every 8 (eight) hours as needed. 07/30/20  Yes Cornett, Marcello Moores, MD  lisinopril-hydrochlorothiazide  (ZESTORETIC) 20-12.5 MG tablet TAKE 1 TABLET BY MOUTH EVERY DAY 09/17/20  Yes Starasia Sinko, Ines Bloomer, MD  Multiple Vitamin (MULTIVITAMIN WITH MINERALS) TABS tablet Take 1 tablet by mouth daily.   Yes [provider]    Allergies  Allergen Reactions   Cefdinir Itching   Penicillins Rash, Hives and Swelling    Has patient had a PCN reaction causing immediate rash, facial/tongue/throat swelling, SOB or lightheadedness with hypotension: No Has patient had a PCN reaction causing severe rash involving mucus membranes or skin necrosis: No Has patient had a PCN reaction that required hospitalization NO Has patient had a PCN reaction occurring within the last 10 years: NO If all of the above answers are "NO", then may proceed with Cephalosporin use. Has patient had a PCN reaction causing immediate rash, facial/tongue/throat swelling, SOB or lightheadedness with hypotension: No Has patient had a PCN reaction causing severe rash involving mucus membranes or skin necrosis: No Has patient had a PCN reaction that required hospitalization NO Has patient had a PCN reaction occurring within the last 10 years: NO If all of the above answers are "NO", then may proceed with Cephalosporin use.    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   Essential (primary) hypertension 12/09/2016    Past Medical History:  Diagnosis Date   Breast lump    Hypertension  Past Surgical History:  Procedure Laterality Date   BREAST LUMPECTOMY WITH RADIOACTIVE SEED LOCALIZATION Left 07/30/2020   Procedure: LEFT BREAST LUMPECTOMY WITH RADIOACTIVE SEED LOCALIZATION;  Surgeon: Erroll Luna, MD;  Location: [facility];  Service: General;  Laterality: Left;   CESAREAN SECTION     TONSILLECTOMY     UTERINE FIBROID SURGERY      Social History   Socioeconomic History   Marital status:  Divorced    Spouse name: Not on file   Number of children: Not on file   Years of education: Not on file   Highest education level: Not on file  Occupational History   Not on file  Tobacco Use   Smoking status: Never   Smokeless tobacco: Never  Vaping Use   Vaping Use: Never used  Substance and Sexual Activity   Alcohol use: No   Drug use: No   Sexual activity: Yes    Birth control/protection: None  Other Topics Concern   Not on file  Social History Narrative   Not on file   Social Determinants of Health   Financial Resource Strain: Not on file  Food Insecurity: Not on file  Transportation Needs: Not on file  Physical Activity: Not on file  Stress: Not on file  Social Connections: Not on file  Intimate Partner Violence: Not on file    Family History  Problem Relation Age of Onset   Hypertension Mother    Diabetes Mother    Hypertension Father    Diabetes Father      Review of Systems  Constitutional: Negative.  Negative for chills and fever.  HENT: Negative.  Negative for congestion.   Eyes: Negative.   Respiratory: Negative.  Negative for cough, shortness of breath and stridor.   Cardiovascular: Negative.  Negative for chest pain and palpitations.  Gastrointestinal: Negative.  Negative for abdominal pain, diarrhea, nausea and vomiting.  Genitourinary: Negative.  Negative for dysuria and hematuria.  Musculoskeletal:  Negative for back pain and neck pain.  Skin: Negative.  Negative for rash.  Neurological:  Positive for headaches.  Endo/Heme/Allergies:  Positive for environmental allergies.  All other systems reviewed and are negative.  Today's Vitals   10/26/20 0800  BP: 124/82  Pulse: 76  Temp: 98.9 F (37.2 C)  TempSrc: Oral  SpO2: 98%  Weight: 176 lb (79.8 kg)  Height: 5\' 2"  (1.575 m)   Body mass index is 32.19 kg/m. Wt Readings from Last 3 Encounters:  10/26/20 176 lb (79.8 kg)  07/30/20 174 lb 13.2 oz (79.3 kg)  10/24/19 172 lb (78 kg)     Physical Exam Vitals reviewed.  Constitutional:      Appearance: Normal appearance.  HENT:     Head: Normocephalic and atraumatic.     Right Ear: Tympanic membrane, ear canal and external ear normal.     Left Ear: Tympanic membrane, ear canal and external ear normal.     Mouth/Throat:     Mouth: Mucous membranes are moist.     Pharynx: Oropharynx is clear.  Eyes:     Extraocular Movements: Extraocular movements intact.     Conjunctiva/sclera: Conjunctivae normal.     Pupils: Pupils are equal, round, and reactive to light.  Cardiovascular:     Rate and Rhythm: Normal rate and regular rhythm.     Pulses: Normal pulses.     Heart sounds: Normal heart sounds.  Pulmonary:     Effort: Pulmonary effort is normal.     Breath  sounds: Normal breath sounds.  Abdominal:     General: Bowel sounds are normal. There is no distension.     Palpations: Abdomen is soft. There is no mass.     Tenderness: There is no abdominal tenderness.  Musculoskeletal:     Cervical back: Normal range of motion and neck supple. No tenderness.     Right lower leg: No edema.     Left lower leg: No edema.  Lymphadenopathy:     Cervical: No cervical adenopathy.  Skin:    General: Skin is warm and dry.     Capillary Refill: Capillary refill takes less than 2 seconds.  Neurological:     General: No focal deficit present.     Mental Status: She is alert and oriented to person, place, and time.  Psychiatric:        Mood and Affect: Mood normal.        Behavior: Behavior normal.   The 10-year ASCVD risk score (Arnett DK, et al., 2019) is: 2.1%   Values used to calculate the score:     Age: 61 years     Sex: Female     Is Non-Hispanic African American: Yes     Diabetic: No     Tobacco smoker: No     Systolic Blood Pressure: 573 mmHg     Is BP treated: Yes     HDL Cholesterol: 63.5 mg/dL     Total Cholesterol: 230 mg/dL   ASSESSMENT & PLAN: Tracy [name] was seen today for annual exam.  Diagnoses and all  orders for this visit:  Routine general medical examination at a health care facility  Essential (primary) hypertension -     Comprehensive metabolic panel  Prediabetes -     Hemoglobin A1c  Screening for deficiency anemia -     CBC with Differential  Screening for lipoid disorders -     Lipid panel  Screening for endocrine, metabolic and immunity disorder -     Comprehensive metabolic panel -     TSH  Suspected sleep apnea -     Ambulatory referral to Neurology  Chronic nonintractable headache, unspecified headache type -     Ambulatory referral to Neurology  Other orders -     Flu Vaccine QUAD 2mo+IM (Fluarix, Fluzone & Alfiuria Quad PF)  Modifiable risk factors discussed with patient. Anticipatory guidance according to age provided. The following topics were also discussed: Social Determinants of Health Smoking.  Non-smoker Diet and nutrition and prediabetes and need to decrease amount of daily carbohydrate intake Benefits of exercise Cancer history and review of most recent lumpectomy procedure and biopsy results Vaccinations recommendations Cardiovascular risk assessment The 10-year ASCVD risk score (Arnett DK, et al., 2019) is: 2%   Values used to calculate the score:     Age: 63 years     Sex: Female     Is Non-Hispanic African American: Yes     Diabetic: No     Tobacco smoker: No     Systolic Blood Pressure: 220 mmHg     Is BP treated: Yes     HDL Cholesterol: 64 mg/dL     Total Cholesterol: 218 mg/dL  Mental health including depression and anxiety and importance of mind-body connection Fall and accident prevention  Patient Instructions  Health Maintenance, Female Adopting a healthy lifestyle and getting preventive care are important in promoting health and wellness. Ask your health care provider about: The right schedule for you to have regular tests and exams.  Things you can do on your own to prevent diseases and keep yourself healthy. What should  I know about diet, weight, and exercise? Eat a healthy diet  Eat a diet that includes plenty of vegetables, fruits, low-fat dairy products, and lean protein. Do not eat a lot of foods that are high in solid fats, added sugars, or sodium. Maintain a healthy weight Body mass index (BMI) is used to identify weight problems. It estimates body fat based on height and weight. Your health care provider can help determine your BMI and help you achieve or maintain a healthy weight. Get regular exercise Get regular exercise. This is one of the most important things you can do for your health. Most adults should: Exercise for at least 150 minutes each week. The exercise should increase your heart rate and make you sweat (moderate-intensity exercise). Do strengthening exercises at least twice a week. This is in addition to the moderate-intensity exercise. Spend less time sitting. Even light physical activity can be beneficial. Watch cholesterol and blood lipids Have your blood tested for lipids and cholesterol at 47 years of age, then have this test every 5 years. Have your cholesterol levels checked more often if: Your lipid or cholesterol levels are high. You are older than 47 years of age. You are at high risk for heart disease. What should I know about cancer screening? Depending on your health history and family history, you may need to have cancer screening at various ages. This may include screening for: Breast cancer. Cervical cancer. Colorectal cancer. Skin cancer. Lung cancer. What should I know about heart disease, diabetes, and high blood pressure? Blood pressure and heart disease High blood pressure causes heart disease and increases the risk of stroke. This is more likely to develop in people who have high blood pressure readings, are of African descent, or are overweight. Have your blood pressure checked: Every 3-5 years if you are 21-83 years of age. Every year if you are 94 years  old or older. Diabetes Have regular diabetes screenings. This checks your fasting blood sugar level. Have the screening done: Once every three years after age 45 if you are at a normal weight and have a low risk for diabetes. More often and at a younger age if you are overweight or have a high risk for diabetes. What should I know about preventing infection? Hepatitis B If you have a higher risk for hepatitis B, you should be screened for this virus. Talk with your health care provider to find out if you are at risk for hepatitis B infection. Hepatitis C Testing is recommended for: Everyone born from [birth year] through 1967. Anyone with known risk factors for hepatitis C. Sexually transmitted infections (STIs) Get screened for STIs, including gonorrhea and chlamydia, if: You are sexually active and are younger than 47 years of age. You are older than 47 years of age and your health care provider tells you that you are at risk for this type of infection. Your sexual activity has changed since you were last screened, and you are at increased risk for chlamydia or gonorrhea. Ask your health care provider if you are at risk. Ask your health care provider about whether you are at high risk for HIV. Your health care provider may recommend a prescription medicine to help prevent HIV infection. If you choose to take medicine to prevent HIV, you should first get tested for HIV. You should then be tested every 3 months for as long as you  are taking the medicine. Pregnancy If you are about to stop having your period (premenopausal) and you may become pregnant, seek counseling before you get pregnant. Take 400 to 800 micrograms (mcg) of folic acid every day if you become pregnant. Ask for birth control (contraception) if you want to prevent pregnancy. Osteoporosis and menopause Osteoporosis is a disease in which the bones lose minerals and strength with aging. This can result in bone fractures. If you are 84  years old or older, or if you are at risk for osteoporosis and fractures, ask your health care provider if you should: Be screened for bone loss. Take a calcium or vitamin D supplement to lower your risk of fractures. Be given hormone replacement therapy (HRT) to treat symptoms of menopause. Follow these instructions at home: Lifestyle Do not use any products that contain nicotine or tobacco, such as cigarettes, e-cigarettes, and chewing tobacco. If you need help quitting, ask your health care provider. Do not use street drugs. Do not share needles. Ask your health care provider for help if you need support or information about quitting drugs. Alcohol use Do not drink alcohol if: Your health care provider tells you not to drink. You are pregnant, may be pregnant, or are planning to become pregnant. If you drink alcohol: Limit how much you use to 0-1 drink a day. Limit intake if you are breastfeeding. Be aware of how much alcohol is in your drink. In the [place] one drink equals one 12 oz bottle of beer (355 mL), one 5 oz glass of wine (148 mL), or one 1 oz glass of hard liquor (44 mL). General instructions Schedule regular health, dental, and eye exams. Stay current with your vaccines. Tell your health care provider if: You often feel depressed. You have ever been abused or do not feel safe at home. Summary Adopting a healthy lifestyle and getting preventive care are important in promoting health and wellness. Follow your health care provider's instructions about healthy diet, exercising, and getting tested or screened for diseases. Follow your health care provider's instructions on monitoring your cholesterol and blood pressure. This information is not intended to replace advice given to you by your health care provider. Make sure you discuss any questions you have with your health care provider. Document Revised: 03/13/2020 Document Reviewed: 12/27/2017 Elsevier Patient Education   2022 [place], MD [facility]

## 2020-10-26 NOTE — Patient Instructions (Signed)
Health Maintenance, Female Adopting a healthy lifestyle and getting preventive care are important in promoting health and wellness. Ask your health care provider about: The right schedule for you to have regular tests and exams. Things you can do on your own to prevent diseases and keep yourself healthy. What should I know about diet, weight, and exercise? Eat a healthy diet  Eat a diet that includes plenty of vegetables, fruits, low-fat dairy products, and lean protein. Do not eat a lot of foods that are high in solid fats, added sugars, or sodium. Maintain a healthy weight Body mass index (BMI) is used to identify weight problems. It estimates body fat based on height and weight. Your health care provider can help determine your BMI and help you achieve or maintain a healthy weight. Get regular exercise Get regular exercise. This is one of the most important things you can do for your health. Most adults should: Exercise for at least 150 minutes each week. The exercise should increase your heart rate and make you sweat (moderate-intensity exercise). Do strengthening exercises at least twice a week. This is in addition to the moderate-intensity exercise. Spend less time sitting. Even light physical activity can be beneficial. Watch cholesterol and blood lipids Have your blood tested for lipids and cholesterol at 47 years of age, then have this test every 5 years. Have your cholesterol levels checked more often if: Your lipid or cholesterol levels are high. You are older than 47 years of age. You are at high risk for heart disease. What should I know about cancer screening? Depending on your health history and family history, you may need to have cancer screening at various ages. This may include screening for: Breast cancer. Cervical cancer. Colorectal cancer. Skin cancer. Lung cancer. What should I know about heart disease, diabetes, and high blood pressure? Blood pressure and heart  disease High blood pressure causes heart disease and increases the risk of stroke. This is more likely to develop in people who have high blood pressure readings, are of African descent, or are overweight. Have your blood pressure checked: Every 3-5 years if you are 18-39 years of age. Every year if you are 40 years old or older. Diabetes Have regular diabetes screenings. This checks your fasting blood sugar level. Have the screening done: Once every three years after age 40 if you are at a normal weight and have a low risk for diabetes. More often and at a younger age if you are overweight or have a high risk for diabetes. What should I know about preventing infection? Hepatitis B If you have a higher risk for hepatitis B, you should be screened for this virus. Talk with your health care provider to find out if you are at risk for hepatitis B infection. Hepatitis C Testing is recommended for: Everyone born from 1945 through 1965. Anyone with known risk factors for hepatitis C. Sexually transmitted infections (STIs) Get screened for STIs, including gonorrhea and chlamydia, if: You are sexually active and are younger than 47 years of age. You are older than 47 years of age and your health care provider tells you that you are at risk for this type of infection. Your sexual activity has changed since you were last screened, and you are at increased risk for chlamydia or gonorrhea. Ask your health care provider if you are at risk. Ask your health care provider about whether you are at high risk for HIV. Your health care provider may recommend a prescription medicine   to help prevent HIV infection. If you choose to take medicine to prevent HIV, you should first get tested for HIV. You should then be tested every 3 months for as long as you are taking the medicine. Pregnancy If you are about to stop having your period (premenopausal) and you may become pregnant, seek counseling before you get  pregnant. Take 400 to 800 micrograms (mcg) of folic acid every day if you become pregnant. Ask for birth control (contraception) if you want to prevent pregnancy. Osteoporosis and menopause Osteoporosis is a disease in which the bones lose minerals and strength with aging. This can result in bone fractures. If you are 65 years old or older, or if you are at risk for osteoporosis and fractures, ask your health care provider if you should: Be screened for bone loss. Take a calcium or vitamin D supplement to lower your risk of fractures. Be given hormone replacement therapy (HRT) to treat symptoms of menopause. Follow these instructions at home: Lifestyle Do not use any products that contain nicotine or tobacco, such as cigarettes, e-cigarettes, and chewing tobacco. If you need help quitting, ask your health care provider. Do not use street drugs. Do not share needles. Ask your health care provider for help if you need support or information about quitting drugs. Alcohol use Do not drink alcohol if: Your health care provider tells you not to drink. You are pregnant, may be pregnant, or are planning to become pregnant. If you drink alcohol: Limit how much you use to 0-1 drink a day. Limit intake if you are breastfeeding. Be aware of how much alcohol is in your drink. In the [place] one drink equals one 12 oz bottle of beer (355 mL), one 5 oz glass of wine (148 mL), or one 1 oz glass of hard liquor (44 mL). General instructions Schedule regular health, dental, and eye exams. Stay current with your vaccines. Tell your health care provider if: You often feel depressed. You have ever been abused or do not feel safe at home. Summary Adopting a healthy lifestyle and getting preventive care are important in promoting health and wellness. Follow your health care provider's instructions about healthy diet, exercising, and getting tested or screened for diseases. Follow your health care provider's  instructions on monitoring your cholesterol and blood pressure. This information is not intended to replace advice given to you by your health care provider. Make sure you discuss any questions you have with your health care provider. Document Revised: 03/13/2020 Document Reviewed: 12/27/2017 Elsevier Patient Education  2022 Elsevier Inc.  

## 2020-12-21 ENCOUNTER — Other Ambulatory Visit: Payer: Self-pay | Admitting: Emergency Medicine

## 2020-12-21 DIAGNOSIS — I10 Essential (primary) hypertension: Secondary | ICD-10-CM

## 2021-01-11 ENCOUNTER — Other Ambulatory Visit: Payer: Self-pay | Admitting: Emergency Medicine

## 2021-01-19 ENCOUNTER — Institutional Professional Consult (permissible substitution): Payer: No Typology Code available for payment source | Admitting: Neurology

## 2021-02-02 ENCOUNTER — Other Ambulatory Visit: Payer: Self-pay

## 2021-02-02 ENCOUNTER — Ambulatory Visit
Admission: RE | Admit: 2021-02-02 | Discharge: 2021-02-02 | Disposition: A | Discharge status: Home / Self Care | Payer: No Typology Code available for payment source | Source: Ambulatory Visit | Attending: Physician Assistant | Admitting: Physician Assistant

## 2021-02-02 VITALS — BP 138/94 | HR 82 | Temp 98.4°F | Ht 62.0 in | Wt 175.9 lb

## 2021-02-02 DIAGNOSIS — J019 Acute sinusitis, unspecified: Secondary | ICD-10-CM | POA: Diagnosis not present

## 2021-02-02 DIAGNOSIS — J209 Acute bronchitis, unspecified: Secondary | ICD-10-CM

## 2021-02-02 MED ORDER — DOXYCYCLINE HYCLATE 100 MG PO CAPS: 100.0000 mg | ORAL_CAPSULE | Freq: Two times a day (BID) | ORAL | 0 refills | Status: DC

## 2021-02-02 MED ORDER — PREDNISONE 10 MG PO TABS: 20.0000 mg | ORAL_TABLET | Freq: Every day | ORAL | 0 refills | Status: DC

## 2021-02-02 NOTE — ED Triage Notes (Signed)
Patient c/o non-productive cough, sneezing, nasal congestion/drainage, bilateral ear pain, some SOB with short distances.  Also c/o low back pain and under the breast pain from coughing.  Home COVID test was negative yesterday.

## 2021-02-02 NOTE — ED Provider Notes (Signed)
[name]    CSN: [id number] Arrival date & time: 02/02/21  2671      History   Chief Complaint Chief Complaint  Patient presents with   Appointment   Cough    HPI Tracy [name] is a 46 y.o. female.   Patient here today for evaluation of cough, sneezing, congestion and ear pain that she has had for the last 3 and half weeks.  She reports she does have some shortness of breath is developed as well with walking short distances.  She reports pain in her low back and under her breast from coughing.  She has taken 2 at home COVID test that were negative including 1 yesterday.  She has not had fever but does report 1 day of chills.  She has tried multiple allergy medications without significant relief.  The history is provided by the patient.  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      Home Medications    Prior to Admission medications   Medication Sig Start Date End Date Taking? Authorizing Provider  albuterol (VENTOLIN HFA) 108 (90 Base) MCG/ACT inhaler Inhale 1-2 puffs into the lungs every 6 (six) hours as needed for wheezing or shortness of breath. 02/08/20  Yes Wieters, Hallie C, PA-C  amLODipine (NORVASC) 10 MG  tablet TAKE 1 TABLET BY MOUTH EVERY DAY 01/12/21  Yes Sagardia, Ines Bloomer, MD  cetirizine (ZYRTEC ALLERGY) 10 MG tablet Take 1 tablet (10 mg total) by mouth daily. 07/24/19  Yes Hall-Potvin, [place], PA-C  diphenhydrAMINE (BENADRYL) 25 MG tablet Take 25 mg by mouth every 6 (six) hours as needed.   Yes [provider]  doxycycline (VIBRAMYCIN) 100 MG capsule Take 1 capsule (100 mg total) by mouth 2 (two) times daily. 02/02/21  Yes Francene Finders, PA-C  fexofenadine (ALLEGRA) 180 MG tablet Take 180 mg by mouth daily.   Yes [provider]  ibuprofen (ADVIL) 800 MG tablet Take 1 tablet (800 mg total) by mouth every 8 (eight) hours as needed. 07/30/20  Yes Cornett, Marcello Moores, MD  lisinopril-hydrochlorothiazide (ZESTORETIC) 20-12.5 MG tablet TAKE 1 TABLET BY MOUTH EVERY DAY 12/21/20  Yes Sagardia, Ines Bloomer, MD  Multiple Vitamin (MULTIVITAMIN WITH MINERALS) TABS tablet Take 1 tablet by mouth daily.   Yes [provider]  predniSONE (DELTASONE) 10 MG tablet Take 2 tablets (20 mg total) by mouth daily. 02/02/21  Yes Francene Finders, PA-C  DULoxetine (CYMBALTA) 30 MG capsule TAKE 1 CAPSULE EVERY DAY 01/22/20   Horald Pollen, MD  HYDROcodone-acetaminophen (NORCO/VICODIN) 5-325 MG tablet Take 1 tablet by mouth every 6 (six) hours as needed for moderate pain. 07/30/20   Erroll Luna, MD  Family History Family History  Problem Relation Age of Onset   Hypertension Mother    Diabetes Mother    Hypertension Father    Diabetes Father     Social History Social History   Tobacco Use   Smoking status: Never   Smokeless tobacco: Never  Vaping Use   Vaping Use: Never used  Substance Use Topics   Alcohol use: No   Drug use: No     Allergies   Cefdinir, Penicillins, and Latex   Review of Systems Review of Systems  Constitutional:  Positive for chills. Negative for fever.  HENT:  Positive for congestion, ear pain, sinus pressure and sore throat.   Eyes:   Negative for discharge and redness.  Respiratory:  Positive for cough and shortness of breath.   Gastrointestinal:  Negative for abdominal pain, diarrhea, nausea and vomiting.    Physical Exam Triage Vital Signs ED Triage Vitals  Enc Vitals Group     BP 02/02/21 0902 (!) 138/94     Pulse Rate 02/02/21 0902 82     Resp --      Temp 02/02/21 0902 98.4 F (36.9 C)     Temp Source 02/02/21 0902 Oral     SpO2 02/02/21 0902 98 %     Weight 02/02/21 0904 175 lb 14.8 oz (79.8 kg)     Height 02/02/21 0904 5\' 2"  (1.575 m)     Head Circumference --      Peak Flow --      Pain Score 02/02/21 0904 4     Pain Loc --      Pain Edu? --      Excl. in [place]? --    No data found.  Updated Vital Signs BP (!) 138/94 (BP Location: Right Arm)    Pulse 82    Temp 98.4 F (36.9 C) (Oral)    Ht 5\' 2"  (1.575 m)    Wt 175 lb 14.8 oz (79.8 kg)    SpO2 98%    BMI 32.18 kg/m      Physical Exam Vitals and nursing note reviewed.  Constitutional:      General: She is not in acute distress.    Appearance: Normal appearance. She is not ill-appearing.  HENT:     Head: Normocephalic and atraumatic.     Right Ear: Tympanic membrane normal.     Left Ear: Tympanic membrane normal.     Nose: Congestion present.     Mouth/Throat:     Mouth: Mucous membranes are moist.     Pharynx: No oropharyngeal exudate or posterior oropharyngeal erythema.  Eyes:     Conjunctiva/sclera: Conjunctivae normal.  Cardiovascular:     Rate and Rhythm: Normal rate and regular rhythm.     Heart sounds: Normal heart sounds. No murmur heard. Pulmonary:     Effort: Pulmonary effort is normal. No respiratory distress.     Breath sounds: Normal breath sounds. No wheezing, rhonchi or rales.  Skin:    General: Skin is warm and dry.  Neurological:     Mental Status: She is alert.  Psychiatric:        Mood and Affect: Mood normal.        Thought Content: Thought content normal.     UC Treatments / Results  Labs (all labs ordered  are listed, but only abnormal results are displayed) Labs Reviewed - No data to display  EKG   Radiology No results found.  Procedures Procedures (including critical care time)  Medications Ordered in UC Medications - No data to display  Initial Impression / Assessment and Plan / UC Course  I have reviewed the triage vital signs and the nursing notes.  Pertinent labs & imaging results that were available during my care of the patient were reviewed by me and considered in my medical decision making (see chart for details).  Will treat to cover both sinusitis as well as bronchitis given duration of symptoms and complaints.  Prednisone and doxycycline prescribed.  Recommended follow-up if symptoms fail to improve or worsen.  Final Clinical Impressions(s) / UC Diagnoses   Final diagnoses:  Acute sinusitis, recurrence not specified, unspecified location  Acute bronchitis, unspecified organism   Discharge Instructions   None    ED Prescriptions     Medication Sig Dispense Auth. Provider   predniSONE (DELTASONE) 10 MG tablet Take 2 tablets (20 mg total) by mouth daily. 15 tablet Ewell Poe F, PA-C   doxycycline (VIBRAMYCIN) 100 MG capsule Take 1 capsule (100 mg total) by mouth 2 (two) times daily. 20 capsule Francene Finders, PA-C      PDMP not reviewed this encounter.   Francene Finders, PA-C 02/02/21 [phone]

## 2021-02-10 ENCOUNTER — Ambulatory Visit
Admission: RE | Admit: 2021-02-10 | Discharge: 2021-02-10 | Disposition: A | Discharge status: Home / Self Care | Payer: No Typology Code available for payment source | Source: Ambulatory Visit | Attending: Physician Assistant | Admitting: Physician Assistant

## 2021-02-10 ENCOUNTER — Other Ambulatory Visit: Payer: Self-pay

## 2021-02-10 VITALS — BP 123/84 | HR 99 | Temp 98.9°F | Resp 18

## 2021-02-10 DIAGNOSIS — J101 Influenza due to other identified influenza virus with other respiratory manifestations: Secondary | ICD-10-CM | POA: Diagnosis not present

## 2021-02-10 LAB — POCT INFLUENZA A/B
Influenza A, POC: POSITIVE — AB
Influenza B, POC: NEGATIVE

## 2021-02-10 MED ORDER — OSELTAMIVIR PHOSPHATE 75 MG PO CAPS: 75.0000 mg | ORAL_CAPSULE | Freq: Two times a day (BID) | ORAL | 0 refills | Status: DC

## 2021-02-10 MED ORDER — ALBUTEROL SULFATE HFA 108 (90 BASE) MCG/ACT IN AERS: 1.0000 | INHALATION_SPRAY | Freq: Four times a day (QID) | RESPIRATORY_TRACT | 0 refills | Status: DC | PRN

## 2021-02-10 NOTE — ED Provider Notes (Signed)
[name]    CSN: [id number] Arrival date & time: 02/10/21  0850      History   Chief Complaint Chief Complaint  Patient presents with   Cough   9a appt    HPI Tracy [name] is a 46 y.o. female.   Patient here today for evaluation of fever, cough, congestion that started 3 days ago.  She reports that fever just started last night.  She notes T-max of 103.5 Fahrenheit.  She has tried Tylenol without resolution but with some temporary relief.  She took a COVID test at home that was negative.  She was recently treated with antibiotics as well as steroids for probable bronchitis.  She does note that those symptoms cleared before the symptoms started.  Daughter has recently been ill with similar symptoms.    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      Home Medications    Prior to Admission medications   Medication Sig Start Date End Date Taking? Authorizing Provider  oseltamivir (TAMIFLU) 75 MG capsule Take 1 capsule (75 mg total) by mouth every 12 (twelve) hours. 02/10/21  Yes Francene Finders, PA-C  albuterol (VENTOLIN HFA) 108 (90 Base) MCG/ACT inhaler Inhale 1-2 puffs into the lungs every 6 (six) hours as needed for wheezing or shortness of breath. 02/08/20   Wieters, Hallie C, PA-C  amLODipine (NORVASC) 10 MG tablet TAKE 1 TABLET BY MOUTH EVERY DAY  01/12/21   Horald Pollen, MD  cetirizine (ZYRTEC ALLERGY) 10 MG tablet Take 1 tablet (10 mg total) by mouth daily. 07/24/19   Hall-Potvin, [place], PA-C  diphenhydrAMINE (BENADRYL) 25 MG tablet Take 25 mg by mouth every 6 (six) hours as needed.    [provider]  doxycycline (VIBRAMYCIN) 100 MG capsule Take 1 capsule (100 mg total) by mouth 2 (two) times daily. 02/02/21   Francene Finders, PA-C  DULoxetine (CYMBALTA) 30 MG capsule TAKE 1 CAPSULE EVERY DAY 01/22/20   Horald Pollen, MD  fexofenadine (ALLEGRA) 180 MG tablet Take 180 mg by mouth daily.    [provider]  HYDROcodone-acetaminophen (NORCO/VICODIN) 5-325 MG tablet Take 1 tablet by mouth every 6 (six) hours as needed for moderate pain. 07/30/20   Cornett, Marcello Moores, MD  ibuprofen (ADVIL) 800 MG tablet Take 1 tablet (800 mg total) by mouth every 8 (eight) hours as needed. 07/30/20   Erroll Luna, MD  lisinopril-hydrochlorothiazide (ZESTORETIC) 20-12.5 MG tablet TAKE 1 TABLET BY MOUTH EVERY DAY 12/21/20   Horald Pollen, MD  Multiple Vitamin (MULTIVITAMIN WITH MINERALS) TABS tablet Take 1 tablet by mouth daily.    [provider]  predniSONE (DELTASONE) 10 MG tablet Take 2 tablets (20 mg total) by mouth daily. 02/02/21   Francene Finders, PA-C    Family History Family History  Problem Relation Age of Onset  Hypertension Mother    Diabetes Mother    Hypertension Father    Diabetes Father     Social History Social History   Tobacco Use   Smoking status: Never   Smokeless tobacco: Never  Vaping Use   Vaping Use: Never used  Substance Use Topics   Alcohol use: No   Drug use: No     Allergies   Cefdinir, Penicillins, and Latex   Review of Systems Review of Systems  Constitutional:  Positive for chills and fever.  HENT:  Positive for congestion. Negative for ear pain and sore throat.   Eyes:  Negative for discharge and redness.  Respiratory:  Positive for cough and shortness  of breath. Negative for wheezing.   Gastrointestinal:  Negative for abdominal pain, diarrhea, nausea and vomiting.  Musculoskeletal:  Positive for myalgias.    Physical Exam Triage Vital Signs ED Triage Vitals  Enc Vitals Group     BP      Pulse      Resp      Temp      Temp src      SpO2      Weight      Height      Head Circumference      Peak Flow      Pain Score      Pain Loc      Pain Edu?      Excl. in [place]?    No data found.  Updated Vital Signs BP 123/84 (BP Location: Left Arm)    Pulse 99    Temp 98.9 F (37.2 C) (Oral)    Resp 18    SpO2 98%      Physical Exam Vitals and nursing note reviewed.  Constitutional:      General: She is not in acute distress.    Appearance: Normal appearance. She is not ill-appearing.  HENT:     Head: Normocephalic and atraumatic.     Nose: Congestion present.  Eyes:     Conjunctiva/sclera: Conjunctivae normal.  Cardiovascular:     Rate and Rhythm: Normal rate and regular rhythm.     Heart sounds: Normal heart sounds. No murmur heard. Pulmonary:     Effort: Pulmonary effort is normal. No respiratory distress.     Breath sounds: Normal breath sounds. No wheezing, rhonchi or rales.  Skin:    General: Skin is warm and dry.  Neurological:     Mental Status: She is alert.  Psychiatric:        Mood and Affect: Mood normal.        Thought Content: Thought content normal.     UC Treatments / Results  Labs (all labs ordered are listed, but only abnormal results are displayed) Labs Reviewed  POCT INFLUENZA A/B    EKG   Radiology No results found.  Procedures Procedures (including critical care time)  Medications Ordered in UC Medications - No data to display  Initial Impression / Assessment and Plan / UC Course  I have reviewed the triage vital signs and the nursing notes.  Pertinent labs & imaging results that were available during my care of the patient were reviewed by me and considered in my medical decision  making (see chart for details).    Flu test positive in office.  Will treat with Tamiflu and albuterol inhaler refilled.  Encouraged symptomatic treatment otherwise and follow-up with any further concerns.  Final Clinical Impressions(s) / UC Diagnoses   Final diagnoses:  Influenza A   Discharge Instructions   None    ED Prescriptions     Medication Sig Dispense Auth. Provider   oseltamivir (TAMIFLU) 75 MG capsule Take 1 capsule (75 mg total) by mouth every 12 (twelve) hours. 10 capsule Francene Finders, PA-C      PDMP not reviewed this encounter.   Francene Finders, PA-C 02/10/21 1003

## 2021-02-10 NOTE — ED Triage Notes (Signed)
Last seen on 1/17 for cough tx w/meds. Pt reports that she felt better until 3 days ago when her cough, congestion, feeling winded and sneezing returned. She reports an onset of fever last night. She also c/o pain with breathing on the left side of her stomach almost like gas and in her upper back. Tmax 103.5. Has been taking tylenol w/relief. Negative at home covid test. Needs albuterol refill.

## 2021-02-11 ENCOUNTER — Ambulatory Visit: Payer: No Typology Code available for payment source | Admitting: Nurse Practitioner

## 2021-02-23 ENCOUNTER — Ambulatory Visit (INDEPENDENT_AMBULATORY_CARE_PROVIDER_SITE_OTHER): Payer: No Typology Code available for payment source | Admitting: Neurology

## 2021-02-23 ENCOUNTER — Encounter: Payer: Self-pay | Admitting: Neurology

## 2021-02-23 VITALS — BP 124/85 | HR 77 | Ht 66.0 in | Wt 174.2 lb

## 2021-02-23 DIAGNOSIS — R519 Headache, unspecified: Secondary | ICD-10-CM | POA: Diagnosis not present

## 2021-02-23 DIAGNOSIS — E663 Overweight: Secondary | ICD-10-CM | POA: Diagnosis not present

## 2021-02-23 DIAGNOSIS — J302 Other seasonal allergic rhinitis: Secondary | ICD-10-CM

## 2021-02-23 DIAGNOSIS — R0681 Apnea, not elsewhere classified: Secondary | ICD-10-CM

## 2021-02-23 DIAGNOSIS — R0683 Snoring: Secondary | ICD-10-CM | POA: Diagnosis not present

## 2021-02-23 DIAGNOSIS — J4520 Mild intermittent asthma, uncomplicated: Secondary | ICD-10-CM

## 2021-02-23 NOTE — Patient Instructions (Signed)

## 2021-02-23 NOTE — Progress Notes (Signed)
Subjective:    Patient ID: Tracy [name] is a 46 y.o. female.  HPI    Star Age, MD, PhD [facility] [address] [place], [place] [address]  Dear Dr. Mitchel Honour,  I saw your patient, Tracy [name], upon your kind request in my sleep clinic today for initial consultation of her sleep disorder, in particular, concern for underlying obstructive sleep apnea.  Patient is unaccompanied today.  As you know, [name] is a 46 year old right-handed woman with an underlying medical history of hypertension, eczema, allergies, recurrent tonsillitis with status post tonsillectomy, recurrent sinusitis, asthma, pre-diabetes and overweight state, who reports snoring and recurrent headaches, as well as witnessed apneic spells, per her ex-husband.  I reviewed your office note from 10/26/20 and 10/24/2019.  Her Epworth sleepiness score is 5 out of 24, fatigue severity score is 19 out of 63.  She does not have a family history of sleep apnea but her father is being evaluated for it as I understand.  She has a strong family history of asthma.  She was diagnosed with asthma as an adult and has had trouble with recurrent sinusitis.  She had her tonsils out as a teenager because of recurrent tonsillitis.  She grew up with problems with eczema.  She is divorced, she lives with her 71 year old son.  She has a 53 year old son as well and a 45 year old daughter.  She goes to bed between 9:30 PM and 10 PM and rise time is at 6:45 AM.  She denies night to night nocturia but has occasionally woken up with a headache but attributes this to sinus problems and congestion.  Taking allergy medicine helps.  She hydrates well with water and drinks caffeine occasionally in the form of soda, not daily.  She drinks alcohol rarely on special occasion, maybe once every 3 months.  She is a non-smoker.  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  Her  Past Medical History Is Significant For: Past Medical History:  Diagnosis Date   Breast lump    Hypertension     Her Past Surgical History Is Significant For: Past Surgical History:  Procedure Laterality Date   BREAST LUMPECTOMY WITH RADIOACTIVE SEED LOCALIZATION Left 07/30/2020   Procedure: LEFT BREAST LUMPECTOMY WITH RADIOACTIVE SEED LOCALIZATION;  Surgeon: Erroll Luna, MD;  Location: [facility];  Service: General;  Laterality: Left;   [place]      Her Family History Is Significant For: Family History  Problem Relation Age of Onset   Hypertension Mother    Diabetes Mother    Hypertension Father    Diabetes Father    Sleep apnea Neg Hx     Her Social History Is Significant For: Social History   Socioeconomic History   Marital status: Divorced    Spouse name: Not on file   Number of children: Not on file   Years of education: Not on file   Highest education level: Not on file  Occupational History   Not on file  Tobacco Use   Smoking status: Never   Smokeless tobacco: Never  Vaping Use   Vaping Use: Never used  Substance and Sexual Activity   Alcohol use: Yes    Comment: occ   Drug use: No   Sexual activity: Yes    Birth control/protection: None  Other Topics Concern  Not on file  Social History Narrative   Not on file   Social Determinants of Health   Financial Resource Strain: Not on file  Food Insecurity: Not on file  Transportation Needs: Not on file  Physical Activity: Not on file  Stress: Not on file  Social Connections: Not on file    Her Allergies Are:  Allergies  Allergen Reactions   Cefdinir Itching   Penicillins Rash, Hives and Swelling    Has patient had a PCN reaction causing immediate rash, facial/tongue/throat swelling, SOB or lightheadedness with hypotension: No Has patient had a PCN reaction causing severe rash involving mucus membranes or skin necrosis:  No Has patient had a PCN reaction that required hospitalization NO Has patient had a PCN reaction occurring within the last 10 years: NO If all of the above answers are "NO", then may proceed with Cephalosporin use. Has patient had a PCN reaction causing immediate rash, facial/tongue/throat swelling, SOB or lightheadedness with hypotension: No Has patient had a PCN reaction causing severe rash involving mucus membranes or skin necrosis: No Has patient had a PCN reaction that required hospitalization NO Has patient had a PCN reaction occurring within the last 10 years: NO If all of the above answers are "NO", then may proceed with Cephalosporin use.    Latex Swelling, Rash and Itching  :   Her Current Medications Are:  Outpatient Encounter Medications as of 02/23/2021  Medication Sig   albuterol (VENTOLIN HFA) 108 (90 Base) MCG/ACT inhaler Inhale 1-2 puffs into the lungs every 6 (six) hours as needed for wheezing or shortness of breath.   amLODipine (NORVASC) 10 MG tablet TAKE 1 TABLET BY MOUTH EVERY DAY   cetirizine (ZYRTEC ALLERGY) 10 MG tablet Take 1 tablet (10 mg total) by mouth daily.   diphenhydrAMINE (BENADRYL) 25 MG tablet Take 25 mg by mouth every 6 (six) hours as needed.   doxycycline (VIBRAMYCIN) 100 MG capsule Take 1 capsule (100 mg total) by mouth 2 (two) times daily.   DULoxetine (CYMBALTA) 30 MG capsule TAKE 1 CAPSULE EVERY DAY   fexofenadine (ALLEGRA) 180 MG tablet Take 180 mg by mouth daily.   HYDROcodone-acetaminophen (NORCO/VICODIN) 5-325 MG tablet Take 1 tablet by mouth every 6 (six) hours as needed for moderate pain.   ibuprofen (ADVIL) 800 MG tablet Take 1 tablet (800 mg total) by mouth every 8 (eight) hours as needed.   lisinopril-hydrochlorothiazide (ZESTORETIC) 20-12.5 MG tablet TAKE 1 TABLET BY MOUTH EVERY DAY   Multiple Vitamin (MULTIVITAMIN WITH MINERALS) TABS tablet Take 1 tablet by mouth daily.   oseltamivir (TAMIFLU) 75 MG capsule Take 1 capsule (75 mg total) by  mouth every 12 (twelve) hours.   predniSONE (DELTASONE) 10 MG tablet Take 2 tablets (20 mg total) by mouth daily.   No facility-administered encounter medications on file as of 02/23/2021.  :   Review of Systems:  Out of a complete 14 point review of systems, all are reviewed and negative with the exception of these symptoms as listed below:   Review of Systems  Neurological:        Pt is here for sleep consult . Pt states she has hypertension , at times fatigue throughout the day , Pt states she does have headaches at times but she thinks its her sinues. Patient denies snoring,sleep study, no CPAP at home   FSS:19 ESS:5    Objective:  Neurological Exam  Physical Exam Physical Examination:   Vitals:   02/23/21 1116  BP: 124/85  Pulse: 77    General Examination: The patient is a very pleasant 46 y.o. female in no acute distress. She appears well-developed and well-nourished and well groomed.   HEENT: Normocephalic, atraumatic, pupils are equal, round and reactive to light, extraocular tracking is good without limitation to gaze excursion or nystagmus noted. Hearing is grossly intact. Face is symmetric with normal facial animation. Speech is clear with no dysarthria noted. There is no hypophonia. There is no lip, neck/head, jaw or voice tremor. Neck is supple with full range of passive and active motion. There are no carotid bruits on auscultation. Oropharynx exam reveals: mild mouth dryness, good dental hygiene and moderate airway crowding, due to small airway entry.  Slightly wider uvula noted, slightly wider tongue noted, Mallampati class III.  Neck circumference of 15-1/4 inches.  She has a minimal to mild overbite.  Tongue protrudes centrally and palate elevates symmetrically.  Chest: Clear to auscultation without wheezing, rhonchi or crackles noted.  Heart: S1+S2+0, regular and normal without murmurs, rubs or gallops noted.   Abdomen: Soft, non-tender and  non-distended.  Extremities: There is no obvious edema in the distal lower extremities bilaterally.   Skin: Warm and dry without trophic changes noted.   Musculoskeletal: exam reveals no obvious joint deformities.   Neurologically:  Mental status: The patient is awake, alert and oriented in all 4 spheres. Her immediate and remote memory, attention, language skills and fund of knowledge are appropriate. There is no evidence of aphasia, agnosia, apraxia or anomia. Speech is clear with normal prosody and enunciation. Thought process is linear. Mood is normal and affect is normal.  Cranial nerves II - XII are as described above under HEENT exam.  Motor exam: Normal bulk, strength and tone is noted. There is no tremor. Fine motor skills and coordination: grossly intact.  Cerebellar testing: No dysmetria or intention tremor. There is no truncal or gait ataxia.  Sensory exam: intact to light touch in the upper and lower extremities.  Gait, station and balance: She stands easily. No veering to one side is noted. No leaning to one side is noted. Posture is age-appropriate and stance is narrow based. Gait shows normal stride length and normal pace. No problems turning are noted.   Assessment and Plan:   In summary, Karlen A Diss is a very pleasant 46 y.o.-year old female with an underlying medical history of hypertension, eczema, allergies, recurrent tonsillitis with status post tonsillectomy, recurrent sinusitis, asthma, pre-diabetes and overweight state, whose history and physical exam are concerning for obstructive sleep apnea (OSA). I had a long chat with the patient about my findings and the diagnosis of OSA, its prognosis and treatment options. We talked about medical treatments, surgical interventions and non-pharmacological approaches. I explained in particular the risks and ramifications of untreated moderate to severe OSA, especially with respect to developing cardiovascular disease down the  Road, including congestive heart failure, difficult to treat hypertension, cardiac arrhythmias, or stroke. Even type 2 diabetes has, in part, been linked to untreated OSA. Symptoms of untreated OSA include daytime sleepiness, memory problems, mood irritability and mood disorder such as depression and anxiety, lack of energy, as well as recurrent headaches, especially morning headaches. We talked about trying to maintain a healthy lifestyle in general, as well as the importance of weight control. We also talked about the importance of good sleep hygiene. I recommended the following at this time: sleep study.  I outlined the differences between a laboratory attended sleep study versus home sleep test. I  explained the sleep test procedure to the patient and also outlined possible surgical and non-surgical treatment options of OSA, including the use of a custom-made dental device (which would require a referral to a specialist dentist or oral surgeon), upper airway surgical options, such as traditional UPPP or a novel less invasive surgical option in the form of Inspire hypoglossal nerve stimulation (which would involve a referral to an ENT surgeon). I also explained the CPAP treatment option to the patient, who indicated that she would be willing to try CPAP if the need arises.  We will pick up our discussion after testing, we will keep her posted as to her test results by phone call and plan to follow-up in the sleep clinic accordingly.  I answered all her questions today and she was in agreement with our plan.  Thank you very much for allowing me to participate in the care of this nice patient. If I can be of any further assistance to you please do not hesitate to call me at [phone].  Sincerely,   Star Age, MD, PhD

## 2021-03-07 ENCOUNTER — Other Ambulatory Visit: Payer: Self-pay

## 2021-03-07 ENCOUNTER — Encounter: Payer: Self-pay | Admitting: Emergency Medicine

## 2021-03-07 ENCOUNTER — Ambulatory Visit
Admission: EM | Admit: 2021-03-07 | Discharge: 2021-03-07 | Disposition: A | Discharge status: Home / Self Care | Payer: No Typology Code available for payment source | Attending: Physician Assistant | Admitting: Physician Assistant

## 2021-03-07 DIAGNOSIS — Z23 Encounter for immunization: Secondary | ICD-10-CM | POA: Diagnosis not present

## 2021-03-07 DIAGNOSIS — S61412A Laceration without foreign body of left hand, initial encounter: Secondary | ICD-10-CM

## 2021-03-07 MED ORDER — TETANUS-DIPHTH-ACELL PERTUSSIS 5-2.5-18.5 LF-MCG/0.5 IM SUSY
0.5000 mL | PREFILLED_SYRINGE | Freq: Once | INTRAMUSCULAR | Status: AC
  Administered 2021-03-07: 0.5 mL via INTRAMUSCULAR

## 2021-03-07 NOTE — ED Provider Notes (Signed)
[facility]    CSN: [id number] Arrival date & time: 03/07/21  1421      History   Chief Complaint Chief Complaint  Patient presents with   Laceration    HPI Tracy [name] is a 46 y.o. female.   Patient here today for evaluation of small laceration to her left third MCP that occurred today.  She reports that she was reaching into her coin jar which had recently been broken and in doing so accidentally cut her hand on a piece of glass.  She states her last tetanus was probably about 10 years ago.  She denies any numbness or tingling.  Bleeding is currently controlled.  Patient states she did rinse wound thoroughly under cold water for about 10 to 15 minutes.  The history is provided by the patient.  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      Home Medications    Prior to Admission medications   Medication Sig Start Date End Date Taking? Authorizing Provider  albuterol (VENTOLIN HFA) 108 (90 Base) MCG/ACT inhaler Inhale 1-2 puffs into the lungs every 6 (six) hours as needed for wheezing or shortness of breath. 02/10/21   Francene Finders, PA-C  amLODipine (NORVASC) 10 MG tablet TAKE 1 TABLET BY MOUTH EVERY DAY 01/12/21   Horald Pollen, MD  cetirizine (ZYRTEC ALLERGY) 10 MG tablet Take 1 tablet (10 mg  total) by mouth daily. 07/24/19   Hall-Potvin, [place], PA-C  diphenhydrAMINE (BENADRYL) 25 MG tablet Take 25 mg by mouth every 6 (six) hours as needed.    [provider]  doxycycline (VIBRAMYCIN) 100 MG capsule Take 1 capsule (100 mg total) by mouth 2 (two) times daily. 02/02/21   Francene Finders, PA-C  DULoxetine (CYMBALTA) 30 MG capsule TAKE 1 CAPSULE EVERY DAY 01/22/20   Horald Pollen, MD  fexofenadine (ALLEGRA) 180 MG tablet Take 180 mg by mouth daily.    [provider]  HYDROcodone-acetaminophen (NORCO/VICODIN) 5-325 MG tablet Take 1 tablet by mouth every 6 (six) hours as needed for moderate pain. 07/30/20   Cornett, Marcello Moores, MD  ibuprofen (ADVIL) 800 MG tablet Take 1 tablet (800 mg total) by mouth every 8 (eight) hours as needed. 07/30/20   Erroll Luna, MD  lisinopril-hydrochlorothiazide (ZESTORETIC) 20-12.5 MG tablet TAKE 1 TABLET BY MOUTH EVERY DAY 12/21/20   Horald Pollen, MD  Multiple Vitamin (MULTIVITAMIN WITH MINERALS) TABS tablet Take 1 tablet by mouth daily.    [provider]  oseltamivir (TAMIFLU) 75 MG capsule Take 1 capsule (75 mg total) by mouth every 12 (twelve) hours. 02/10/21   Francene Finders, PA-C  predniSONE (DELTASONE) 10 MG tablet Take 2 tablets (20 mg total) by mouth daily. 02/02/21   Francene Finders, PA-C    Family History  Family History  Problem Relation Age of Onset   Hypertension Mother    Diabetes Mother    Hypertension Father    Diabetes Father    Sleep apnea Neg Hx     Social History Social History   Tobacco Use   Smoking status: Never   Smokeless tobacco: Never  Vaping Use   Vaping Use: Never used  Substance Use Topics   Alcohol use: Yes    Comment: occ   Drug use: No     Allergies   Cefdinir, Penicillins, and Latex   Review of Systems Review of Systems  Constitutional:  Negative for chills and fever.  Eyes:  Negative for discharge and redness.  Gastrointestinal:  Negative for abdominal pain,  nausea and vomiting.  Skin:  Positive for wound. Negative for color change.    Physical Exam Triage Vital Signs ED Triage Vitals  Enc Vitals Group     BP 03/07/21 1512 (!) 142/95     Pulse Rate 03/07/21 1512 83     Resp 03/07/21 1512 18     Temp 03/07/21 1512 98.6 F (37 C)     Temp Source 03/07/21 1512 Oral     SpO2 03/07/21 1512 98 %     Weight --      Height --      Head Circumference --      Peak Flow --      Pain Score 03/07/21 1513 5     Pain Loc --      Pain Edu? --      Excl. in [place]? --    No data found.  Updated Vital Signs BP (!) 142/95 (BP Location: Right Arm)    Pulse 83    Temp 98.6 F (37 C) (Oral)    Resp 18    SpO2 98%      Physical Exam Vitals and nursing note reviewed.  Constitutional:      General: She is not in acute distress.    Appearance: Normal appearance. She is not ill-appearing.  HENT:     Head: Normocephalic and atraumatic.  Eyes:     Conjunctiva/sclera: Conjunctivae normal.  Cardiovascular:     Rate and Rhythm: Normal rate.  Pulmonary:     Effort: Pulmonary effort is normal.  Skin:    Comments: ~ 1 cm laceration to left 3rd MCP without active bleeding  Neurological:     Mental Status: She is alert.  Psychiatric:        Mood and Affect: Mood normal.        Behavior: Behavior normal.        Thought Content: Thought content normal.     UC Treatments / Results  Labs (all labs ordered are listed, but only abnormal results are displayed) Labs Reviewed - No data to display  EKG   Radiology No results found.  Procedures Procedures (including critical care time)  Medications Ordered in UC Medications  Tdap (BOOSTRIX) injection 0.5 mL (0.5 mLs Intramuscular Given 03/07/21 1541)    Initial Impression / Assessment and Plan / UC Course  I have reviewed the triage vital signs and the nursing notes.  Pertinent labs & imaging results that were available during my care of the patient were reviewed by me and considered in my  medical decision making (see chart for details).    Dermabond used in office to keep wound sealed. Tdap administered in office. Encouraged follow up with any signs of infection or other concerns.   Final  Clinical Impressions(s) / UC Diagnoses   Final diagnoses:  Laceration of left hand without foreign body, initial encounter   Discharge Instructions   None    ED Prescriptions   None    PDMP not reviewed this encounter.   Francene Finders, PA-C 03/07/21 1547

## 2021-03-07 NOTE — ED Triage Notes (Signed)
Pt here with small laceration to left third knuckle from glass; bleeding controlled

## 2021-03-27 ENCOUNTER — Other Ambulatory Visit: Payer: Self-pay | Admitting: Emergency Medicine

## 2021-03-27 DIAGNOSIS — I10 Essential (primary) hypertension: Secondary | ICD-10-CM

## 2021-04-27 ENCOUNTER — Encounter (HOSPITAL_COMMUNITY): Payer: Self-pay

## 2021-06-28 ENCOUNTER — Telehealth: Payer: Self-pay

## 2021-06-28 NOTE — Telephone Encounter (Signed)
Called patient to inform her that her medication was sent to her pharmacy for 90 tabs and 2 refills back in Dec 2022. Patient to call pharmacy to request a refill.

## 2021-06-28 NOTE — Telephone Encounter (Signed)
Pt is requesting a refill on: amLODipine (NORVASC) 10 MG tablet  Pharmacy: [id number] [address]  LOV 10/26/20 ROV  10/27/21

## 2021-08-27 ENCOUNTER — Ambulatory Visit
Admission: EM | Admit: 2021-08-27 | Discharge: 2021-08-27 | Disposition: A | Discharge status: Home / Self Care | Payer: No Typology Code available for payment source | Attending: Emergency Medicine | Admitting: Emergency Medicine

## 2021-08-27 DIAGNOSIS — J309 Allergic rhinitis, unspecified: Secondary | ICD-10-CM

## 2021-08-27 DIAGNOSIS — H65116 Acute and subacute allergic otitis media (mucoid) (sanguinous) (serous), recurrent, bilateral: Secondary | ICD-10-CM

## 2021-08-27 DIAGNOSIS — R0982 Postnasal drip: Secondary | ICD-10-CM | POA: Diagnosis not present

## 2021-08-27 DIAGNOSIS — J302 Other seasonal allergic rhinitis: Secondary | ICD-10-CM | POA: Diagnosis not present

## 2021-08-27 MED ORDER — MONTELUKAST SODIUM 10 MG PO TABS: 10.0000 mg | ORAL_TABLET | Freq: Every day | ORAL | 1 refills | Status: DC

## 2021-08-27 MED ORDER — METHYLPREDNISOLONE 4 MG PO TBPK: ORAL_TABLET | ORAL | 0 refills | Status: DC

## 2021-08-27 MED ORDER — FEXOFENADINE HCL 180 MG PO TABS: 180.0000 mg | ORAL_TABLET | Freq: Every day | ORAL | 1 refills | Status: DC

## 2021-08-27 MED ORDER — FLUTICASONE PROPIONATE 50 MCG/ACT NA SUSP: 1.0000 | Freq: Every day | NASAL | 1 refills | Status: DC

## 2021-08-27 NOTE — Discharge Instructions (Addendum)
Your symptoms and my physical exam findings are concerning for exacerbation of your underlying allergies.    To avoid catching frequent respiratory infections, having skin reactions, dealing with eye irritation, losing sleep, missing work, etc., due to uncontrolled allergies, it is important that you begin/continue your allergy regimen and are consistent with taking your meds exactly as prescribed.   Please see the list below for recommended medications, dosages and frequencies to provide relief of current symptoms:     Medrol Dosepak (methylprednisolone): This is a steroid that will significantly calm your upper and lower airways, please take one row of tablets daily with your breakfast meal starting tomorrow morning.  In my opinion you will not need to take a full 6-day course that you can discontinue this medication as soon as you are feeling significantly better.  Allegra (fexofenadine): This is an excellent second-generation antihistamine that helps to reduce respiratory inflammatory response to environmental allergens.  This medication is not known to cause daytime sleepiness so it can be taken in the daytime.  If you find that it does make you sleepy, please feel free to take it at bedtime.   Singulair (montelukast): This is a mast cell stabilizer that works well with antihistamines.  Mast cells are responsible for stimulating histamine production so you can imagine that if we can reduce the activity of your mast cells, then fewer histamines will be produced and inflammation caused by allergy exposure will be significantly reduced.  I recommend that you take this medication at the same time you take your antihistamine.  If you would like to continue using this daily into the fall, I believe that will be helpful in preventing you from acquiring allergic bronchitis as you usually do every year.   Flonase (fluticasone): This is a steroid nasal spray that you use once daily, 1 spray in each nare.   This medication does not work well if you decide to use it only used as you feel you need to, it works best used on a daily basis.  After 3 to 5 days of use, you will notice significant reduction of the inflammation and mucus production that is currently being caused by exposure to allergens, whether seasonal or environmental.  The most common side effect of this medication is nosebleeds.  If you experience a nosebleed, please discontinue use for 1 week, then feel free to resume.  I have provided you with a prescription.     ProAir, Ventolin, Proventil (albuterol): This inhaled medication contains a short acting beta agonist bronchodilator.  This medication works on the smooth muscle that opens and constricts of your airways by relaxing the muscle.  The result of relaxation of the smooth muscle is increased air movement and improved work of breathing.  This is a short acting medication that can be used every 4-6 hours as needed for increased work of breathing, shortness of breath, wheezing and excessive coughing.  At this time, I do not believe that you absolutely need to use it however you may find that a few puffs daily will improve your work of breathing and will help you stay ahead of getting bronchitis this year.    If your insurance will not cover your allergy medications, please consider downloading the Good Rx app which is free.  You can find considerable discounts on prescription and over-the-counter medications.  The allergy specialist that I mentioned is:  Either Dr. Oris Drone or Dr. Pablo Lawrence  [facility] [address], [place] [id number]   If you find that you have not had improvement of your symptoms in the next 5 to 7 days, please follow-up with your primary care provider or return here to urgent care for repeat evaluation and further recommendations.   Thank you for visiting urgent care today.  We appreciate the  opportunity to participate in your care.

## 2021-08-27 NOTE — ED Provider Notes (Addendum)
[facility]    CSN: [id number] Arrival date & time: 08/27/21  4315    HISTORY   Chief Complaint  Patient presents with   Otalgia   HPI Tracy [name] is a pleasant, 46 y.o. female who presents to urgent care today. Patient reports a history of allergies typically worse towards the end of the summer into the fall.  Patient states she has been sneezing and having right ear pain which is worse at night for the past 1 and half weeks.  Patient states has been taking Tylenol and Advil.  Patient states she takes Allegra every day despite her doctors advised not to do so.  Patient states she has gotten bronchitis every year in October for the past 3 years.  Patient states she is also used Flonase in the past but not currently using it at this time.  Patient denies fever, aches, chills, nausea, vomiting, diarrhea.  Patient states her blood pressure is elevated today because she has not yet taken her blood pressure medication.  The history is provided by the patient.   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    Home Medications    Prior to Admission medications   Medication Sig Start Date End Date Taking? Authorizing Provider  albuterol (VENTOLIN HFA) 108 (90 Base) MCG/ACT inhaler Inhale 1-2 puffs into the lungs every 6 (six) hours as needed for wheezing or shortness of breath.  02/10/21   Francene Finders, PA-C  amLODipine (NORVASC) 10 MG tablet TAKE 1 TABLET BY MOUTH EVERY DAY 01/12/21   Horald Pollen, MD  cetirizine (ZYRTEC ALLERGY) 10 MG tablet Take 1 tablet (10 mg total) by mouth daily. 07/24/19   Hall-Potvin, [place], PA-C  diphenhydrAMINE (BENADRYL) 25 MG tablet Take 25 mg by mouth every 6 (six) hours as needed.    [provider]  doxycycline (VIBRAMYCIN) 100 MG capsule Take 1 capsule (100 mg total) by mouth 2 (two) times daily. 02/02/21   Francene Finders, PA-C  DULoxetine (CYMBALTA) 30 MG capsule TAKE 1 CAPSULE EVERY DAY 01/22/20   Horald Pollen, MD  fexofenadine (ALLEGRA) 180 MG tablet Take 180 mg by mouth daily.    [provider]  HYDROcodone-acetaminophen (NORCO/VICODIN) 5-325 MG tablet Take 1 tablet by mouth every 6 (six) hours as needed for moderate pain. 07/30/20   Cornett, Marcello Moores, MD  ibuprofen (ADVIL) 800 MG tablet Take 1 tablet (800 mg total) by mouth every 8 (eight) hours as needed. 07/30/20   Erroll Luna, MD  lisinopril-hydrochlorothiazide (ZESTORETIC) 20-12.5 MG tablet TAKE 1 TABLET BY MOUTH EVERY DAY 03/27/21   Horald Pollen, MD  Multiple Vitamin (MULTIVITAMIN WITH MINERALS) TABS tablet Take 1 tablet by mouth daily.    [provider]  oseltamivir (TAMIFLU) 75 MG capsule Take 1 capsule (75 mg total) by mouth every 12 (twelve) hours. 02/10/21  Francene Finders, PA-C  predniSONE (DELTASONE) 10 MG tablet Take 2 tablets (20 mg total) by mouth daily. 02/02/21   Francene Finders, PA-C    Family History Family History  Problem Relation Age of Onset   Hypertension Mother    Diabetes Mother    Hypertension Father    Diabetes Father    Sleep apnea Neg Hx    Social History Social History   Tobacco Use   Smoking status: Never   Smokeless tobacco: Never  Vaping Use   Vaping Use: Never used  Substance Use Topics   Alcohol use: Yes    Comment: occ   Drug use: No   Allergies   Cefdinir, Penicillins,  and Latex  Review of Systems Review of Systems Pertinent findings revealed after performing a 14 point review of systems has been noted in the history of present illness.  Physical Exam Triage Vital Signs ED Triage Vitals  Enc Vitals Group     BP 11/13/20 0827 (!) 147/82     Pulse Rate 11/13/20 0827 72     Resp 11/13/20 0827 18     Temp 11/13/20 0827 98.3 F (36.8 C)     Temp Source 11/13/20 0827 Oral     SpO2 11/13/20 0827 98 %     Weight --      Height --      Head Circumference --      Peak Flow --      Pain Score 11/13/20 0826 5     Pain Loc --      Pain Edu? --      Excl. in [place]? --   No data found.  Updated Vital Signs BP (!) 152/97 (BP Location: Right Arm)   Pulse 75   Temp 98.5 F (36.9 C) (Oral)   Resp 18   SpO2 97%   Physical Exam Vitals and nursing note reviewed.  Constitutional:      General: She is not in acute distress.    Appearance: Normal appearance. She is not ill-appearing.  HENT:     Head: Normocephalic and atraumatic.     Salivary Glands: Right salivary gland is not diffusely enlarged or tender. Left salivary gland is not diffusely enlarged or tender.     Right Ear: Ear canal and external ear normal. No drainage. A middle ear effusion is present. There is no impacted cerumen. Tympanic membrane is bulging. Tympanic membrane is not injected or erythematous.     Left Ear: Ear canal and external ear normal. No drainage. A middle ear effusion is present. There is no impacted cerumen. Tympanic membrane is bulging. Tympanic membrane is not injected or erythematous.     Ears:     Comments: Bilateral EACs normal, both TMs bulging with clear fluid    Nose: Rhinorrhea present. No nasal deformity, septal deviation, signs of injury, nasal tenderness, mucosal edema or congestion. Rhinorrhea is clear.     Right Nostril: Occlusion present. No foreign body, epistaxis or septal hematoma.     Left Nostril: Occlusion present. No foreign body, epistaxis or septal  hematoma.     Right Turbinates: Enlarged, swollen and pale.     Left Turbinates: Enlarged, swollen and pale.     Right Sinus: No maxillary sinus tenderness or frontal sinus tenderness.     Left Sinus: No maxillary sinus tenderness or frontal sinus tenderness.     Mouth/Throat:     Lips: Pink. No lesions.     Mouth: Mucous membranes are moist.  No oral lesions.     Pharynx: Oropharynx is clear. Uvula midline. No posterior oropharyngeal erythema or uvula swelling.     Tonsils: No tonsillar exudate. 0 on the right. 0 on the left.     Comments: Postnasal drip Eyes:     General: Lids are normal.        Right eye: No discharge.        Left eye: No discharge.     Extraocular Movements: Extraocular movements intact.     Conjunctiva/sclera: Conjunctivae normal.     Right eye: Right conjunctiva is not injected.     Left eye: Left conjunctiva is not injected.  Neck:     Trachea: Trachea and phonation normal.  Cardiovascular:     Rate and Rhythm: Normal rate and regular rhythm.     Pulses: Normal pulses.     Heart sounds: Normal heart sounds. No murmur heard.    No friction rub. No gallop.  Pulmonary:     Effort: Pulmonary effort is normal. No accessory muscle usage, prolonged expiration or respiratory distress.     Breath sounds: No stridor, decreased air movement or transmitted upper airway sounds. Examination of the right-upper field reveals decreased breath sounds. Examination of the left-upper field reveals decreased breath sounds. Examination of the right-middle field reveals decreased breath sounds. Examination of the left-middle field reveals decreased breath sounds. Examination of the right-lower field reveals decreased breath sounds. Examination of the left-lower field reveals decreased breath sounds. Decreased breath sounds present. No wheezing, rhonchi or rales.  Chest:     Chest wall: No tenderness.  Musculoskeletal:        General: Normal range of motion.     Cervical back: Normal  range of motion and neck supple. Normal range of motion.  Lymphadenopathy:     Cervical: No cervical adenopathy.  Skin:    General: Skin is warm and dry.     Findings: No erythema or rash.  Neurological:     General: No focal deficit present.     Mental Status: She is alert and oriented to person, place, and time.  Psychiatric:        Mood and Affect: Mood normal.        Behavior: Behavior normal.     Visual Acuity Right Eye Distance:   Left Eye Distance:   Bilateral Distance:    Right Eye Near:   Left Eye Near:    Bilateral Near:     UC Couse / Diagnostics / Procedures:     Radiology No results found.  Procedures Procedures (including critical care time) EKG  Pending results:  Labs Reviewed - No data to display  Medications Ordered in UC: Medications - No data to display  UC Diagnoses / Final Clinical Impressions(s)   I have reviewed the triage vital signs and the nursing notes.  Pertinent labs & imaging results that were available during my care of the patient were reviewed by me and considered in my medical decision making (see chart for details).    Final diagnoses:  Seasonal allergic rhinitis, unspecified trigger  Allergic rhinitis with postnasal drip  Recurrent acute allergic otitis media of both ears   Patient provided with renewals of Allegra and Flonase.  Because patient has decreased breath sounds on exam, recommend patient take Singulair as well.  Return precautions advised.  ED Prescriptions     Medication Sig Dispense Auth. Provider   fexofenadine (ALLEGRA) 180 MG tablet Take 1 tablet (180 mg total) by mouth  daily. 90 tablet Lynden Oxford Scales, PA-C   fluticasone (FLONASE) 50 MCG/ACT nasal spray Place 1 spray into both nostrils daily. 47.4 mL Lynden Oxford Scales, PA-C   methylPREDNISolone (MEDROL DOSEPAK) 4 MG TBPK tablet Take 24 mg on day 1, 20 mg on day 2, 16 mg on day 3, 12 mg on day 4, 8 mg on day 5, 4 mg on day 6.  Take all tablets in  each row at once, do not spread tablets out throughout the day. 21 tablet Lynden Oxford Scales, PA-C   montelukast (SINGULAIR) 10 MG tablet Take 1 tablet (10 mg total) by mouth at bedtime. 90 tablet Lynden Oxford Scales, [place]      PDMP not reviewed this encounter.  Disposition Upon Discharge:  Condition: stable for discharge home Home: take medications as prescribed; routine discharge instructions as discussed; follow up as advised.  Patient presented with an acute illness with associated systemic symptoms and significant discomfort requiring urgent management. In my opinion, this is a condition that a prudent lay person (someone who possesses an average knowledge of health and medicine) may potentially expect to result in complications if not addressed urgently such as respiratory distress, impairment of bodily function or dysfunction of bodily organs.   Routine symptom specific, illness specific and/or disease specific instructions were discussed with the patient and/or caregiver at length.   As such, the patient has been evaluated and assessed, work-up was performed and treatment was provided in alignment with urgent care protocols and evidence based medicine.  Patient/parent/caregiver has been advised that the patient may require follow up for further testing and treatment if the symptoms continue in spite of treatment, as clinically indicated and appropriate.  If the patient was tested for COVID-19, Influenza and/or RSV, then the patient/parent/guardian was advised to isolate at home pending the results of his/her diagnostic coronavirus test and potentially longer if they're positive. I have also advised pt that if his/her COVID-19 test returns positive, it's recommended to self-isolate for at least 10 days after symptoms first appeared AND until fever-free for 24 hours without fever reducer AND other symptoms have improved or resolved. Discussed self-isolation recommendations as well as  instructions for household member/close contacts as per the [facility] and [place] DHHS, and also gave patient the [place] packet with this information.  Patient/parent/caregiver has been advised to return to the [facility] or PCP in 3-5 days if no better; to PCP or the Emergency Department if new signs and symptoms develop, or if the current signs or symptoms continue to change or worsen for further workup, evaluation and treatment as clinically indicated and appropriate  The patient will follow up with their current PCP if and as advised. If the patient does not currently have a PCP we will assist them in obtaining one.   The patient may need specialty follow up if the symptoms continue, in spite of conservative treatment and management, for further workup, evaluation, consultation and treatment as clinically indicated and appropriate.  Patient/parent/caregiver verbalized understanding and agreement of plan as discussed.  All questions were addressed during visit.  Please see discharge instructions below for further details of plan.  Discharge Instructions:   Discharge Instructions      Your symptoms and my physical exam findings are concerning for exacerbation of your underlying allergies.    To avoid catching frequent respiratory infections, having skin reactions, dealing with eye irritation, losing sleep, missing work, etc., due to uncontrolled allergies, it is important that you begin/continue your allergy regimen and are  consistent with taking your meds exactly as prescribed.   Please see the list below for recommended medications, dosages and frequencies to provide relief of current symptoms:     Medrol Dosepak (methylprednisolone): This is a steroid that will significantly calm your upper and lower airways, please take one row of tablets daily with your breakfast meal starting tomorrow morning.  In my opinion you will not need to take a full 6-day course that you can discontinue this medication as soon as  you are feeling significantly better.  Allegra (fexofenadine): This is an excellent second-generation antihistamine that helps to reduce respiratory inflammatory response to environmental allergens.  This medication is not known to cause daytime sleepiness so it can be taken in the daytime.  If you find that it does make you sleepy, please feel free to take it at bedtime.   Singulair (montelukast): This is a mast cell stabilizer that works well with antihistamines.  Mast cells are responsible for stimulating histamine production so you can imagine that if we can reduce the activity of your mast cells, then fewer histamines will be produced and inflammation caused by allergy exposure will be significantly reduced.  I recommend that you take this medication at the same time you take your antihistamine.  If you would like to continue using this daily into the fall, I believe that will be helpful in preventing you from acquiring allergic bronchitis as you usually do every year.   Flonase (fluticasone): This is a steroid nasal spray that you use once daily, 1 spray in each nare.  This medication does not work well if you decide to use it only used as you feel you need to, it works best used on a daily basis.  After 3 to 5 days of use, you will notice significant reduction of the inflammation and mucus production that is currently being caused by exposure to allergens, whether seasonal or environmental.  The most common side effect of this medication is nosebleeds.  If you experience a nosebleed, please discontinue use for 1 week, then feel free to resume.  I have provided you with a prescription.     ProAir, Ventolin, Proventil (albuterol): This inhaled medication contains a short acting beta agonist bronchodilator.  This medication works on the smooth muscle that opens and constricts of your airways by relaxing the muscle.  The result of relaxation of the smooth muscle is increased air movement and improved work  of breathing.  This is a short acting medication that can be used every 4-6 hours as needed for increased work of breathing, shortness of breath, wheezing and excessive coughing.  At this time, I do not believe that you absolutely need to use it however you may find that a few puffs daily will improve your work of breathing and will help you stay ahead of getting bronchitis this year.    If your insurance will not cover your allergy medications, please consider downloading the [employer] app which is free.  You can find considerable discounts on prescription and over-the-counter medications.   If you find that you have not had improvement of your symptoms in the next 5 to 7 days, please follow-up with your primary care provider or return here to urgent care for repeat evaluation and further recommendations.   Thank you for visiting urgent care today.  We appreciate the opportunity to participate in your care.       This office note has been dictated using Museum/gallery curator.  Unfortunately, this method of dictation can sometimes lead to typographical or grammatical errors.  I apologize for your inconvenience in advance if this occurs.  Please do not hesitate to reach out to me if clarification is needed.      Lynden Oxford Scales, PA-C 08/27/21 1136    Lynden Oxford Scales, PA-C 08/27/21 1138

## 2021-08-27 NOTE — ED Triage Notes (Signed)
The patient c/o sneezing, right ear pain (worse at night).  Started: about a 1.5 weeks ago  Home interventions: tylenol, advil

## 2021-10-07 ENCOUNTER — Ambulatory Visit
Admission: RE | Admit: 2021-10-07 | Discharge: 2021-10-07 | Disposition: A | Discharge status: Home / Self Care | Payer: No Typology Code available for payment source | Source: Ambulatory Visit | Attending: Urgent Care | Admitting: Urgent Care

## 2021-10-07 VITALS — BP 134/95 | HR 73 | Temp 98.2°F | Resp 18

## 2021-10-07 DIAGNOSIS — R051 Acute cough: Secondary | ICD-10-CM

## 2021-10-07 DIAGNOSIS — J0191 Acute recurrent sinusitis, unspecified: Secondary | ICD-10-CM | POA: Diagnosis not present

## 2021-10-07 DIAGNOSIS — R0602 Shortness of breath: Secondary | ICD-10-CM

## 2021-10-07 DIAGNOSIS — R0981 Nasal congestion: Secondary | ICD-10-CM

## 2021-10-07 DIAGNOSIS — J309 Allergic rhinitis, unspecified: Secondary | ICD-10-CM | POA: Diagnosis not present

## 2021-10-07 DIAGNOSIS — H9201 Otalgia, right ear: Secondary | ICD-10-CM

## 2021-10-07 MED ORDER — PREDNISONE 20 MG PO TABS: ORAL_TABLET | ORAL | 0 refills | Status: DC

## 2021-10-07 MED ORDER — DOXYCYCLINE HYCLATE 100 MG PO CAPS: 100.0000 mg | ORAL_CAPSULE | Freq: Two times a day (BID) | ORAL | 0 refills | Status: DC

## 2021-10-07 MED ORDER — LEVOCETIRIZINE DIHYDROCHLORIDE 5 MG PO TABS: 5.0000 mg | ORAL_TABLET | Freq: Every evening | ORAL | 1 refills | Status: DC

## 2021-10-07 MED ORDER — BENZONATATE 100 MG PO CAPS: 100.0000 mg | ORAL_CAPSULE | Freq: Three times a day (TID) | ORAL | 0 refills | Status: DC | PRN

## 2021-10-07 NOTE — ED Triage Notes (Signed)
Patient complains of bilateral ear pain, right ear worse x 1 week. Treating with nasal spray, allegra, robitussin, Tylenol, and Advil. States she has a hx of allergies and was here in august for same issue.

## 2021-10-07 NOTE — ED Provider Notes (Signed)
[name]  Note:  This document was prepared using Systems analyst and may include unintentional dictation errors.  MRN: [id number] DOB: 1975/09/13  Subjective:   Tracy [name] is a 46 y.o. female presenting for 1 week history of recurrent sinus pressure, sinus congestion, right-sided ear pain and to a lesser degree left-sided intermittent ear pain, right-sided throat pain, coughing.  No chest pain, shortness of breath or wheezing.  Patient has a history of difficult to control allergic rhinitis.  She has been using Allegra and Flonase consistently more than a year.  She was last seen for similar symptoms about a month ago.  She underwent a course of methylprednisolone.  Her symptoms improved but never fully resolved.  No current facility-administered medications for this encounter.  Current Outpatient Medications:    amLODipine (NORVASC) 10 MG tablet, TAKE 1 TABLET BY MOUTH EVERY DAY, Disp: 90 tablet, Rfl: 2   DULoxetine (CYMBALTA) 30 MG capsule, TAKE 1 CAPSULE EVERY DAY, Disp: 90 capsule, Rfl: 0   fexofenadine (ALLEGRA) 180 MG tablet, Take 1 tablet (180 mg total) by mouth daily., Disp: 90 tablet, Rfl: 1   fluticasone (FLONASE) 50 MCG/ACT nasal spray, Place 1 spray into both nostrils daily., Disp: 47.4 mL, Rfl: 1   lisinopril-hydrochlorothiazide (ZESTORETIC) 20-12.5 MG tablet, TAKE 1 TABLET BY MOUTH EVERY DAY, Disp: 90 tablet, Rfl: 0   methylPREDNISolone (MEDROL DOSEPAK) 4 MG TBPK tablet, Take 24 mg on day 1, 20 mg on day 2, 16 mg on day 3, 12 mg on day 4, 8 mg on day 5, 4 mg on day 6.  Take all tablets in each row at once, do not spread tablets out throughout the day., Disp: 21 tablet, Rfl: 0   montelukast (SINGULAIR) 10 MG tablet, Take 1 tablet (10 mg total) by mouth at bedtime., Disp: 90 tablet, Rfl: 1   Multiple Vitamin (MULTIVITAMIN WITH MINERALS) TABS tablet, Take 1 tablet by mouth daily., Disp: , Rfl:    Allergies  Allergen Reactions    Cefdinir Itching   Penicillins Rash, Hives and Swelling    Has patient had a PCN reaction causing immediate rash, facial/tongue/throat swelling, SOB or lightheadedness with hypotension: No Has patient had a PCN reaction causing severe rash involving mucus membranes or skin necrosis: No Has patient had a PCN reaction that required hospitalization NO Has patient had a PCN reaction occurring within the last 10 years: NO If all of the above answers are "NO", then may proceed with Cephalosporin use. Has patient had a PCN reaction causing immediate rash, facial/tongue/throat swelling, SOB or lightheadedness with hypotension: No Has patient had a PCN reaction causing severe rash involving mucus membranes or skin necrosis: No Has patient had a PCN reaction that required hospitalization NO Has patient had a PCN reaction occurring within the last 10 years: NO If all of the above answers are "NO", then may proceed with Cephalosporin use.    Latex Swelling, Rash and Itching    Past Medical History:  Diagnosis Date   Breast lump    Hypertension      Past Surgical History:  Procedure Laterality Date   BREAST LUMPECTOMY WITH RADIOACTIVE SEED LOCALIZATION Left 07/30/2020   Procedure: LEFT BREAST LUMPECTOMY WITH RADIOACTIVE SEED LOCALIZATION;  Surgeon: Erroll Luna, MD;  Location: [facility];  Service: General;  Laterality: Left;   CESAREAN SECTION     TONSILLECTOMY     UTERINE FIBROID SURGERY      Family History  Problem  Relation Age of Onset   Hypertension Mother    Diabetes Mother    Hypertension Father    Diabetes Father    Sleep apnea Neg Hx     Social History   Tobacco Use   Smoking status: Never   Smokeless tobacco: Never  Vaping Use   Vaping Use: Never used  Substance Use Topics   Alcohol use: Yes    Comment: occ   Drug use: No    ROS   Objective:   Vitals: BP (!) 134/95 (BP Location: Right Arm)   Pulse 73   Temp 98.2 F (36.8 C) (Oral)   Resp 18    SpO2 97%   Physical Exam Constitutional:      General: She is not in acute distress.    Appearance: Normal appearance. She is well-developed and normal weight. She is not ill-appearing, toxic-appearing or diaphoretic.  HENT:     Head: Normocephalic and atraumatic.     Right Ear: Tympanic membrane, ear canal and external ear normal. No drainage or tenderness. No middle ear effusion. There is no impacted cerumen. Tympanic membrane is not erythematous.     Left Ear: Tympanic membrane, ear canal and external ear normal. No drainage or tenderness.  No middle ear effusion. There is no impacted cerumen. Tympanic membrane is not erythematous.     Nose: Congestion present. No rhinorrhea.     Mouth/Throat:     Mouth: Mucous membranes are moist. No oral lesions.     Pharynx: No pharyngeal swelling, oropharyngeal exudate, posterior oropharyngeal erythema or uvula swelling.     Tonsils: No tonsillar exudate or tonsillar abscesses.  Eyes:     General: No scleral icterus.       Right eye: No discharge.        Left eye: No discharge.     Extraocular Movements: Extraocular movements intact.     Right eye: Normal extraocular motion.     Left eye: Normal extraocular motion.     Conjunctiva/sclera: Conjunctivae normal.  Cardiovascular:     Rate and Rhythm: Normal rate and regular rhythm.     Heart sounds: Normal heart sounds. No murmur heard.    No friction rub. No gallop.  Pulmonary:     Effort: Pulmonary effort is normal. No respiratory distress.     Breath sounds: No stridor. No wheezing, rhonchi or rales.  Chest:     Chest wall: No tenderness.  Musculoskeletal:     Cervical back: Normal range of motion and neck supple.  Lymphadenopathy:     Cervical: No cervical adenopathy.  Skin:    General: Skin is warm and dry.  Neurological:     General: No focal deficit present.     Mental Status: She is alert and oriented to person, place, and time.  Psychiatric:        Mood and Affect: Mood  normal.        Behavior: Behavior normal.     Assessment and Plan :   PDMP not reviewed this encounter.  1. Acute recurrent sinusitis, unspecified location   2. Acute otalgia, right   3. Sinus congestion   4. Acute cough   5. Allergic rhinitis, unspecified seasonality, unspecified trigger   6. Shortness of breath    Patient has recurrent allergic rhinitis and therefore will use an oral prednisone course.  Hold Flonase.  Switch from [place] to Xyzal.  I suspect a secondary sinusitis and therefore will use doxycycline for this.  Use supportive care otherwise.  Restart Flonase in about 2 weeks.  Deferred imaging given clear cardiopulmonary exam, hemodynamically stable vital signs. Counseled patient on potential for adverse effects with medications prescribed/recommended today, ER and return-to-clinic precautions discussed, patient verbalized understanding.    Jaynee Eagles, [place] 10/07/21 [phone]

## 2021-10-27 ENCOUNTER — Encounter: Payer: Self-pay | Admitting: Emergency Medicine

## 2021-10-27 ENCOUNTER — Ambulatory Visit (INDEPENDENT_AMBULATORY_CARE_PROVIDER_SITE_OTHER): Payer: No Typology Code available for payment source | Admitting: Emergency Medicine

## 2021-10-27 VITALS — BP 134/72 | HR 77 | Temp 97.8°F | Ht 66.0 in | Wt 171.1 lb

## 2021-10-27 DIAGNOSIS — R7303 Prediabetes: Secondary | ICD-10-CM

## 2021-10-27 DIAGNOSIS — M25511 Pain in right shoulder: Secondary | ICD-10-CM

## 2021-10-27 DIAGNOSIS — Z13220 Encounter for screening for lipoid disorders: Secondary | ICD-10-CM

## 2021-10-27 DIAGNOSIS — Z1211 Encounter for screening for malignant neoplasm of colon: Secondary | ICD-10-CM

## 2021-10-27 DIAGNOSIS — Z1329 Encounter for screening for other suspected endocrine disorder: Secondary | ICD-10-CM

## 2021-10-27 DIAGNOSIS — Z0000 Encounter for general adult medical examination without abnormal findings: Secondary | ICD-10-CM

## 2021-10-27 DIAGNOSIS — Z1159 Encounter for screening for other viral diseases: Secondary | ICD-10-CM

## 2021-10-27 DIAGNOSIS — I10 Essential (primary) hypertension: Secondary | ICD-10-CM

## 2021-10-27 DIAGNOSIS — Z130 Encounter for screening for diseases of the blood and blood-forming organs and certain disorders involving the immune mechanism: Secondary | ICD-10-CM

## 2021-10-27 DIAGNOSIS — G8929 Other chronic pain: Secondary | ICD-10-CM

## 2021-10-27 DIAGNOSIS — Z13228 Encounter for screening for other metabolic disorders: Secondary | ICD-10-CM

## 2021-10-27 LAB — CBC WITH DIFFERENTIAL/PLATELET
Basophils Absolute: 0 10*3/uL (ref 0.0–0.1)
Basophils Relative: 0.6 % (ref 0.0–3.0)
Eosinophils Absolute: 0.1 10*3/uL (ref 0.0–0.7)
Eosinophils Relative: 1.6 % (ref 0.0–5.0)
HCT: 39.1 % (ref 36.0–46.0)
Hemoglobin: 12.9 g/dL (ref 12.0–15.0)
Lymphocytes Relative: 30.6 % (ref 12.0–46.0)
Lymphs Abs: 2 10*3/uL (ref 0.7–4.0)
MCHC: 32.9 g/dL (ref 30.0–36.0)
MCV: 86.8 fl (ref 78.0–100.0)
Monocytes Absolute: 0.5 10*3/uL (ref 0.1–1.0)
Monocytes Relative: 8.1 % (ref 3.0–12.0)
Neutro Abs: 3.8 10*3/uL (ref 1.4–7.7)
Neutrophils Relative %: 59.1 % (ref 43.0–77.0)
Platelets: 283 10*3/uL (ref 150.0–400.0)
RBC: 4.5 Mil/uL (ref 3.87–5.11)
RDW: 14 % (ref 11.5–15.5)
WBC: 6.4 10*3/uL (ref 4.0–10.5)

## 2021-10-27 LAB — COMPREHENSIVE METABOLIC PANEL
ALT: 75 U/L — ABNORMAL HIGH (ref 0–35)
AST: 27 U/L (ref 0–37)
Albumin: 4 g/dL (ref 3.5–5.2)
Alkaline Phosphatase: 55 U/L (ref 39–117)
BUN: 9 mg/dL (ref 6–23)
CO2: 29 mEq/L (ref 19–32)
Calcium: 9.1 mg/dL (ref 8.4–10.5)
Chloride: 103 mEq/L (ref 96–112)
Creatinine, Ser: 0.74 mg/dL (ref 0.40–1.20)
GFR: 95.88 mL/min (ref >60.00)
Glucose, Bld: 101 mg/dL — ABNORMAL HIGH (ref 70–99)
Potassium: 3.4 mEq/L — ABNORMAL LOW (ref 3.5–5.1)
Sodium: 139 mEq/L (ref 135–145)
Total Bilirubin: 0.5 mg/dL (ref 0.2–1.2)
Total Protein: 6.7 g/dL (ref 6.0–8.3)

## 2021-10-27 LAB — LIPID PANEL
Cholesterol: 241 mg/dL — ABNORMAL HIGH (ref 0–200)
HDL: 71 mg/dL (ref >39.00)
LDL Cholesterol: 140 mg/dL — ABNORMAL HIGH (ref 0–99)
NonHDL: 170.33
Total CHOL/HDL Ratio: 3
Triglycerides: 152 mg/dL — ABNORMAL HIGH (ref 0.0–149.0)
VLDL: 30.4 mg/dL (ref 0.0–40.0)

## 2021-10-27 LAB — HEMOGLOBIN A1C: Hgb A1c MFr Bld: 6.3 % (ref 4.6–6.5)

## 2021-10-27 NOTE — Progress Notes (Signed)
Tracy [name] 46 y.o.   Chief Complaint  Patient presents with   Annual Exam    Left shoulder pain, concern it could be the rotator cuff     HISTORY OF PRESENT ILLNESS: This is a 46 y.o. female here for annual exam. Complaining of right shoulder pain since last May. History of hypertension and prediabetes. Caretaker of aging parents. Overall doing well. No other complaints or medical concerns today. Scheduled to see gynecologist later this month.  HPI   Prior to Admission medications   Medication Sig Start Date End Date Taking? Authorizing Provider  amLODipine (NORVASC) 10 MG tablet TAKE 1 TABLET BY MOUTH EVERY DAY 01/12/21  Yes Roshan Salamon, Ines Bloomer, MD  benzonatate (TESSALON) 100 MG capsule Take 1-2 capsules (100-200 mg total) by mouth 3 (three) times daily as needed for cough. 10/07/21  Yes Jaynee Eagles, PA-C  DULoxetine (CYMBALTA) 30 MG capsule TAKE 1 CAPSULE EVERY DAY 01/22/20  Yes Relena Ivancic, Ines Bloomer, MD  fluticasone [facility]) 50 MCG/ACT nasal spray Place 1 spray into both nostrils daily. 08/27/21  Yes Lynden Oxford Scales, PA-C  levocetirizine (XYZAL) 5 MG tablet Take 1 tablet (5 mg total) by mouth every evening. 10/07/21  Yes Jaynee Eagles, PA-C  lisinopril-hydrochlorothiazide (ZESTORETIC) 20-12.5 MG tablet TAKE 1 TABLET BY MOUTH EVERY DAY 03/27/21  Yes Emmanuela Ghazi, Ines Bloomer, MD  montelukast (SINGULAIR) 10 MG tablet Take 1 tablet (10 mg total) by mouth at bedtime. 08/27/21 02/23/22 Yes Lynden Oxford Scales, PA-C  Multiple Vitamin (MULTIVITAMIN WITH MINERALS) TABS tablet Take 1 tablet by mouth daily.   Yes [provider]    Allergies  Allergen Reactions   Cefdinir Itching   Penicillins Rash, Hives and Swelling    Has patient had a PCN reaction causing immediate rash, facial/tongue/throat swelling, SOB or lightheadedness with hypotension: No Has patient had a PCN reaction causing severe rash involving mucus membranes or skin necrosis: No Has patient had a PCN  reaction that required hospitalization NO Has patient had a PCN reaction occurring within the last 10 years: NO If all of the above answers are "NO", then may proceed with Cephalosporin use. Has patient had a PCN reaction causing immediate rash, facial/tongue/throat swelling, SOB or lightheadedness with hypotension: No Has patient had a PCN reaction causing severe rash involving mucus membranes or skin necrosis: No Has patient had a PCN reaction that required hospitalization NO Has patient had a PCN reaction occurring within the last 10 years: NO If all of the above answers are "NO", then may proceed with Cephalosporin use.    Latex Swelling, Rash and Itching    Patient Active Problem List   Diagnosis Date Noted   Essential (primary) hypertension 12/09/2016    Past Medical History:  Diagnosis Date   Breast lump    Hypertension     Past Surgical History:  Procedure Laterality Date   BREAST LUMPECTOMY WITH RADIOACTIVE SEED LOCALIZATION Left 07/30/2020   Procedure: LEFT BREAST LUMPECTOMY WITH RADIOACTIVE SEED LOCALIZATION;  Surgeon: Erroll Luna, MD;  Location: [facility];  Service: General;  Laterality: Left;   CESAREAN SECTION     TONSILLECTOMY     UTERINE FIBROID SURGERY      Social History   Socioeconomic History   Marital status: Divorced    Spouse name: Not on file   Number of children: Not on file   Years of education: Not on file   Highest education level: Not on file  Occupational History   Not on file  Tobacco  Use   Smoking status: Never   Smokeless tobacco: Never  Vaping Use   Vaping Use: Never used  Substance and Sexual Activity   Alcohol use: Yes    Comment: occ   Drug use: No   Sexual activity: Yes    Birth control/protection: None  Other Topics Concern   Not on file  Social History Narrative   Not on file   Social Determinants of Health   Financial Resource Strain: Not on file  Food Insecurity: Not on file  Transportation  Needs: Not on file  Physical Activity: Not on file  Stress: Not on file  Social Connections: Not on file  Intimate Partner Violence: Not on file    Family History  Problem Relation Age of Onset   Hypertension Mother    Diabetes Mother    Hypertension Father    Diabetes Father    Sleep apnea Neg Hx      Review of Systems  Constitutional: Negative.  Negative for chills and fever.  HENT: Negative.  Negative for congestion and sore throat.   Respiratory: Negative.  Negative for cough and shortness of breath.   Cardiovascular: Negative.  Negative for chest pain and palpitations.  Gastrointestinal:  Negative for abdominal pain, diarrhea, nausea and vomiting.  Genitourinary: Negative.   Musculoskeletal:  Positive for joint pain (Right shoulder pain).  Skin: Negative.  Negative for rash.  Neurological: Negative.  Negative for dizziness and headaches.  All other systems reviewed and are negative.  Today's Vitals   10/27/21 0805  BP: 134/72  Pulse: 77  Temp: 97.8 F (36.6 C)  TempSrc: Oral  Weight: 171 lb 2 oz (77.6 kg)  Height: '5\' 6"'$  (1.676 m)   Body mass index is 27.62 kg/m. Wt Readings from Last 3 Encounters:  10/27/21 171 lb 2 oz (77.6 kg)  02/23/21 174 lb 3.2 oz (79 kg)  02/02/21 175 lb 14.8 oz (79.8 kg)     Physical Exam Vitals reviewed.  Constitutional:      Appearance: Normal appearance.  HENT:     Head: Normocephalic.     Right Ear: Tympanic membrane, ear canal and external ear normal.     Left Ear: Tympanic membrane, ear canal and external ear normal.     Mouth/Throat:     Mouth: Mucous membranes are moist.     Pharynx: Oropharynx is clear.  Eyes:     Extraocular Movements: Extraocular movements intact.     Conjunctiva/sclera: Conjunctivae normal.     Pupils: Pupils are equal, round, and reactive to light.  Cardiovascular:     Rate and Rhythm: Normal rate and regular rhythm.     Pulses: Normal pulses.     Heart sounds: Normal heart sounds.   Pulmonary:     Effort: Pulmonary effort is normal.     Breath sounds: Normal breath sounds.  Abdominal:     General: Bowel sounds are normal. There is no distension.     Palpations: Abdomen is soft.     Tenderness: There is no abdominal tenderness.  Musculoskeletal:     Cervical back: No tenderness.     Comments: Right shoulder: Limited range of motion  Lymphadenopathy:     Cervical: No cervical adenopathy.  Skin:    General: Skin is warm and dry.  Neurological:     General: No focal deficit present.     Mental Status: She is alert and oriented to person, place, and time.  Psychiatric:        Mood  and Affect: Mood normal.        Behavior: Behavior normal.      ASSESSMENT & PLAN: Problem List Items Addressed This Visit       Cardiovascular and Mediastinum   Essential (primary) hypertension - Primary   Relevant Orders   Comprehensive metabolic panel   Other Visit Diagnoses     Prediabetes       Relevant Orders   Hemoglobin A1c   Chronic right shoulder pain       Relevant Orders   Ambulatory referral to Sports Medicine   Need for hepatitis C screening test       Relevant Orders   Hepatitis C antibody screen   Routine general medical examination at a health care facility       Colon cancer screening       Relevant Orders   Ambulatory referral to Gastroenterology   Screening for deficiency anemia       Relevant Orders   CBC with Differential   Screening for lipoid disorders       Relevant Orders   Lipid panel   Screening for endocrine, metabolic and immunity disorder          Modifiable risk factors discussed with patient. Anticipatory guidance according to age provided. The following topics were also discussed: Social Determinants of Health Smoking.  Non-smoker Diet and nutrition.  Eating better Benefits of exercise Cancer screening and need for colon cancer screening with colonoscopy Vaccinations reviewed and recommendations Chronic right shoulder  pain and need for sports medicine evaluation Cardiovascular risk assessment and need for blood work The 10-year ASCVD risk score (Arnett DK, et al., 2019) is: 3.3%   Values used to calculate the score:     Age: 80 years     Sex: Female     Is Non-Hispanic African American: Yes     Diabetic: No     Tobacco smoker: No     Systolic Blood Pressure: 716 mmHg     Is BP treated: Yes     HDL Cholesterol: 63.5 mg/dL     Total Cholesterol: 230 mg/dL  Mental health including depression and anxiety Fall and accident prevention  Patient Instructions  Health Maintenance, Female Adopting a healthy lifestyle and getting preventive care are important in promoting health and wellness. Ask your health care provider about: The right schedule for you to have regular tests and exams. Things you can do on your own to prevent diseases and keep yourself healthy. What should I know about diet, weight, and exercise? Eat a healthy diet  Eat a diet that includes plenty of vegetables, fruits, low-fat dairy products, and lean protein. Do not eat a lot of foods that are high in solid fats, added sugars, or sodium. Maintain a healthy weight Body mass index (BMI) is used to identify weight problems. It estimates body fat based on height and weight. Your health care provider can help determine your BMI and help you achieve or maintain a healthy weight. Get regular exercise Get regular exercise. This is one of the most important things you can do for your health. Most adults should: Exercise for at least 150 minutes each week. The exercise should increase your heart rate and make you sweat (moderate-intensity exercise). Do strengthening exercises at least twice a week. This is in addition to the moderate-intensity exercise. Spend less time sitting. Even light physical activity can be beneficial. Watch cholesterol and blood lipids Have your blood tested for lipids and cholesterol at 48 years  of age, then have this  test every 5 years. Have your cholesterol levels checked more often if: Your lipid or cholesterol levels are high. You are older than 48 years of age. You are at high risk for heart disease. What should I know about cancer screening? Depending on your health history and family history, you may need to have cancer screening at various ages. This may include screening for: Breast cancer. Cervical cancer. Colorectal cancer. Skin cancer. Lung cancer. What should I know about heart disease, diabetes, and high blood pressure? Blood pressure and heart disease High blood pressure causes heart disease and increases the risk of stroke. This is more likely to develop in people who have high blood pressure readings or are overweight. Have your blood pressure checked: Every 3-5 years if you are 20-29 years of age. Every year if you are 45 years old or older. Diabetes Have regular diabetes screenings. This checks your fasting blood sugar level. Have the screening done: Once every three years after age 2 if you are at a normal weight and have a low risk for diabetes. More often and at a younger age if you are overweight or have a high risk for diabetes. What should I know about preventing infection? Hepatitis B If you have a higher risk for hepatitis B, you should be screened for this virus. Talk with your health care provider to find out if you are at risk for hepatitis B infection. Hepatitis C Testing is recommended for: Everyone born from [birth year] through 1967. Anyone with known risk factors for hepatitis C. Sexually transmitted infections (STIs) Get screened for STIs, including gonorrhea and chlamydia, if: You are sexually active and are younger than 48 years of age. You are older than 48 years of age and your health care provider tells you that you are at risk for this type of infection. Your sexual activity has changed since you were last screened, and you are at increased risk for chlamydia or  gonorrhea. Ask your health care provider if you are at risk. Ask your health care provider about whether you are at high risk for HIV. Your health care provider may recommend a prescription medicine to help prevent HIV infection. If you choose to take medicine to prevent HIV, you should first get tested for HIV. You should then be tested every 3 months for as long as you are taking the medicine. Pregnancy If you are about to stop having your period (premenopausal) and you may become pregnant, seek counseling before you get pregnant. Take 400 to 800 micrograms (mcg) of folic acid every day if you become pregnant. Ask for birth control (contraception) if you want to prevent pregnancy. Osteoporosis and menopause Osteoporosis is a disease in which the bones lose minerals and strength with aging. This can result in bone fractures. If you are 71 years old or older, or if you are at risk for osteoporosis and fractures, ask your health care provider if you should: Be screened for bone loss. Take a calcium or vitamin D supplement to lower your risk of fractures. Be given hormone replacement therapy (HRT) to treat symptoms of menopause. Follow these instructions at home: Alcohol use Do not drink alcohol if: Your health care provider tells you not to drink. You are pregnant, may be pregnant, or are planning to become pregnant. If you drink alcohol: Limit how much you have to: 0-1 drink a day. Know how much alcohol is in your drink. In the [place] one drink equals one  12 oz bottle of beer (355 mL), one 5 oz glass of wine (148 mL), or one 1 oz glass of hard liquor (44 mL). Lifestyle Do not use any products that contain nicotine or tobacco. These products include cigarettes, chewing tobacco, and vaping devices, such as e-cigarettes. If you need help quitting, ask your health care provider. Do not use street drugs. Do not share needles. Ask your health care provider for help if you need support or  information about quitting drugs. General instructions Schedule regular health, dental, and eye exams. Stay current with your vaccines. Tell your health care provider if: You often feel depressed. You have ever been abused or do not feel safe at home. Summary Adopting a healthy lifestyle and getting preventive care are important in promoting health and wellness. Follow your health care provider's instructions about healthy diet, exercising, and getting tested or screened for diseases. Follow your health care provider's instructions on monitoring your cholesterol and blood pressure. This information is not intended to replace advice given to you by your health care provider. Make sure you discuss any questions you have with your health care provider. Document Revised: 05/25/2020 Document Reviewed: 05/25/2020 Elsevier Patient Education  [place], MD [facility]

## 2021-10-27 NOTE — Patient Instructions (Signed)

## 2021-10-28 LAB — HEPATITIS C ANTIBODY: Hepatitis C Ab: NONREACTIVE
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
## 2021-10-29 ENCOUNTER — Ambulatory Visit (INDEPENDENT_AMBULATORY_CARE_PROVIDER_SITE_OTHER): Payer: No Typology Code available for payment source

## 2021-10-29 ENCOUNTER — Ambulatory Visit (INDEPENDENT_AMBULATORY_CARE_PROVIDER_SITE_OTHER): Payer: No Typology Code available for payment source | Admitting: Sports Medicine

## 2021-10-29 VITALS — BP 118/68 | Ht 66.0 in | Wt 171.0 lb

## 2021-10-29 DIAGNOSIS — M25511 Pain in right shoulder: Secondary | ICD-10-CM

## 2021-10-29 DIAGNOSIS — G8929 Other chronic pain: Secondary | ICD-10-CM

## 2021-10-29 DIAGNOSIS — Z89231 Acquired absence of right shoulder: Secondary | ICD-10-CM

## 2021-10-29 DIAGNOSIS — M7541 Impingement syndrome of right shoulder: Secondary | ICD-10-CM

## 2021-10-29 NOTE — Patient Instructions (Signed)
Good to see you Shoulder HEP  Pt referral  3-4 week follow up   

## 2021-12-02 ENCOUNTER — Encounter: Payer: Self-pay | Admitting: Allergy & Immunology

## 2021-12-02 ENCOUNTER — Ambulatory Visit (INDEPENDENT_AMBULATORY_CARE_PROVIDER_SITE_OTHER): Payer: No Typology Code available for payment source | Admitting: Allergy & Immunology

## 2021-12-02 VITALS — BP 128/84 | HR 76 | Temp 98.4°F | Resp 16 | Ht 62.0 in | Wt 169.4 lb

## 2021-12-02 DIAGNOSIS — J3089 Other allergic rhinitis: Secondary | ICD-10-CM

## 2021-12-02 DIAGNOSIS — J4540 Moderate persistent asthma, uncomplicated: Secondary | ICD-10-CM | POA: Diagnosis not present

## 2021-12-02 DIAGNOSIS — B999 Unspecified infectious disease: Secondary | ICD-10-CM

## 2021-12-02 DIAGNOSIS — L2089 Other atopic dermatitis: Secondary | ICD-10-CM

## 2021-12-02 DIAGNOSIS — K219 Gastro-esophageal reflux disease without esophagitis: Secondary | ICD-10-CM | POA: Insufficient documentation

## 2021-12-02 MED ORDER — RYALTRIS 665-25 MCG/ACT NA SUSP: 1.0000 | Freq: Two times a day (BID) | NASAL | 5 refills | Status: AC | PRN

## 2021-12-02 MED ORDER — OMEPRAZOLE 40 MG PO CPDR: 40.0000 mg | DELAYED_RELEASE_CAPSULE | Freq: Every day | ORAL | 5 refills | Status: DC

## 2021-12-02 MED ORDER — LEVOCETIRIZINE DIHYDROCHLORIDE 5 MG PO TABS: 5.0000 mg | ORAL_TABLET | Freq: Every evening | ORAL | 1 refills | Status: DC

## 2021-12-02 MED ORDER — FLUTICASONE FUROATE-VILANTEROL 100-25 MCG/ACT IN AEPB: 1.0000 | INHALATION_SPRAY | Freq: Every day | RESPIRATORY_TRACT | 1 refills | Status: DC

## 2021-12-02 MED ORDER — ALBUTEROL SULFATE HFA 108 (90 BASE) MCG/ACT IN AERS: 2.0000 | INHALATION_SPRAY | Freq: Four times a day (QID) | RESPIRATORY_TRACT | 2 refills | Status: DC | PRN

## 2021-12-02 MED ORDER — MONTELUKAST SODIUM 10 MG PO TABS: 10.0000 mg | ORAL_TABLET | Freq: Every day | ORAL | 1 refills | Status: DC

## 2021-12-02 NOTE — Progress Notes (Signed)
NEW PATIENT  Date of Service/Encounter:  12/02/21  Consult requested by: Horald Pollen, MD   Assessment:   Moderate persistent asthma, uncomplicated   Perennial allergic rhinitis (cat)  Recurrent infections   Eczema  GERD - poorly controlled  Hypertension - on amlodipine  Plan/Recommendations:    1. Moderate persistent asthma, uncomplicated - Lung testing looked decent, but it did improve with the albuterol treatment. - Because of these recurrent symptoms, we are going to start a daily controller medication. - Spacer sample and demonstration provided. - Daily controller medication(s): Breo 100/50mg one puff once daily - Prior to physical activity: albuterol 2 puffs 10-15 minutes before physical activity. - Rescue medications: albuterol 4 puffs every 4-6 hours as needed - Asthma control goals:  * Full participation in all desired activities (may need albuterol before activity) * Albuterol use two time or less a week on average (not counting use with activity) * Cough interfering with sleep two time or less a month * Oral steroids no more than once a year * No hospitalizations  2. Perennial allergic rhinitis - Testing today showed: cat - Copy of test results provided.  - Avoidance measures provided. - Continue with: Xyzal (levocetirizine) '5mg'$  tablet once daily and Singulair (montelukast) '10mg'$  daily - Start taking: Ryaltris (olopatadine/mometasone) two sprays per nostril 1-2 times daily as needed - The nose spray contains a nasal steroid AND a nasal antihistamine.  - You can use an extra dose of the antihistamine, if needed, for breakthrough symptoms.  - Consider nasal saline rinses 1-2 times daily to remove allergens from the nasal cavities as well as help with mucous clearance (this is especially helpful to do before the nasal sprays are given)  3. Recurrent infections - We will obtain some screening labs to evaluate your immune system.  - Labs to  evaluate the quantitative ([facility] aspects of your immune system: IgG/IgA/IgM, CBC with differential - Labs to evaluate the qualitative ([place] aspects of your immune system: CH50, Pneumococcal titers, Tetanus titers, Diphtheria titers - We may consider immunizations with Pneumovax and Tdap to challenge your immune system, and then obtain repeat titers in 4-6 weeks.   4. GERD - Add on omeprazole '40mg'$  once daily. - Hopefully this will help over time and control the hoarseness.   5. Return in about 4 weeks (around 12/30/2021).   This note in its entirety was forwarded to the Provider who requested this consultation.  Subjective:   Tracy LINAis a 46y.o. female presenting today for evaluation of  Chief Complaint  Patient presents with   Allergic Rhinitis     States she has been going to Urgent care for flare ups.    Asthma    Says she has issues with asthma.   Eczema    Says she has dealt with issues since childhood   Urticaria    Had issues 2 years ago. Random hives.    Tracy [name] a history of the following: Patient Active Problem List   Diagnosis Date Noted   Essential (primary) hypertension 12/09/2016    History obtained from: chart review and patient.  Tracy Doomswas referred by SHorald Pollen MD.     Tracy [name] a 46y.o. female presenting for an evaluation of recurrent infections and possible asthma and allergies .   Asthma/Respiratory Symptom History: She has had bronchitis 3-4 times in the last few years. She is treated through Urgent Care multiple times for  this. She was told that she likely has asthma and needs to see an Allergist to get this addressed. She has been on steroids os much over the last two years. Steroids do help a little bit, but when she is off of them for a bit, she falls into the same situation with allergies and bronchitis.  She reports that cigarette smoke is a particular trigger for her. She also feels that when  she is exposed to wood smoke, this makes things worse.   Allergic Rhinitis Symptom History: She did have her adenoids removed when she was little. She was snoring at the time.  She did not have asthma symptoms when she was younger. She does use Flonase which is what she is on now. She is also on levocetirizine and montelukast. She started Singulair around three months ago. She is down to the last couple of pills. It did help initially, but it does not seem to be helping now.   Skin Symptom History: She did have eczema that was fairly severe as a child. She has some lesions even now on her hands and her arms. She has a spot on her eye. She uses her mother's medicine including Protopic and a topical steroid. This works well for her rashes.  She has never seen Dermatology. She had a flare this past summer which was the worst in a couple of years. 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   GERD Symptom History: She does have a history of reflux. This has been worse lately. It was very intermittent initially. But she had an episode with some chicken noodle soup and then she laid down and felt that her throat was being torn out.   Otherwise, there is no history of other atopic diseases, including drug allergies, stinging insect allergies, or contact dermatitis. There is no significant infectious history. Vaccinations are up to date.    Past Medical History: Patient Active Problem List   Diagnosis Date Noted   Essential (primary) hypertension 12/09/2016    Medication List:  Allergies as of 12/02/2021       Reactions   Penicillins Rash, Hives, Swelling   Has patient had a PCN reaction causing immediate rash, facial/tongue/throat swelling, SOB or lightheadedness with hypotension: No Has patient had a PCN reaction causing severe rash involving mucus membranes or skin necrosis: No Has patient had a PCN reaction that required hospitalization NO Has  patient had a PCN reaction occurring within the last 10 years: NO If all of the above answers are "NO", then may proceed with Cephalosporin use. Has patient had a PCN reaction causing immediate rash, facial/tongue/throat swelling, SOB or lightheadedness with hypotension: No Has patient had a PCN reaction causing severe rash involving mucus membranes or skin necrosis: No Has patient had a PCN reaction that required hospitalization NO Has patient had a PCN reaction occurring within the last 10 years: NO If all of the above answers are "NO", then may proceed with Cephalosporin use.   Cefdinir Itching   Latex Itching, Swelling, Rash        Medication List        Accurate as of December 02, 2021  1:31 PM. If you have any questions, ask your nurse or doctor.          amLODipine 10 MG tablet Commonly known as: NORVASC TAKE 1 TABLET BY MOUTH EVERY DAY   benzonatate 100 MG capsule Commonly known as: TESSALON  Take 1-2 capsules (100-200 mg total) by mouth 3 (three) times daily as needed for cough.   DULoxetine 30 MG capsule Commonly known as: CYMBALTA TAKE 1 CAPSULE EVERY DAY   fluticasone 50 MCG/ACT nasal spray Commonly known as: FLONASE Place 1 spray into both nostrils daily.   fluticasone furoate-vilanterol 100-25 MCG/ACT Aepb Commonly known as: Breo Ellipta Inhale 1 puff into the lungs daily. Started by: Valentina Shaggy, MD   levocetirizine 5 MG tablet Commonly known as: XYZAL Take 1 tablet (5 mg total) by mouth every evening.   lisinopril-hydrochlorothiazide 20-12.5 MG tablet Commonly known as: ZESTORETIC TAKE 1 TABLET BY MOUTH EVERY DAY   montelukast 10 MG tablet Commonly known as: Singulair Take 1 tablet (10 mg total) by mouth at bedtime.   multivitamin with minerals Tabs tablet Take 1 tablet by mouth daily.   omeprazole 40 MG capsule Commonly known as: PRILOSEC Take 1 capsule (40 mg total) by mouth daily. Started by: Valentina Shaggy, MD         Birth History: non-contributory  Developmental History: non-contributory  Past Surgical History: Past Surgical History:  Procedure Laterality Date   BREAST LUMPECTOMY WITH RADIOACTIVE SEED LOCALIZATION Left 07/30/2020   Procedure: LEFT BREAST LUMPECTOMY WITH RADIOACTIVE SEED LOCALIZATION;  Surgeon: Erroll Luna, MD;  Location: [place];  Service: General;  Laterality: Left;   [place]  2001   CESAREAN SECTION  06/19/2008   TONSILLECTOMY  1992   UTERINE FIBROID SURGERY  10/2010     Family History: Family History  Problem Relation Age of Onset   Eczema Mother    Asthma Mother    Allergic rhinitis Mother    Hypertension Mother    Diabetes Mother    Eczema Father    Hypertension Father    Diabetes Father    Asthma Brother    Allergic rhinitis Brother    Allergic rhinitis Maternal Grandmother    Asthma Paternal Grandmother    Sleep apnea Neg Hx      Social History: Hasini lives at home with her family.  1 tablet built in 1994.  There is laminate hardwood throughout the home as well as carpeting in the bedroom.  She has gas heating and central cooling.  There is no tobacco exposure.  She currently works as a Retail banker for the past 5 years.  She is exposed to fumes, chemicals, and dust in her hobbies.  She does not have a HEPA filter.  [place].   Review of Systems  Constitutional: Negative.  Negative for chills, fever, malaise/fatigue and weight loss.  HENT:  Positive for congestion. Negative for ear discharge and ear pain.   Eyes:  Negative for pain, discharge and redness.  Respiratory:  Positive for cough, sputum production and shortness of breath. Negative for wheezing.   Cardiovascular: Negative.  Negative for chest pain and palpitations.  Gastrointestinal:  Negative for abdominal pain, heartburn, nausea and vomiting.  Skin: Negative.  Negative for itching and  rash.  Neurological:  Negative for dizziness and headaches.  Endo/Heme/Allergies:  Positive for environmental allergies. Does not bruise/bleed easily.       Objective:   Blood pressure 128/84, pulse 76, temperature 98.4 F (36.9 C), temperature source Temporal, resp. rate 16, height '5\' 2"'$  (1.575 m), weight 169 lb 6.4 oz (76.8 kg), SpO2 100 %. Body mass index is 30.98 kg/m.     Physical Exam Vitals reviewed.  Constitutional:      Appearance: She is well-developed.  HENT:     Head: Normocephalic and atraumatic.     Right Ear: Tympanic membrane, ear canal and external ear normal. No drainage, swelling or tenderness. Tympanic membrane is not injected, scarred, erythematous, retracted or bulging.     Left Ear: Tympanic membrane, ear canal and external ear normal. No drainage, swelling or tenderness. Tympanic membrane is not injected, scarred, erythematous, retracted or bulging.     Nose: No nasal deformity, septal deviation, mucosal edema or rhinorrhea.     Right Turbinates: Enlarged, swollen and pale.     Left Turbinates: Enlarged, swollen and pale.     Right Sinus: No maxillary sinus tenderness or frontal sinus tenderness.     Left Sinus: No maxillary sinus tenderness or frontal sinus tenderness.     Mouth/Throat:     Mouth: Mucous membranes are not pale and not dry.     Pharynx: Uvula midline.  Eyes:     General:        Right eye: No discharge.        Left eye: No discharge.     Conjunctiva/sclera: Conjunctivae normal.     Right eye: Right conjunctiva is not injected. No chemosis.    Left eye: Left conjunctiva is not injected. No chemosis.    Pupils: Pupils are equal, round, and reactive to light.  Cardiovascular:     Rate and Rhythm: Normal rate and regular rhythm.     Heart sounds: Normal heart sounds.  Pulmonary:     Effort: Pulmonary effort is normal. No tachypnea, accessory muscle usage or respiratory distress.     Breath sounds: Normal breath sounds. No wheezing,  rhonchi or rales.     Comments: Moving air well in all lung fields.  No increased work of breathing. Chest:     Chest wall: No tenderness.  Abdominal:     Tenderness: There is no abdominal tenderness. There is no guarding or rebound.  Lymphadenopathy:     Head:     Right side of head: No submandibular, tonsillar or occipital adenopathy.     Left side of head: No submandibular, tonsillar or occipital adenopathy.     Cervical: No cervical adenopathy.  Skin:    General: Skin is warm.     Capillary Refill: Capillary refill takes less than 2 seconds.     Coloration: Skin is not pale.     Findings: No abrasion, erythema, petechiae or rash. Rash is not papular, urticarial or vesicular.     Comments: No eczematous or urticarial lesions noted.  She does have some excoriations present on the bilateral arms.  Neurological:     Mental Status: She is alert.  Psychiatric:        Behavior: Behavior is cooperative.      Diagnostic studies:     Spirometry: results normal (FEV1: 1.85/85%, FVC: 2.26/84%, FEV1/FVC: 82%).    Spirometry consistent with normal pattern. Albuterol four puffs via MDI treatment given in clinic with significant improvement in FEV1 per ATS criteria. Her FEV1 increased 12% (230 mL).  Allergy Studies:     Airborne Adult Perc - 12/02/21 0948     Time Antigen Placed 0948    Allergen Manufacturer Lavella Hammock    Location Back    Number of Test 59    Panel 1 Select    1. Control-Buffer 50% Glycerol Negative    2. Control-Histamine 1 mg/ml 2+    3. Albumin saline Negative  4. [place] Negative    5. [place] Negative    6. Johnson Negative    7. [place] Blue Negative    8. Meadow Fescue 2+    9. Perennial Rye 2+    10. Sweet Vernal 2+    11. Timothy 2+    12. Cocklebur Negative    13. Burweed Marshelder Negative    14. Ragweed, short Negative    15. Ragweed, Giant Negative    16. Plantain,  English Negative    17. Lamb's Quarters Negative    18. Sheep Sorrell Negative     19. Rough Pigweed Negative    20. Marsh Elder, Rough Negative    21. Mugwort, Common Negative    22. Ash mix Negative    23. Birch mix Negative    24. Beech American 2+    25. Box, Elder Negative    26. Cedar, red Negative    27. Cottonwood, [place] Negative    28. Elm mix Negative    29. Hickory Negative    30. Maple mix Negative    31. Oak, [place] mix Negative    32. Pecan Pollen 2+    33. Pine mix Negative    34. Sycamore Eastern 2+    35. [place], Black Pollen 2+    36. Alternaria alternata Negative    37. Cladosporium Herbarum Negative    38. Aspergillus mix Negative    39. Penicillium mix Negative    40. Bipolaris sorokiniana (Helminthosporium) Negative    41. Drechslera spicifera (Curvularia) Negative    42. Mucor plumbeus Negative    43. Fusarium moniliforme 2+    44. Aureobasidium pullulans (pullulara) Negative    45. Rhizopus oryzae Negative    46. Botrytis cinera Negative    47. Epicoccum nigrum Negative    48. Phoma betae Negative    49. Candida Albicans 2+    50. Trichophyton mentagrophytes Negative    51. Mite, D Farinae  5,000 AU/ml Negative    52. Mite, D Pteronyssinus  5,000 AU/ml 2+    53. Cat Hair 10,000 BAU/ml Negative    54.  Dog Epithelia Negative    55. Mixed Feathers Negative    56. Horse Epithelia Negative    57. Cockroach, German Negative    58. Mouse Negative    59. Tobacco Leaf Negative             Intradermal - 12/02/21 1015     Time Antigen Placed 1030    Allergen Manufacturer Greer    Location Arm    Number of Test 11    Control Negative    [place] Negative    Johnson Negative    Ragweed mix Negative    Weed mix Negative    Mold 1 Negative    Mold 2 Negative    Mold 3 Negative    Cat 3+    Dog Negative    Cockroach Negative             Allergy testing results were read and interpreted by myself, documented by clinical staff.         Salvatore Marvel, MD [facility]

## 2021-12-02 NOTE — Patient Instructions (Addendum)
1. Moderate persistent asthma, uncomplicated - Lung testing looked decent, but it did improve with the albuterol treatment. - Because of these recurrent symptoms, we are going to start a daily controller medication. - Spacer sample and demonstration provided. - Daily controller medication(s): Breo 100/12mg one puff once daily - Prior to physical activity: albuterol 2 puffs 10-15 minutes before physical activity. - Rescue medications: albuterol 4 puffs every 4-6 hours as needed - Asthma control goals:  * Full participation in all desired activities (may need albuterol before activity) * Albuterol use two time or less a week on average (not counting use with activity) * Cough interfering with sleep two time or less a month * Oral steroids no more than once a year * No hospitalizations  2. Perennial allergic rhinitis - Testing today showed: cat. - Copy of test results provided.  - Avoidance measures provided. - Continue with: Xyzal (levocetirizine) '5mg'$  tablet once daily and Singulair (montelukast) '10mg'$  daily - Start taking: Ryaltris (olopatadine/mometasone) two sprays per nostril 1-2 times daily as needed - The nose spray contains a nasal steroid AND a nasal antihistamine.  - You can use an extra dose of the antihistamine, if needed, for breakthrough symptoms.  - Consider nasal saline rinses 1-2 times daily to remove allergens from the nasal cavities as well as help with mucous clearance (this is especially helpful to do before the nasal sprays are given)  3. Recurrent infections - We will obtain some screening labs to evaluate your immune system.  - Labs to evaluate the quantitative ([facility] aspects of your immune system: IgG/IgA/IgM, CBC with differential - Labs to evaluate the qualitative ([place] aspects of your immune system: CH50, Pneumococcal titers, Tetanus titers, Diphtheria titers - We may consider immunizations with Pneumovax and Tdap to challenge your immune  system, and then obtain repeat titers in 4-6 weeks.   4. GERD - Add on omeprazole '40mg'$  once daily. - Hopefully this will help over time and control the hoarseness.   5. Return in about 4 weeks (around 12/30/2021).    Please inform [place]of any Emergency Department visits, hospitalizations, or changes in symptoms. Call u[place]abefore going to the ED for breathing or allergy symptoms since we might be able to fit you in for a sick visit. Feel free to contact u[place]oreaanytime with any questions, problems, or concerns.  It was a pleasure to meet you and your family today!  Websites that have reliable patient information: 1. American Academy of Asthma, Allergy, and Immunology: www.aaaai.org 2. Food Allergy Research and Education (FARE): foodallergy.org 3. Mothers of Asthmatics: http://www.asthmacommunitynetwork.org 4. American College of Allergy, Asthma, and Immunology: www.acaai.org   COVID-19 Vaccine Information can be found at: [email or web address] questions related to vaccine distribution or appointments, please email [email or web address] or call [phone]   We realize that you might be concerned about having an allergic reaction to the COVID19 vaccines. To help with that concern, WE ARE OFFERING THE COVID19 VACCINES IN OUR OFFICE! Ask the front desk for dates!     "Like" u[place]on Facebook and Instagram for our latest updates!      A healthy democracy works best when ANew York Life Insuranceparticipate! Make sure you are registered to vote! If you have moved or changed any of your contact information, you will need to get this updated before voting!  In some cases, you MAY be able to register to vote online: [email or web address]     Airborne Adult Perc - 12/02/21 01761  Time Antigen Placed 7741    Allergen Manufacturer Greer    Location Back    Number of Test 59    Panel 1 Select    1. Control-Buffer 50%  Glycerol Negative    2. Control-Histamine 1 mg/ml 2+    3. Albumin saline Negative    4. [place] Negative    5. [place] Negative    6. Johnson Negative    7. [place] Blue Negative    8. Meadow Fescue 2+    9. Perennial Rye 2+    10. Sweet Vernal 2+    11. Timothy 2+    12. Cocklebur Negative    13. Burweed Marshelder Negative    14. Ragweed, short Negative    15. Ragweed, Giant Negative    16. Plantain,  English Negative    17. Lamb's Quarters Negative    18. Sheep Sorrell Negative    19. Rough Pigweed Negative    20. Marsh Elder, Rough Negative    21. Mugwort, Common Negative    22. Ash mix Negative    23. Birch mix Negative    24. Beech American 2+    25. Box, Elder Negative    26. Cedar, red Negative    27. Cottonwood, [place] Negative    28. Elm mix Negative    29. Hickory Negative    30. Maple mix Negative    31. Oak, [place] mix Negative    32. Pecan Pollen 2+    33. Pine mix Negative    34. Sycamore Eastern 2+    35. [place], Black Pollen 2+    36. Alternaria alternata Negative    37. Cladosporium Herbarum Negative    38. Aspergillus mix Negative    39. Penicillium mix Negative    40. Bipolaris sorokiniana (Helminthosporium) Negative    41. Drechslera spicifera (Curvularia) Negative    42. Mucor plumbeus Negative    43. Fusarium moniliforme 2+    44. Aureobasidium pullulans (pullulara) Negative    45. Rhizopus oryzae Negative    46. Botrytis cinera Negative    47. Epicoccum nigrum Negative    48. Phoma betae Negative    49. Candida Albicans 2+    50. Trichophyton mentagrophytes Negative    51. Mite, D Farinae  5,000 AU/ml Negative    52. Mite, D Pteronyssinus  5,000 AU/ml 2+    53. Cat Hair 10,000 BAU/ml Negative    54.  Dog Epithelia Negative    55. Mixed Feathers Negative    56. Horse Epithelia Negative    57. Cockroach, German Negative    58. Mouse Negative    59. Tobacco Leaf Negative             Intradermal - 12/02/21 1015     Time Antigen  Placed 1030    Allergen Manufacturer Greer    Location Arm    Number of Test 11    Control Negative    [place] Negative    Johnson Negative    Ragweed mix Negative    Weed mix Negative    Mold 1 Negative    Mold 2 Negative    Mold 3 Negative    Cat 3+    Dog Negative    Cockroach Negative             Control of Dog or Cat Allergen  Avoidance is the best way to manage a dog or cat allergy. If you have a dog or cat and are allergic to  dog or cats, consider removing the dog or cat from the home. If you have a dog or cat but don't want to find it a new home, or if your family wants a pet even though someone in the household is allergic, here are some strategies that may help keep symptoms at bay:  Keep the pet out of your bedroom and restrict it to only a few rooms. Be advised that keeping the dog or cat in only one room will not limit the allergens to that room. Don't pet, hug or kiss the dog or cat; if you do, wash your hands with soap and water. High-efficiency particulate air (HEPA) cleaners run continuously in a bedroom or living room can reduce allergen levels over time. Regular use of a high-efficiency vacuum cleaner or a central vacuum can reduce allergen levels. Giving your dog or cat a bath at least once a week can reduce airborne allergen.

## 2021-12-03 ENCOUNTER — Ambulatory Visit: Payer: No Typology Code available for payment source | Admitting: Sports Medicine

## 2021-12-15 LAB — STREP PNEUMONIAE 23 SEROTYPES IGG
Pneumo Ab Type 1*: 6.8 ug/mL (ref >1.3)
Pneumo Ab Type 12 (12F)*: 0.2 ug/mL — ABNORMAL LOW (ref >1.3)
Pneumo Ab Type 14*: 0.5 ug/mL — ABNORMAL LOW (ref >1.3)
Pneumo Ab Type 17 (17F)*: 1.2 ug/mL — ABNORMAL LOW (ref >1.3)
Pneumo Ab Type 19 (19F)*: 2.3 ug/mL (ref >1.3)
Pneumo Ab Type 2*: 2.2 ug/mL (ref >1.3)
Pneumo Ab Type 20*: 3.5 ug/mL (ref >1.3)
Pneumo Ab Type 22 (22F)*: 1.6 ug/mL (ref >1.3)
Pneumo Ab Type 23 (23F)*: 1.4 ug/mL (ref >1.3)
Pneumo Ab Type 26 (6B)*: 0.6 ug/mL — ABNORMAL LOW (ref >1.3)
Pneumo Ab Type 3*: 1.1 ug/mL — ABNORMAL LOW (ref >1.3)
Pneumo Ab Type 34 (10A)*: 5 ug/mL (ref >1.3)
Pneumo Ab Type 4*: 0.2 ug/mL — ABNORMAL LOW (ref >1.3)
Pneumo Ab Type 43 (11A)*: 0.9 ug/mL — ABNORMAL LOW (ref >1.3)
Pneumo Ab Type 5*: 14.3 ug/mL (ref >1.3)
Pneumo Ab Type 51 (7F)*: 0.9 ug/mL — ABNORMAL LOW (ref >1.3)
Pneumo Ab Type 54 (15B)*: 2.7 ug/mL (ref >1.3)
Pneumo Ab Type 56 (18C)*: 0.2 ug/mL — ABNORMAL LOW (ref >1.3)
Pneumo Ab Type 57 (19A)*: 1.9 ug/mL (ref >1.3)
Pneumo Ab Type 68 (9V)*: 0.3 ug/mL — ABNORMAL LOW (ref >1.3)
Pneumo Ab Type 70 (33F)*: 1.5 ug/mL (ref >1.3)
Pneumo Ab Type 8*: 2.7 ug/mL (ref >1.3)
Pneumo Ab Type 9 (9N)*: 0.9 ug/mL — ABNORMAL LOW (ref >1.3)

## 2021-12-15 LAB — CBC WITH DIFFERENTIAL
Basophils Absolute: 0.1 10*3/uL (ref 0.0–0.2)
Basos: 1 %
EOS (ABSOLUTE): 0.1 10*3/uL (ref 0.0–0.4)
Eos: 1 %
Hematocrit: 44.4 % (ref 34.0–46.6)
Hemoglobin: 14.6 g/dL (ref 11.1–15.9)
Immature Grans (Abs): 0 10*3/uL (ref 0.0–0.1)
Immature Granulocytes: 1 %
Lymphocytes Absolute: 2.2 10*3/uL (ref 0.7–3.1)
Lymphs: 28 %
MCH: 28.9 pg (ref 26.6–33.0)
MCHC: 32.9 g/dL (ref 31.5–35.7)
MCV: 88 fL (ref 79–97)
Monocytes Absolute: 0.6 10*3/uL (ref 0.1–0.9)
Monocytes: 7 %
Neutrophils Absolute: 4.8 10*3/uL (ref 1.4–7.0)
Neutrophils: 62 %
RBC: 5.06 x10E6/uL (ref 3.77–5.28)
RDW: 13.7 % (ref 11.7–15.4)
WBC: 7.8 10*3/uL (ref 3.4–10.8)

## 2021-12-15 LAB — IGG, IGA, IGM
IgA/Immunoglobulin A, Serum: 209 mg/dL (ref 87–352)
IgG (Immunoglobin G), Serum: 1118 mg/dL (ref 586–1602)
IgM (Immunoglobulin M), Srm: 98 mg/dL (ref 26–217)

## 2021-12-15 LAB — DIPHTHERIA / TETANUS ANTIBODY PANEL
Diphtheria Ab: 0.47 IU/mL (ref <0.10)
Tetanus Ab, IgG: 2.69 IU/mL (ref <0.10)

## 2021-12-15 LAB — COMPLEMENT, TOTAL: Compl, Total (CH50): >60 U/mL (ref >41)

## 2021-12-15 LAB — IGE: IgE (Immunoglobulin E), Serum: 61 IU/mL (ref 6–495)

## 2021-12-30 ENCOUNTER — Ambulatory Visit (INDEPENDENT_AMBULATORY_CARE_PROVIDER_SITE_OTHER): Payer: No Typology Code available for payment source | Admitting: Allergy & Immunology

## 2021-12-30 ENCOUNTER — Encounter: Payer: Self-pay | Admitting: Allergy & Immunology

## 2021-12-30 ENCOUNTER — Other Ambulatory Visit: Payer: Self-pay

## 2021-12-30 VITALS — BP 122/74 | HR 72 | Temp 98.1°F | Resp 16 | Ht 62.0 in | Wt 168.4 lb

## 2021-12-30 DIAGNOSIS — K219 Gastro-esophageal reflux disease without esophagitis: Secondary | ICD-10-CM

## 2021-12-30 DIAGNOSIS — Z23 Encounter for immunization: Secondary | ICD-10-CM | POA: Diagnosis not present

## 2021-12-30 DIAGNOSIS — L2089 Other atopic dermatitis: Secondary | ICD-10-CM | POA: Diagnosis not present

## 2021-12-30 DIAGNOSIS — J3089 Other allergic rhinitis: Secondary | ICD-10-CM | POA: Diagnosis not present

## 2021-12-30 DIAGNOSIS — B999 Unspecified infectious disease: Secondary | ICD-10-CM | POA: Diagnosis not present

## 2021-12-30 DIAGNOSIS — J4540 Moderate persistent asthma, uncomplicated: Secondary | ICD-10-CM | POA: Diagnosis not present

## 2021-12-30 MED ORDER — FLUTICASONE-SALMETEROL 250-50 MCG/ACT IN AEPB: 1.0000 | INHALATION_SPRAY | Freq: Two times a day (BID) | RESPIRATORY_TRACT | 5 refills | Status: DC

## 2021-12-30 MED ORDER — ALBUTEROL SULFATE HFA 108 (90 BASE) MCG/ACT IN AERS: 2.0000 | INHALATION_SPRAY | Freq: Four times a day (QID) | RESPIRATORY_TRACT | 2 refills | Status: DC | PRN

## 2021-12-30 MED ORDER — BENZONATATE 100 MG PO CAPS: 100.0000 mg | ORAL_CAPSULE | Freq: Three times a day (TID) | ORAL | 5 refills | Status: DC | PRN
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
## 2021-12-30 NOTE — Progress Notes (Signed)
Immunotherapy   Patient Details  Name: Tracy [name] MRN: [id number] Date of Birth: 05/19/1975  12/30/2021  [name] received a pneumovax 23 in office today.  [facility]vin- [id number] Lot # [id number] Exp- 10/24/2022 Consent signed and patient instructions given.   [name] 12/30/2021, 2:23 PM

## 2021-12-30 NOTE — Progress Notes (Signed)
FOLLOW UP  Date of Service/Encounter:  12/30/21   Assessment:   Moderate persistent asthma, uncomplicated    Perennial and seasonal allergic rhinitis (grasses, trees, indoor molds, dust mite, cat)   Recurrent infections    Eczema   GERD - better controlled   Hypertension - on amlodipine  Plan/Recommendations:   1. Moderate persistent asthma, uncomplicated - Lung testing looked much better today.  - We are going to change to Advair 250/50 one puff twice daily (this seemed to be a a better Tier in the system).  - Call us with an update on how much it is.  - Spacer sample and demonstration provided. - Daily controller medication(s): Advair 250/27mg one puff twice daily - Prior to physical activity: albuterol 2 puffs 10-15 minutes before physical activity. - Rescue medications: albuterol 4 puffs every 4-6 hours as needed - Asthma control goals:  * Full participation in all desired activities (may need albuterol before activity) * Albuterol use two time or less a week on average (not counting use with activity) * Cough interfering with sleep two time or less a month * Oral steroids no more than once a year * No hospitalizations  2. Perennial and seasonal allergic rhinitis (grasses, trees, indoor molds, dust mite, cat) - We are not going to make any changes at all.  - Continue with: Xyzal (levocetirizine) '5mg'$  tablet once daily and Singulair (montelukast) '10mg'$  daily - Continue with: Ryaltris (olopatadine/mometasone) two sprays per nostril 1-2 times daily as needed - The nose spray contains a nasal steroid AND a nasal antihistamine.  - You can use an extra dose of the antihistamine, if needed, for breakthrough symptoms.  - Consider nasal saline rinses 1-2 times daily to remove allergens from the nasal cavities as well as help with mucous clearance (this is especially helpful to do before the nasal sprays are given) - We could do allergy shots in the future if needed.  - This  is a curative process, but it does take some time.   3. Recurrent infections - We gave you a Pneumovax today. - We will get repeat titers in 4-6 weeks (you can get these here at the office).   4. GERD - Continue with omeprazole '40mg'$  once daily. - We can send in more Tessaon pearls in case you need them.   5. Return in about 3 months (around 03/31/2022).   Subjective:   RHARNEET NOBLETTis a 46y.o. female presenting today for follow up of  Chief Complaint  Patient presents with   Asthma   Follow-up    Tommye A PYarboughhas a history of the following: Patient Active Problem List   Diagnosis Date Noted   Moderate persistent asthma, uncomplicated 171/69/6789  Perennial allergic rhinitis 12/02/2021   Recurrent infections 12/02/2021   Flexural atopic dermatitis 12/02/2021   Gastroesophageal reflux disease 12/02/2021   Essential (primary) hypertension 12/09/2016    History obtained from: chart review and patient.  RLanetrais a 46y.o. female presenting for a follow up visit.  We last saw her in November 2023. She was having recurrent symptoms, therefore we started Breo one puff once daily. She had testing today that demonstrated positive to cat. We continued with levocetirizine and start Ryaltris two sprays BID PRN. We also did an immune workup due to her history of recurrent infections. We added on omeprazole for her GERD.   Her immune workup showed protection to 11 out of 23 serotypes of Streptococcus pneumonia.  We offered  a Pneumovax with repeat titers.  Since last visit, she has largely done well.   Asthma/Respiratory Symptom History: She has been using the [employer] and feels that is is working  well. She has not been using her rescue inhaler. SOB is much better controlled than it was previously. She has not been on prednisone and has not been in the ED For her symptoms. She feels very good today.   Allergic Rhinitis Symptom History: She is doing well with the nasal spray. It is allowing  her to breathe through her nose. She was not able to breathe through her nose last time. She has some postnasal drip with throat clearing in her upper throat. She got Tessalon pearls that worked well with that.   Skin Symptom History: Skin is well controlled with the current regimen. She has been using her emollients regularly. Skin has not been flaring since the last visit.    GERD Symptom History: She remains on the omeprazole. She is taking that nightly and she has had no problems with reflux.   Otherwise, there have been no changes to her past medical history, surgical history, family history, or social history.    Review of Systems  Constitutional: Negative.  Negative for chills, fever, malaise/fatigue and weight loss.  HENT:  Negative for congestion, ear discharge, ear pain and sinus pain.   Eyes:  Negative for pain, discharge and redness.  Respiratory:  Negative for cough, sputum production, shortness of breath and wheezing.   Cardiovascular: Negative.  Negative for chest pain and palpitations.  Gastrointestinal:  Negative for abdominal pain, constipation, diarrhea, heartburn, nausea and vomiting.  Skin: Negative.  Negative for itching and rash.  Neurological:  Negative for dizziness and headaches.  Endo/Heme/Allergies:  Positive for environmental allergies. Does not bruise/bleed easily.       Objective:   Blood pressure 122/74, pulse 72, temperature 98.1 F (36.7 C), temperature source Temporal, resp. rate 16, height '5\' 2"'$  (1.575 m), weight 168 lb 6.4 oz (76.4 kg), SpO2 98 %. Body mass index is 30.8 kg/m.    Physical Exam Vitals reviewed.  Constitutional:      Appearance: She is well-developed.  HENT:     Head: Normocephalic and atraumatic.     Right Ear: Tympanic membrane, ear canal and external ear normal. No drainage, swelling or tenderness. Tympanic membrane is not injected, scarred, erythematous, retracted or bulging.     Left Ear: Tympanic membrane, ear canal and  external ear normal. No drainage, swelling or tenderness. Tympanic membrane is not injected, scarred, erythematous, retracted or bulging.     Nose: No nasal deformity, septal deviation, mucosal edema or rhinorrhea.     Right Turbinates: Enlarged and swollen. Not pale.     Left Turbinates: Enlarged and swollen. Not pale.     Right Sinus: No maxillary sinus tenderness or frontal sinus tenderness.     Left Sinus: No maxillary sinus tenderness or frontal sinus tenderness.     Mouth/Throat:     Mouth: Mucous membranes are not pale and not dry.     Pharynx: Uvula midline.  Eyes:     General:        Right eye: No discharge.        Left eye: No discharge.     Conjunctiva/sclera: Conjunctivae normal.     Right eye: Right conjunctiva is not injected. No chemosis.    Left eye: Left conjunctiva is not injected. No chemosis.    Pupils: Pupils are equal, round, and reactive to  light.  Cardiovascular:     Rate and Rhythm: Normal rate and regular rhythm.     Heart sounds: Normal heart sounds.  Pulmonary:     Effort: Pulmonary effort is normal. No tachypnea, accessory muscle usage or respiratory distress.     Breath sounds: Normal breath sounds. No wheezing, rhonchi or rales.     Comments: Moving air well in all lung fields.  No increased work of breathing. Chest:     Chest wall: No tenderness.  Lymphadenopathy:     Head:     Right side of head: No submandibular, tonsillar or occipital adenopathy.     Left side of head: No submandibular, tonsillar or occipital adenopathy.     Cervical: No cervical adenopathy.  Skin:    General: Skin is warm.     Capillary Refill: Capillary refill takes less than 2 seconds.     Coloration: Skin is not pale.     Findings: No abrasion, erythema, petechiae or rash. Rash is not papular, urticarial or vesicular.     Comments: No eczematous or urticarial lesions noted.  She does have some excoriations present on the bilateral arms.  Neurological:     Mental Status: She  is alert.  Psychiatric:        Behavior: Behavior is cooperative.      Diagnostic studies:    Spirometry: results normal (FEV1: 2.35/108%, FVC: 2.76/102%, FEV1/FVC: 85%).    Spirometry consistent with normal pattern.   Allergy Studies: none        Salvatore Marvel, MD  [facility]

## 2021-12-30 NOTE — Patient Instructions (Addendum)
1. Moderate persistent asthma, uncomplicated - Lung testing looked much better today.  - We are going to change to Advair 250/50 one puff twice daily (this seemed to be a a better Tier in the system).  - Call us with an update on how much it is.  - Spacer sample and demonstration provided. - Daily controller medication(s): Advair 250/58mg one puff twice daily - Prior to physical activity: albuterol 2 puffs 10-15 minutes before physical activity. - Rescue medications: albuterol 4 puffs every 4-6 hours as needed - Asthma control goals:  * Full participation in all desired activities (may need albuterol before activity) * Albuterol use two time or less a week on average (not counting use with activity) * Cough interfering with sleep two time or less a month * Oral steroids no more than once a year * No hospitalizations  2. Perennial and seasonal allergic rhinitis (grasses, trees, indoor molds, dust mite, cat) - We are not going to make any changes at all.  - Continue with: Xyzal (levocetirizine) '5mg'$  tablet once daily and Singulair (montelukast) '10mg'$  daily - Continue with: Ryaltris (olopatadine/mometasone) two sprays per nostril 1-2 times daily as needed - The nose spray contains a nasal steroid AND a nasal antihistamine.  - You can use an extra dose of the antihistamine, if needed, for breakthrough symptoms.  - Consider nasal saline rinses 1-2 times daily to remove allergens from the nasal cavities as well as help with mucous clearance (this is especially helpful to do before the nasal sprays are given) - We could do allergy shots in the future if needed.  - This is a curative process, but it does take some time.   3. Recurrent infections - We gave you a Pneumovax today. - We will get repeat titers in 4-6 weeks (you can get these here at the office).   4. GERD - Continue with omeprazole '40mg'$  once daily. - We can send in more Tessaon pearls in case you need them.   5. Return in about 3  months (around 03/31/2022).    Please inform [place] any Emergency Department visits, hospitalizations, or changes in symptoms. Call [place]efore going to the ED for breathing or allergy symptoms since we might be able to fit you in for a sick visit. Feel free to contact u[place]anytime with any questions, problems, or concerns.  It was a pleasure to see you and your family again today!  Websites that have reliable patient information: 1. American Academy of Asthma, Allergy, and Immunology: www.aaaai.org 2. Food Allergy Research and Education (FARE): foodallergy.org 3. Mothers of Asthmatics: http://www.asthmacommunitynetwork.org 4. American College of Allergy, Asthma, and Immunology: www.acaai.org   COVID-19 Vaccine Information can be found at: [email or web address] questions related to vaccine distribution or appointments, please email [email or web address] or call [phone]   We realize that you might be concerned about having an allergic reaction to the COVID19 vaccines. To help with that concern, WE ARE OFFERING THE COVID19 VACCINES IN OUR OFFICE! Ask the front desk for dates!     "Like" [facility] Facebook and Instagram for our latest updates!      A healthy democracy works best when ANew York Life Insuranceparticipate! Make sure you are registered to vote! If you have moved or changed any of your contact information, you will need to get this updated before voting!  In some cases, you MAY be able to register to vote online: [email or web address]       Control of Dog or Cat Allergen  Avoidance is the best way to manage a dog or cat allergy. If you have a dog or cat and are allergic to dog or cats, consider removing the dog or cat from the home. If you have a dog or cat but don't want to find it a new home, or if your family wants a pet even though someone in the household is allergic, here are some strategies  that may help keep symptoms at bay:  Keep the pet out of your bedroom and restrict it to only a few rooms. Be advised that keeping the dog or cat in only one room will not limit the allergens to that room. Don't pet, hug or kiss the dog or cat; if you do, wash your hands with soap and water. High-efficiency particulate air (HEPA) cleaners run continuously in a bedroom or living room can reduce allergen levels over time. Regular use of a high-efficiency vacuum cleaner or a central vacuum can reduce allergen levels. Giving your dog or cat a bath at least once a week can reduce airborne allergen.

## 2021-12-31 ENCOUNTER — Ambulatory Visit: Payer: No Typology Code available for payment source | Admitting: Sports Medicine

## 2022-01-01 ENCOUNTER — Other Ambulatory Visit: Payer: Self-pay | Admitting: Emergency Medicine

## 2022-01-02 IMAGING — MG MM DIGITAL DIAGNOSTIC UNILAT*L* W/ TOMO W/ CAD
6 of 12 series · 6 of 36 positions shown · non-contrast
Comparison: 05/26/2020 baseline screening mammogram

CLINICAL DATA: 44-year-old female for further evaluation of LEFT
breast asymmetry on baseline screening mammogram.

EXAM:
DIGITAL DIAGNOSTIC UNILATERAL LEFT MAMMOGRAM WITH TOMOSYNTHESIS AND
CAD; ULTRASOUND LEFT BREAST LIMITED
TECHNIQUE: Left digital diagnostic mammography and breast tomosynthesis was
performed. The images were evaluated with computer-aided detection.;
Targeted ultrasound examination of the left breast was performed

[L MLO synth-2D (1 of 3)]
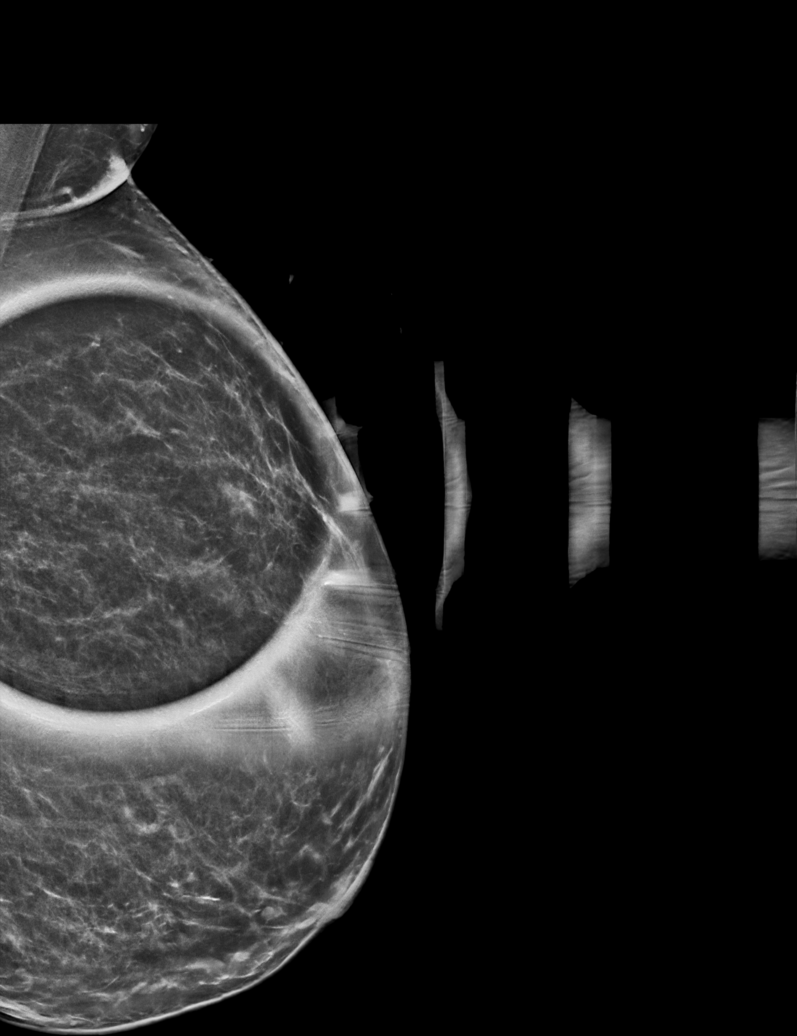

[L CC synth-2D (1 of 3)]
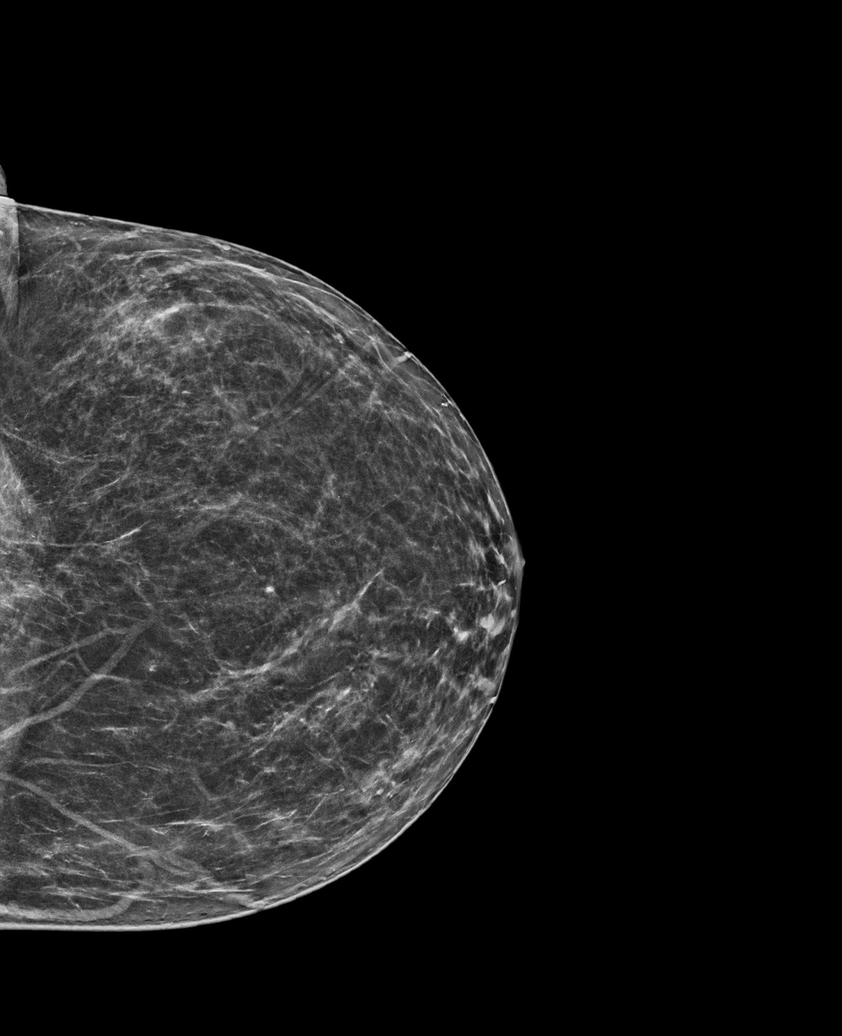

[L MLO synth-2D (2 of 3)]
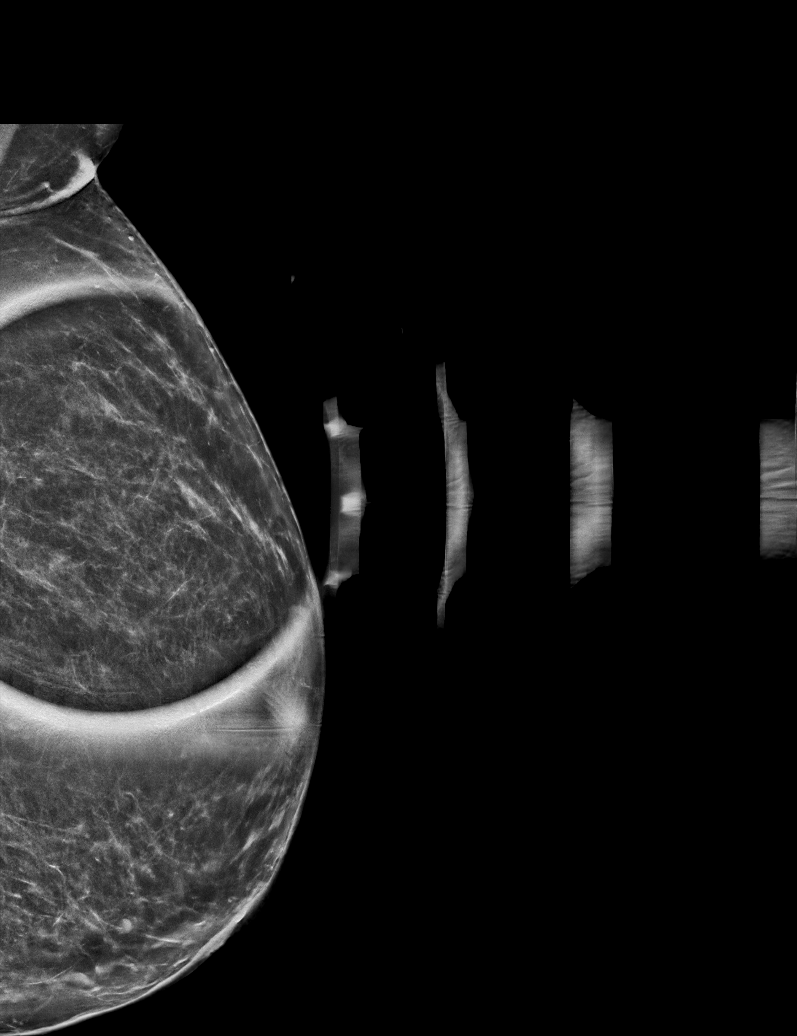

[L CC synth-2D (2 of 3)]
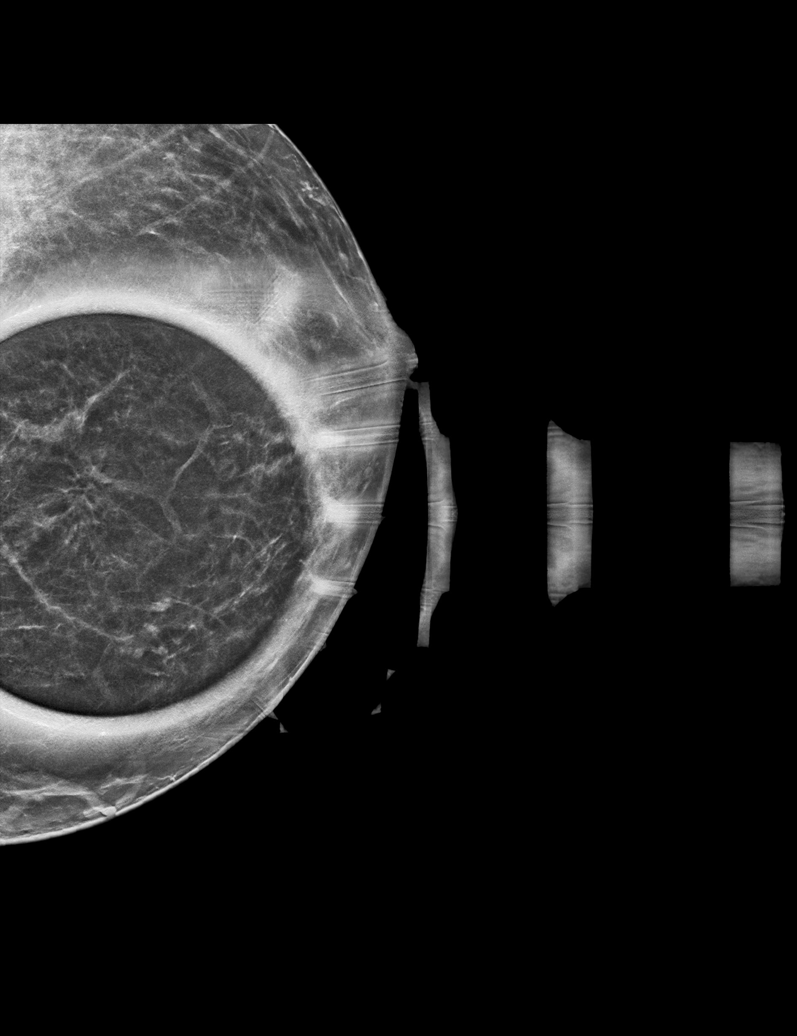

[L MLO synth-2D (3 of 3)]
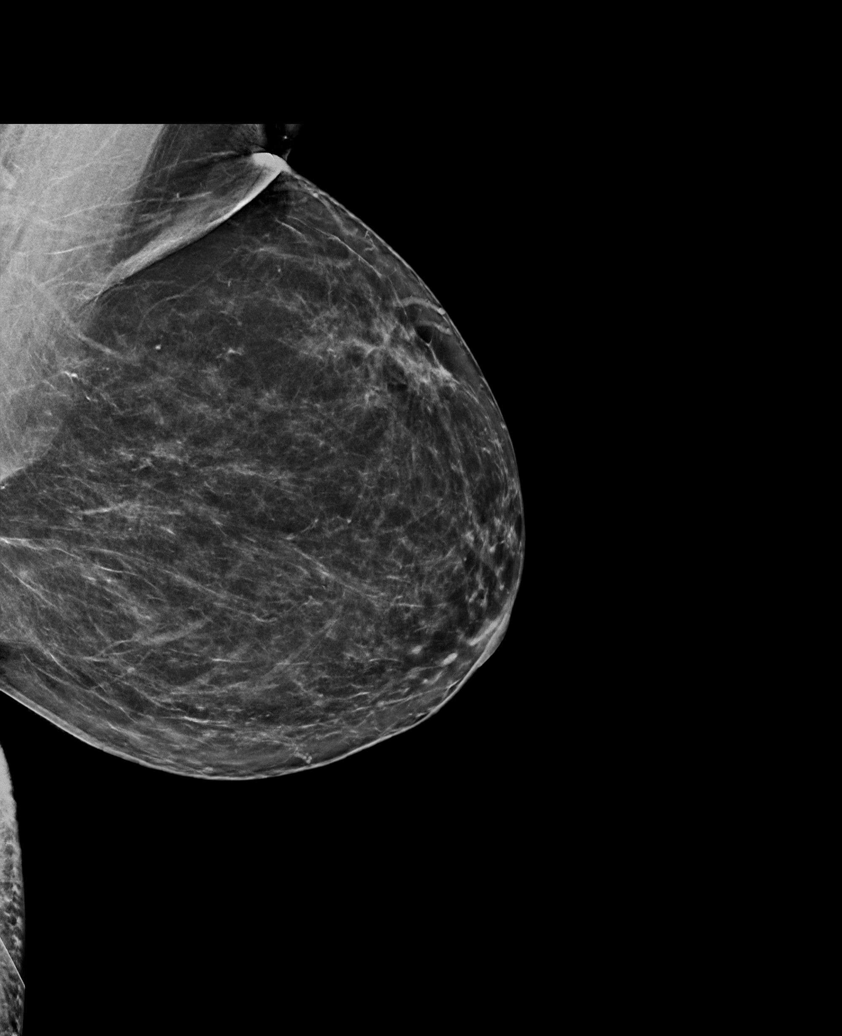

[L CC synth-2D (3 of 3)]
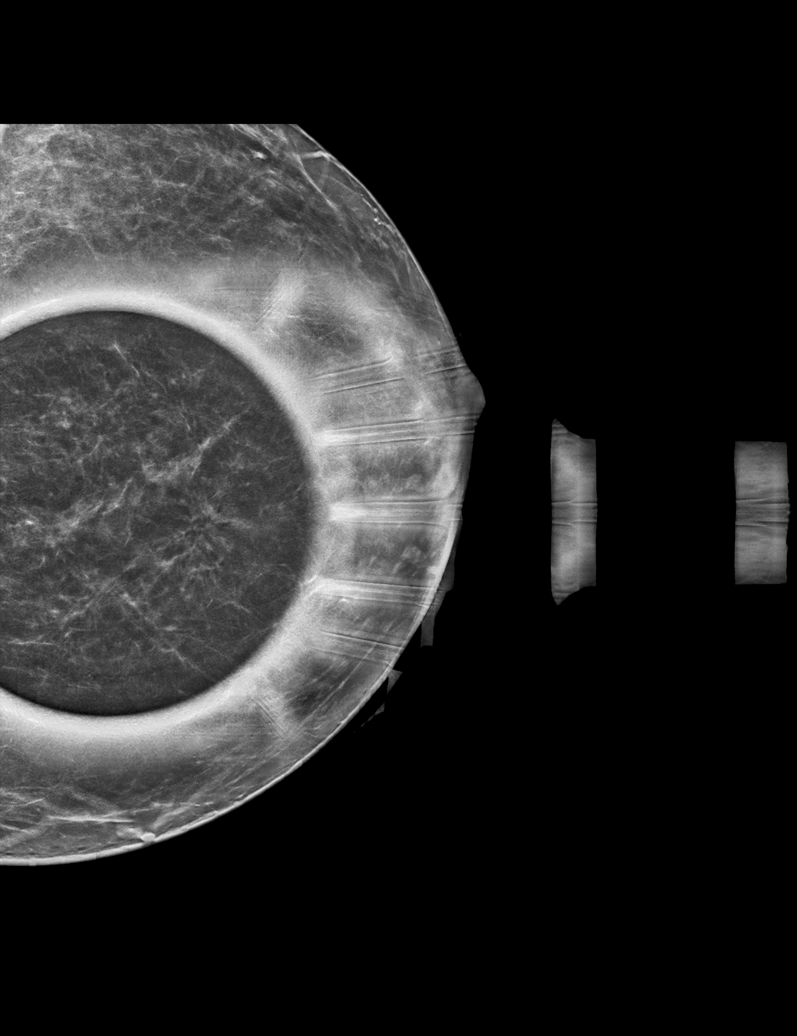

[6 of 36 positions shown; findings below may reference images not displayed]

ACR Breast Density Category b: There are scattered areas of
fibroglandular density.
FINDINGS: 2D/3D full field and spot compression views of the LEFT breast
demonstrate persistent distortion within the UPPER INNER LEFT
breast, middle depth, better visualized on the CC views.

Targeted ultrasound is performed, showing no sonographic correlate
to the LEFT breast mammographic distortion.

No abnormal LEFT axillary lymph nodes are noted.
IMPRESSION: 1. UPPER INNER LEFT breast distortion without sonographic correlate.
Tissue sampling is recommended.
2. No abnormal appearing LEFT axillary lymph nodes.

RECOMMENDATION:
3D/stereotactic guided LEFT breast biopsy, which will be scheduled.

I have discussed the findings and recommendations with the patient.
If applicable, a reminder letter will be sent to the patient
regarding the next appointment.

BI-RADS CATEGORY  4: Suspicious.

## 2022-01-20 ENCOUNTER — Other Ambulatory Visit: Payer: Self-pay | Admitting: Emergency Medicine

## 2022-01-20 ENCOUNTER — Encounter: Payer: Self-pay | Admitting: Emergency Medicine

## 2022-01-20 ENCOUNTER — Other Ambulatory Visit: Payer: Self-pay | Admitting: Obstetrics and Gynecology

## 2022-01-20 DIAGNOSIS — Z1231 Encounter for screening mammogram for malignant neoplasm of breast: Secondary | ICD-10-CM

## 2022-01-21 ENCOUNTER — Other Ambulatory Visit: Payer: Self-pay | Admitting: Obstetrics and Gynecology

## 2022-01-21 DIAGNOSIS — N644 Mastodynia: Secondary | ICD-10-CM

## 2022-02-01 NOTE — Therapy (Signed)
OUTPATIENT PHYSICAL THERAPY EVALUATION   Patient Name: Tracy [name] MRN: [id number] DOB:10-May-1975, 47 y.o., female Today's Date: 02/02/2022   END OF SESSION:  PT End of Session - 02/02/22 0901     Visit Number 1    Number of Visits 13    Date for PT Re-Evaluation 03/16/22    Authorization Type [employer]    PT Start Time [phone]    PT Stop Time 0930    PT Time Calculation (min) 41 min    Activity Tolerance Patient tolerated treatment well    Behavior During Therapy WFL for tasks assessed/performed             Past Medical History:  Diagnosis Date   Asthma    Breast lump    Eczema    Hypertension    Past Surgical History:  Procedure Laterality Date   BREAST LUMPECTOMY WITH RADIOACTIVE SEED LOCALIZATION Left 07/30/2020   Procedure: LEFT BREAST LUMPECTOMY WITH RADIOACTIVE SEED LOCALIZATION;  Surgeon: Erroll Luna, MD;  Location: [place];  Service: General;  Laterality: Left;   [place]  06/19/2008   [place] SURGERY  10/2010   Patient Active Problem List   Diagnosis Date Noted   Moderate persistent asthma, uncomplicated 66/44/0347   Perennial allergic rhinitis 12/02/2021   Recurrent infections 12/02/2021   Flexural atopic dermatitis 12/02/2021   Gastroesophageal reflux disease 12/02/2021   Essential (primary) hypertension 12/09/2016    PCP: Horald Pollen, MD  REFERRING PROVIDER: Glennon Mac, DO  REFERRING DIAG: Chronic right shoulder pain; Impingement syndrome of right shoulder  THERAPY DIAG:  Chronic right shoulder pain  Muscle weakness (generalized)  Rationale for Evaluation and Treatment: Rehabilitation  ONSET DATE: February of 2023   SUBJECTIVE:                       SUBJECTIVE STATEMENT: Patient reports she hurt her shoulder when her dad fell and she tried to pick him up. Shoulder pain gradually got worse and she had a lot of  difficulty to reach behind or overhead. She also has pain when she lies on the right side and the arm will go to sleep. She did have an injection in the right shoulder with a little benefit, but the pain is starting to come back. Patient denies any clicking or popping, starts out of nowhere she can have some numbness and tingling in her fingers. When pain occurs it is like lightning and then pain will slowly subside but can linger. She reports some pain in the neck at night, she sleeps with 2 pillows.  Patient is left handed  PERTINENT HISTORY: None  PAIN:  Are you having pain? Yes:  NPRS scale: 2/10 (7/10 with shoulder movement / activity) Pain location: Right shoulder Pain description: Intermittent, sharp, lightning Aggravating factors: Shoulder movement, reaching behind, overhead Relieving factors: Rest  PRECAUTIONS: None  WEIGHT BEARING RESTRICTIONS: No  FALLS:  Has patient fallen in last 6 months? No  PLOF: Independent  PATIENT GOALS: Pain relief and improve shoulder motion   OBJECTIVE:  PATIENT SURVEYS:  FOTO 58% functional status  COGNITION: Overall cognitive status: Within functional limits for tasks assessed     SENSATION: WFL  POSTURE: Rounded shoulders and forward head  CERVICAL ROM:   Active ROM A/PROM (deg) eval  Flexion WFL  Extension WFL  Right lateral flexion WFL  Left lateral flexion  WFL  Right rotation WFL  Left rotation [facility]   Patient does report right shoulder pain with cervical right side bend  UPPER EXTREMITY ROM:   Active ROM Right eval Left eval  Shoulder flexion 135 150  Shoulder extension    Shoulder abduction    Shoulder internal rotation Reach to L1 Reach to T7  Shoulder external rotation 70 Reach to C7 70 Reach to T4  (Blank rows = not tested)  UPPER EXTREMITY MMT:  MMT Right eval Left eval  Shoulder flexion 4 5  Shoulder extension    Shoulder abduction 4 5  Shoulder internal rotation 5 5  Shoulder external  rotation 4 5  Middle trapezius    Lower trapezius    Elbow flexion 5 5  Elbow extension 5 5  Grip strength (lbs)    (Blank rows = not tested)  SHOULDER SPECIAL TESTS: Impingement tests: Hawkins/Kennedy impingement test: positive   JOINT MOBILITY TESTING:  Grossly WFL  PALPATION:  Tender right upper trap, mid trap and rhomboid region, infraspinatus; referral to right anterior shoulder with mid trap palpation    TODAY'S TREATMENT:      [facility] Adult PT Treatment:                                                DATE: 02/02/2022 Therapeutic Exercise: Seated table slide shoulder flexion x 10 SMFR using tennis ball for upper trap and posterior shoulder region Double ER and scap retraction with green x 10 Row with green x 10  PATIENT EDUCATION: Education details: Exam findings, POC, HEP, dry needling Person educated: Patient Education method: Explanation, Demonstration, Tactile cues, Verbal cues, and Handouts Education comprehension: verbalized understanding, returned demonstration, verbal cues required, tactile cues required, and needs further education  HOME EXERCISE PROGRAM: Access Code: [email or web address]    ASSESSMENT: CLINICAL IMPRESSION: Patient is a 47 y.o. female who was seen today for physical therapy evaluation and treatment for chronic right shoulder pain. Her symptoms seem most consistent with rotator cuff related pain with possible cervical/muscular referral. She exhibits limitations with right shoulder motion and strength, with pain upon testing. Cervical AROM grossly WFL but she does report some right shoulder pain with cervical motion.   OBJECTIVE IMPAIRMENTS: decreased activity tolerance, decreased ROM, decreased strength, postural dysfunction, and pain.   ACTIVITY LIMITATIONS: carrying, lifting, sleeping, dressing, reach over head, and hygiene/grooming  PARTICIPATION LIMITATIONS: meal prep, cleaning, laundry, shopping, and occupation  PERSONAL FACTORS: Fitness, Past/current  experiences, and Time since onset of injury/illness/exacerbation are also affecting patient's functional outcome.   REHAB POTENTIAL: Good  CLINICAL DECISION MAKING: Stable/uncomplicated  EVALUATION COMPLEXITY: Low   GOALS: Goals reviewed with patient? Yes  SHORT TERM GOALS: Target date: 02/23/2022  Patient will be I with initial HEP in order to progress with therapy. Baseline: HEP provided at eval Goal status: INITIAL  2.  PT will review FOTO with patient by 3rd visit in order to understand expected progress and outcome with therapy. Baseline: FOTO assessed at eval Goal status: INITIAL  3.  Patient will report right shoulder pain </= 4/10 with shoulder movement and activity to reduce functional limitations Baseline:  Goal status: INITIAL  LONG TERM GOALS: Target date: 03/16/2022  Patient will be I with final HEP to maintain progress from PT. Baseline: HEP provided at eval Goal status: INITIAL  2.  Patient will report >/= 68% status  on FOTO to indicate improved functional ability. Baseline: 58% functional status Goal status: INITIAL  3.  Patient will demonstrate right shoulder elevation >/= 150 deg to improve overhead reach and completing grooming tasks Baseline: 135 deg Goal status: INITIAL  4.  Patient will demonstrate right shoulder strength >/= 5/5 MMT in order to improve lifting and performing household tasks with right arm Baseline: strength deficits noted above Goal status: INITIAL   PLAN: PT FREQUENCY: 1-2x/week  PT DURATION: 6 weeks  PLANNED INTERVENTIONS: Therapeutic exercises, Therapeutic activity, Neuromuscular re-education, Balance training, Gait training, Patient/Family education, Self Care, Joint mobilization, Joint manipulation, Aquatic Therapy, Dry Needling, Spinal manipulation, Spinal mobilization, Cryotherapy, Moist heat, Taping, Ionotophoresis '4mg'$ /ml Dexamethasone, Manual therapy, and Re-evaluation  PLAN FOR NEXT SESSION: Review HEP and progress  PRN, manual / dry needling for upper trap/infra/mid trap/rhomboid, right shoulder stretching and mobilizations, progress rotator cuff and postural strengthening   Hilda Blades, PT, DPT, LAT, ATC 02/02/22  11:25 AM Phone: [phone] Fax: [phone]

## 2022-02-02 ENCOUNTER — Encounter: Payer: Self-pay | Admitting: Physical Therapy

## 2022-02-02 ENCOUNTER — Ambulatory Visit: Payer: No Typology Code available for payment source | Attending: Sports Medicine | Admitting: Physical Therapy

## 2022-02-02 ENCOUNTER — Other Ambulatory Visit: Payer: Self-pay

## 2022-02-02 DIAGNOSIS — M6281 Muscle weakness (generalized): Secondary | ICD-10-CM | POA: Diagnosis present

## 2022-02-02 DIAGNOSIS — G8929 Other chronic pain: Secondary | ICD-10-CM | POA: Insufficient documentation

## 2022-02-02 DIAGNOSIS — M25511 Pain in right shoulder: Secondary | ICD-10-CM | POA: Insufficient documentation

## 2022-02-02 DIAGNOSIS — M7541 Impingement syndrome of right shoulder: Secondary | ICD-10-CM | POA: Diagnosis not present

## 2022-02-02 NOTE — Patient Instructions (Signed)
Access Code: [facility] URL: [email or web address] Date: 02/02/2022 Prepared by: Hilda Blades  Exercises - Seated Shoulder Flexion Towel Slide at Table Top  - 2-3 x daily - 10 reps - 5 seconds  hold - Standing Upper Trapezius Mobilization with Small Ball  - Shoulder External Rotation and Scapular Retraction with Resistance  - 1 x daily - 2-3 sets - 10 reps - Standing Row with Anchored Resistance  - 1 x daily - 2-3 sets - 15 reps

## 2022-02-03 ENCOUNTER — Ambulatory Visit: Payer: No Typology Code available for payment source

## 2022-02-03 ENCOUNTER — Ambulatory Visit
Admission: RE | Admit: 2022-02-03 | Discharge: 2022-02-03 | Disposition: A | Discharge status: Home / Self Care | Payer: No Typology Code available for payment source | Source: Ambulatory Visit | Attending: Obstetrics and Gynecology | Admitting: Obstetrics and Gynecology

## 2022-02-03 ENCOUNTER — Telehealth: Payer: Self-pay | Admitting: Allergy & Immunology

## 2022-02-03 DIAGNOSIS — B999 Unspecified infectious disease: Secondary | ICD-10-CM

## 2022-02-03 DIAGNOSIS — N644 Mastodynia: Secondary | ICD-10-CM

## 2022-02-03 NOTE — Telephone Encounter (Signed)
Labs ordered for repeat Pneumo titers.   Salvatore Marvel, MD [facility]

## 2022-02-08 NOTE — Therapy (Signed)
OUTPATIENT PHYSICAL THERAPY TREATMENT NOTE   Patient Name: Tracy [name] MRN: [id number] DOB:02/08/1975, 47 y.o., female Today's Date: 02/09/2022  PCP: Horald Pollen, MD REFERRING PROVIDER: Glennon Mac, DO   END OF SESSION:   PT End of Session - 02/09/22 0853     Visit Number 2    Number of Visits 13    Date for PT Re-Evaluation 03/16/22    Authorization Type [employer]    PT Start Time [phone]    PT Stop Time 0930    PT Time Calculation (min) 44 min    Activity Tolerance Patient tolerated treatment well    Behavior During Therapy [facility] for tasks assessed/performed             Past Medical History:  Diagnosis Date   Asthma    Breast lump    Eczema    Hypertension    Past Surgical History:  Procedure Laterality Date   BREAST LUMPECTOMY WITH RADIOACTIVE SEED LOCALIZATION Left 07/30/2020   Procedure: LEFT BREAST LUMPECTOMY WITH RADIOACTIVE SEED LOCALIZATION;  Surgeon: Erroll Luna, MD;  Location: [place];  Service: General;  Laterality: Left;   [place]  06/19/2008   [place] SURGERY  10/2010   Patient Active Problem List   Diagnosis Date Noted   Moderate persistent asthma, uncomplicated 25/05/3974   Perennial allergic rhinitis 12/02/2021   Recurrent infections 12/02/2021   Flexural atopic dermatitis 12/02/2021   Gastroesophageal reflux disease 12/02/2021   Essential (primary) hypertension 12/09/2016    REFERRING DIAG: Chronic right shoulder pain; Impingement syndrome of right shoulder   THERAPY DIAG:  Chronic right shoulder pain  Muscle weakness (generalized)  Rationale for Evaluation and Treatment Rehabilitation  PERTINENT HISTORY: None   PRECAUTIONS: None    SUBJECTIVE:            SUBJECTIVE STATEMENT:  Patient reports neck and shoulder area still painful. She has been consistent with her exercises.   PAIN:  Are you having pain?  Yes:  NPRS scale: 2/10 (7/10 with shoulder movement / activity) Pain location: Right shoulder Pain description: Intermittent, sharp, lightning Aggravating factors: Shoulder movement, reaching behind, overhead Relieving factors: Rest   OBJECTIVE: (objective measures completed at initial evaluation unless otherwise dated) PATIENT SURVEYS:  FOTO 58% functional status   POSTURE: Rounded shoulders and forward head   CERVICAL ROM:    Active ROM A/PROM (deg) eval  Flexion WFL  Extension WFL  Right lateral flexion WFL  Left lateral flexion WFL  Right rotation WFL  Left rotation [facility]    Patient does report right shoulder pain with cervical right side bend   UPPER EXTREMITY ROM:    Active ROM Right eval Left eval  Shoulder flexion 135 150  Shoulder extension      Shoulder abduction      Shoulder internal rotation Reach to L1 Reach to T7  Shoulder external rotation 70 Reach to C7 70 Reach to T4  (Blank rows = not tested)   UPPER EXTREMITY MMT:   MMT Right eval Left eval  Shoulder flexion 4 5  Shoulder extension      Shoulder abduction 4 5  Shoulder internal rotation 5 5  Shoulder external rotation 4 5  Middle trapezius      Lower trapezius      Elbow flexion 5 5  Elbow extension 5 5  Grip strength (lbs)      (  Blank rows = not tested)   SHOULDER SPECIAL TESTS: Impingement tests: Hawkins/Kennedy impingement test: positive   PALPATION:  Tender right upper trap, mid trap and rhomboid region, infraspinatus; referral to right anterior shoulder with mid trap palpation               TODAY'S TREATMENT:      [facility] Adult PT Treatment:                                                DATE: 02/09/2022 Therapeutic Exercise: UBE L1 x 4 min (2 fwd/bwd) while taking subjective Sidelying thoracic rotation x 10 each Thoracic extension over FR at various levels Quadruped thoracic extension and shoulder stretch 5 x 5 sec Cat cow x 5 Quadruped on forearms thoracic rotation hand  behind head x 5 each\ Double ER and scap retraction with green 2 x 10 Manual: Skilled palpation and monitoring of muscle tension while performing TPDN STM right upper trap, infraspinatus, mid tap and rhomboid Trigger Point Dry Needling Treatment: Pre-treatment instruction: Patient instructed on dry needling rationale, procedures, and possible side effects including pain during treatment (achy,cramping feeling), bruising, drop of blood, lightheadedness, nausea, sweating. Patient Consent Given: Yes Education handout provided: No Muscles treated: Right upper trap, infraspinatus, mid trap/rhomboid  Needle size and number: .30x52m x 5 and .30x329mx 2 Electrical stimulation performed: No Parameters: N/A Treatment response/outcome: Twitch response elicited and Palpable decrease in muscle tension Post-treatment instructions: Patient instructed to expect possible mild to moderate muscle soreness later today and/or tomorrow. Patient instructed in methods to reduce muscle soreness and to continue prescribed HEP. If patient was dry needled over the lung field, patient was instructed on signs and symptoms of pneumothorax and, however unlikely, to see immediate medical attention should they occur. Patient was also educated on signs and symptoms of infection and to seek medical attention should they occur. Patient verbalized understanding of these instructions and education.   [facility] PT Treatment:                                                DATE: 02/02/2022 Therapeutic Exercise: Seated table slide shoulder flexion x 10 SMFR using tennis ball for upper trap and posterior shoulder region Double ER and scap retraction with green x 10 Row with green x 10   PATIENT EDUCATION: Education details: HEP update, TPDN Person educated: Patient Education method: Explanation, Demonstration, Tactile cues, Verbal cues, and Handouts Education comprehension: verbalized understanding, returned demonstration, verbal  cues required, tactile cues required, and needs further education   HOME EXERCISE PROGRAM: Access Code: [email or web address]      ASSESSMENT: CLINICAL IMPRESSION: Patient tolerated therapy well with no adverse effects. Performed TPDN treatment this visit for right neck and posterior shoulder region, multiple twitch responses and patient reporting good therapeutic benefit. Therapy focused primarily on improving thoracic mobility and shoulder motion with good tolerance. Continued with postural strengthening. She does require cueing for proper exercise technique. Updated HEP to progress spinal mobility exercises and she would benefit from continued skilled PT to progress her mobility and strength in order to reduce pain and maximize functional ability.    OBJECTIVE IMPAIRMENTS: decreased activity tolerance, decreased ROM, decreased strength, postural dysfunction, and pain.  ACTIVITY LIMITATIONS: carrying, lifting, sleeping, dressing, reach over head, and hygiene/grooming   PARTICIPATION LIMITATIONS: meal prep, cleaning, laundry, shopping, and occupation   PERSONAL FACTORS: Fitness, Past/current experiences, and Time since onset of injury/illness/exacerbation are also affecting patient's functional outcome.      GOALS: Goals reviewed with patient? Yes   SHORT TERM GOALS: Target date: 02/23/2022   Patient will be I with initial HEP in order to progress with therapy. Baseline: HEP provided at eval Goal status: INITIAL   2.  PT will review FOTO with patient by 3rd visit in order to understand expected progress and outcome with therapy. Baseline: FOTO assessed at eval Goal status: INITIAL   3.  Patient will report right shoulder pain </= 4/10 with shoulder movement and activity to reduce functional limitations Baseline:  Goal status: INITIAL   LONG TERM GOALS: Target date: 03/16/2022   Patient will be I with final HEP to maintain progress from PT. Baseline: HEP provided at eval Goal status:  INITIAL   2.  Patient will report >/= 68% status on FOTO to indicate improved functional ability. Baseline: 58% functional status Goal status: INITIAL   3.  Patient will demonstrate right shoulder elevation >/= 150 deg to improve overhead reach and completing grooming tasks Baseline: 135 deg Goal status: INITIAL   4.  Patient will demonstrate right shoulder strength >/= 5/5 MMT in order to improve lifting and performing household tasks with right arm Baseline: strength deficits noted above Goal status: INITIAL     PLAN: PT FREQUENCY: 1-2x/week   PT DURATION: 6 weeks   PLANNED INTERVENTIONS: Therapeutic exercises, Therapeutic activity, Neuromuscular re-education, Balance training, Gait training, Patient/Family education, Self Care, Joint mobilization, Joint manipulation, Aquatic Therapy, Dry Needling, Spinal manipulation, Spinal mobilization, Cryotherapy, Moist heat, Taping, Ionotophoresis '4mg'$ /ml Dexamethasone, Manual therapy, and Re-evaluation   PLAN FOR NEXT SESSION: Review HEP and progress PRN, manual / dry needling for upper trap/infra/mid trap/rhomboid, right shoulder stretching and mobilizations, progress rotator cuff and postural strengthening   Hilda Blades, PT, DPT, LAT, ATC 02/09/22  9:46 AM Phone: [phone] Fax: [phone]

## 2022-02-09 ENCOUNTER — Ambulatory Visit: Payer: No Typology Code available for payment source | Admitting: Physical Therapy

## 2022-02-09 ENCOUNTER — Encounter: Payer: Self-pay | Admitting: Physical Therapy

## 2022-02-09 ENCOUNTER — Other Ambulatory Visit: Payer: Self-pay

## 2022-02-09 DIAGNOSIS — M25511 Pain in right shoulder: Secondary | ICD-10-CM | POA: Diagnosis not present

## 2022-02-09 DIAGNOSIS — G8929 Other chronic pain: Secondary | ICD-10-CM

## 2022-02-09 DIAGNOSIS — M6281 Muscle weakness (generalized): Secondary | ICD-10-CM

## 2022-02-09 NOTE — Patient Instructions (Signed)
Access Code: [facility] URL: [email or web address] Date: 02/09/2022 Prepared by: Hilda Blades  Exercises - Sidelying Thoracic Lumbar Rotation  - 1-2 x daily - 10 reps - 5 seconds hold - Quadruped Thoracic Spine Extension  - 1-2 x daily - 10 reps - 5 seconds hold - Seated Shoulder Flexion Towel Slide at Table Top  - 2-3 x daily - 10 reps - 5 seconds  hold - Standing Upper Trapezius Mobilization with Small Ball  - Shoulder External Rotation and Scapular Retraction with Resistance  - 1 x daily - 2-3 sets - 10 reps - Standing Row with Anchored Resistance  - 1 x daily - 2-3 sets - 15 reps

## 2022-02-10 NOTE — Therapy (Signed)
OUTPATIENT PHYSICAL THERAPY TREATMENT NOTE   Patient Name: Tracy [name] MRN: [id number] DOB:05/09/1975, 47 y.o., female Today's Date: 02/11/2022  PCP: Horald Pollen, MD REFERRING PROVIDER: Glennon Mac, DO   END OF SESSION:   PT End of Session - 02/11/22 0911     Visit Number 3    Number of Visits 13    Date for PT Re-Evaluation 03/16/22    Authorization Type [employer]    PT Start Time [phone]    PT Stop Time 0928    PT Time Calculation (min) 42 min    Activity Tolerance Patient tolerated treatment well    Behavior During Therapy [facility] for tasks assessed/performed              Past Medical History:  Diagnosis Date   Asthma    Breast lump    Eczema    Hypertension    Past Surgical History:  Procedure Laterality Date   BREAST LUMPECTOMY WITH RADIOACTIVE SEED LOCALIZATION Left 07/30/2020   Procedure: LEFT BREAST LUMPECTOMY WITH RADIOACTIVE SEED LOCALIZATION;  Surgeon: Erroll Luna, MD;  Location: [place];  Service: General;  Laterality: Left;   [place]  06/19/2008   [place] SURGERY  10/2010   Patient Active Problem List   Diagnosis Date Noted   Moderate persistent asthma, uncomplicated 29/79/8921   Perennial allergic rhinitis 12/02/2021   Recurrent infections 12/02/2021   Flexural atopic dermatitis 12/02/2021   Gastroesophageal reflux disease 12/02/2021   Essential (primary) hypertension 12/09/2016    REFERRING DIAG: Chronic right shoulder pain; Impingement syndrome of right shoulder   THERAPY DIAG:  Chronic right shoulder pain  Muscle weakness (generalized)  Rationale for Evaluation and Treatment Rehabilitation  PERTINENT HISTORY: None   PRECAUTIONS: None    SUBJECTIVE:            SUBJECTIVE STATEMENT:  Patient reports neck and shoulder area still painful. She has been consistent with her exercises.   PAIN:  Are you having pain?  Yes:  NPRS scale: 2/10 (7/10 with shoulder movement / activity) Pain location: Right shoulder Pain description: Intermittent, sharp, lightning Aggravating factors: Shoulder movement, reaching behind, overhead Relieving factors: Rest   OBJECTIVE: (objective measures completed at initial evaluation unless otherwise dated) PATIENT SURVEYS:  FOTO 58% functional status   POSTURE: Rounded shoulders and forward head   CERVICAL ROM:    Active ROM A/PROM (deg) eval  Flexion WFL  Extension WFL  Right lateral flexion WFL  Left lateral flexion WFL  Right rotation [facility]  Left rotation [facility]    Patient does report right shoulder pain with cervical right side bend   UPPER EXTREMITY ROM:    Active ROM Right eval Left eval Right 02/11/22  Shoulder flexion 135 150 145  Shoulder extension       Shoulder abduction       Shoulder internal rotation Reach to L1 Reach to T7 T10  Shoulder external rotation 70 Reach to C7 70 Reach to T4   (Blank rows = not tested)   UPPER EXTREMITY MMT:   MMT Right eval Left eval  Shoulder flexion 4 5  Shoulder extension      Shoulder abduction 4 5  Shoulder internal rotation 5 5  Shoulder external rotation 4 5  Middle trapezius      Lower trapezius      Elbow flexion 5 5  Elbow extension 5 5  Grip strength (lbs)      (Blank rows = not tested)   SHOULDER SPECIAL TESTS: Impingement tests: Hawkins/Kennedy impingement test: positive   PALPATION:  Tender right upper trap, mid trap and rhomboid region, infraspinatus; referral to right anterior shoulder with mid trap palpation               TODAY'S TREATMENT:      [facility] Adult PT Treatment:                                                DATE: 02/11/2022 Therapeutic Exercise: Supine dowel flexion with 2# 2 x 10 Supine horizontal abduction with green 2 x 10 Supine serratus punch with 5# x 10, 7# x 10 Sidelying thoracic rotation x 10 each Sidelying shoulder ER with 4# 2 x 10 Sidelying flexion with 4#  2 x 10 Sidelying abduction with 4# 2 x 10 Quadruped thoracic extension and shoulder stretch 5 x 5 sec Cat cow x 5 Prone I with 2# 2 x 10   [facility] Adult PT Treatment:                                                DATE: 02/09/2022 Therapeutic Exercise: UBE L1 x 4 min (2 fwd/bwd) while taking subjective Sidelying thoracic rotation x 10 each Thoracic extension over FR at various levels Quadruped thoracic extension and shoulder stretch 5 x 5 sec Cat cow x 5 Quadruped on forearms thoracic rotation hand behind head x 5 each\ Double ER and scap retraction with green 2 x 10 Manual: Skilled palpation and monitoring of muscle tension while performing TPDN STM right upper trap, infraspinatus, mid tap and rhomboid Trigger Point Dry Needling Treatment: Pre-treatment instruction: Patient instructed on dry needling rationale, procedures, and possible side effects including pain during treatment (achy,cramping feeling), bruising, drop of blood, lightheadedness, nausea, sweating. Patient Consent Given: Yes Education handout provided: No Muscles treated: Right upper trap, infraspinatus, mid trap/rhomboid  Needle size and number: .30x71m x 5 and .30x315mx 2 Electrical stimulation performed: No Parameters: N/A Treatment response/outcome: Twitch response elicited and Palpable decrease in muscle tension Post-treatment instructions: Patient instructed to expect possible mild to moderate muscle soreness later today and/or tomorrow. Patient instructed in methods to reduce muscle soreness and to continue prescribed HEP. If patient was dry needled over the lung field, patient was instructed on signs and symptoms of pneumothorax and, however unlikely, to see immediate medical attention should they occur. Patient was also educated on signs and symptoms of infection and to seek medical attention should they occur. Patient verbalized understanding of these instructions and education.  [facility] PT Treatment:                                                 DATE: 02/02/2022 Therapeutic Exercise: Seated table slide shoulder flexion x 10 SMFR using tennis ball for upper trap and posterior shoulder region Double ER and scap retraction with green x 10 Row with green x 10   PATIENT EDUCATION: Education details: HEP update Person educated: Patient Education method: Explanation, Demonstration, Tactile cues,  Verbal cues, and Handouts Education comprehension: verbalized understanding, returned demonstration, verbal cues required, tactile cues required, and needs further education   HOME EXERCISE PROGRAM: Access Code: [email or web address]      ASSESSMENT: CLINICAL IMPRESSION: Patient tolerated therapy well with no adverse effects. She demonstrates continued improvement in her right shoulder motion this visit. Therapy continues to focus on progressing thoracic mobility and primarily progressing rotator cuff and periscapular strengthening with good tolerance. Updated HEP to progress her shoulder strength and control. She would benefit from continued skilled PT to progress her mobility and strength in order to reduce pain and maximize functional ability.    OBJECTIVE IMPAIRMENTS: decreased activity tolerance, decreased ROM, decreased strength, postural dysfunction, and pain.    ACTIVITY LIMITATIONS: carrying, lifting, sleeping, dressing, reach over head, and hygiene/grooming   PARTICIPATION LIMITATIONS: meal prep, cleaning, laundry, shopping, and occupation   PERSONAL FACTORS: Fitness, Past/current experiences, and Time since onset of injury/illness/exacerbation are also affecting patient's functional outcome.      GOALS: Goals reviewed with patient? Yes   SHORT TERM GOALS: Target date: 02/23/2022   Patient will be I with initial HEP in order to progress with therapy. Baseline: HEP provided at eval Goal status: INITIAL   2.  PT will review FOTO with patient by 3rd visit in order to understand expected progress and  outcome with therapy. Baseline: FOTO assessed at eval Goal status: INITIAL   3.  Patient will report right shoulder pain </= 4/10 with shoulder movement and activity to reduce functional limitations Baseline:  Goal status: INITIAL   LONG TERM GOALS: Target date: 03/16/2022   Patient will be I with final HEP to maintain progress from PT. Baseline: HEP provided at eval Goal status: INITIAL   2.  Patient will report >/= 68% status on FOTO to indicate improved functional ability. Baseline: 58% functional status Goal status: INITIAL   3.  Patient will demonstrate right shoulder elevation >/= 150 deg to improve overhead reach and completing grooming tasks Baseline: 135 deg Goal status: INITIAL   4.  Patient will demonstrate right shoulder strength >/= 5/5 MMT in order to improve lifting and performing household tasks with right arm Baseline: strength deficits noted above Goal status: INITIAL     PLAN: PT FREQUENCY: 1-2x/week   PT DURATION: 6 weeks   PLANNED INTERVENTIONS: Therapeutic exercises, Therapeutic activity, Neuromuscular re-education, Balance training, Gait training, Patient/Family education, Self Care, Joint mobilization, Joint manipulation, Aquatic Therapy, Dry Needling, Spinal manipulation, Spinal mobilization, Cryotherapy, Moist heat, Taping, Ionotophoresis '4mg'$ /ml Dexamethasone, Manual therapy, and Re-evaluation   PLAN FOR NEXT SESSION: Review HEP and progress PRN, manual / dry needling for upper trap/infra/mid trap/rhomboid, right shoulder stretching and mobilizations, progress rotator cuff and postural strengthening   Hilda Blades, PT, DPT, LAT, ATC 02/11/22  9:55 AM Phone: [phone] Fax: [phone]

## 2022-02-11 ENCOUNTER — Ambulatory Visit: Payer: No Typology Code available for payment source | Admitting: Physical Therapy

## 2022-02-11 ENCOUNTER — Encounter: Payer: Self-pay | Admitting: Physical Therapy

## 2022-02-11 DIAGNOSIS — M6281 Muscle weakness (generalized): Secondary | ICD-10-CM

## 2022-02-11 DIAGNOSIS — M25511 Pain in right shoulder: Secondary | ICD-10-CM | POA: Diagnosis not present

## 2022-02-11 DIAGNOSIS — G8929 Other chronic pain: Secondary | ICD-10-CM

## 2022-02-11 LAB — STREP PNEUMONIAE 23 SEROTYPES IGG
Pneumo Ab Type 1*: >14.2 ug/mL (ref >1.3)
Pneumo Ab Type 12 (12F)*: 0.8 ug/mL — ABNORMAL LOW (ref >1.3)
Pneumo Ab Type 14*: >18.7 ug/mL (ref >1.3)
Pneumo Ab Type 17 (17F)*: 19.2 ug/mL (ref >1.3)
Pneumo Ab Type 19 (19F)*: 13.2 ug/mL (ref >1.3)
Pneumo Ab Type 2*: >20.7 ug/mL (ref >1.3)
Pneumo Ab Type 20*: 14.1 ug/mL (ref >1.3)
Pneumo Ab Type 22 (22F)*: 10.6 ug/mL (ref >1.3)
Pneumo Ab Type 23 (23F)*: >14.4 ug/mL (ref >1.3)
Pneumo Ab Type 26 (6B)*: >41.4 ug/mL (ref >1.3)
Pneumo Ab Type 3*: 8.4 ug/mL (ref >1.3)
Pneumo Ab Type 34 (10A)*: >19.5 ug/mL (ref >1.3)
Pneumo Ab Type 4*: 3 ug/mL (ref >1.3)
Pneumo Ab Type 43 (11A)*: >7.6 ug/mL (ref >1.3)
Pneumo Ab Type 5*: >47.3 ug/mL (ref >1.3)
Pneumo Ab Type 51 (7F)*: 8.7 ug/mL (ref >1.3)
Pneumo Ab Type 54 (15B)*: >22 ug/mL (ref >1.3)
Pneumo Ab Type 56 (18C)*: 6.3 ug/mL (ref >1.3)
Pneumo Ab Type 57 (19A)*: 8.4 ug/mL (ref >1.3)
Pneumo Ab Type 68 (9V)*: >13.4 ug/mL (ref >1.3)
Pneumo Ab Type 70 (33F)*: 8.4 ug/mL (ref >1.3)
Pneumo Ab Type 8*: 4.3 ug/mL (ref >1.3)
Pneumo Ab Type 9 (9N)*: >21.7 ug/mL (ref >1.3)

## 2022-02-11 NOTE — Patient Instructions (Signed)
Access Code: [facility] URL: [email or web address] Date: 02/11/2022 Prepared by: Hilda Blades  Exercises - Sidelying Thoracic Lumbar Rotation  - 1-2 x daily - 10 reps - 5 seconds hold - Supine Scapular Protraction in Flexion with Dumbbells  - 1 x daily - 2-3 sets - 10 reps - Quadruped Thoracic Spine Extension  - 1-2 x daily - 10 reps - 5 seconds hold - Seated Shoulder Flexion Towel Slide at Table Top  - 2-3 x daily - 10 reps - 5 seconds  hold - Standing Upper Trapezius Mobilization with Small Ball  - Shoulder External Rotation and Scapular Retraction with Resistance  - 1 x daily - 2-3 sets - 10 reps - Standing Row with Anchored Resistance  - 1 x daily - 2-3 sets - 15 reps

## 2022-02-15 ENCOUNTER — Encounter: Payer: Self-pay | Admitting: Physical Therapy

## 2022-02-15 ENCOUNTER — Other Ambulatory Visit: Payer: Self-pay

## 2022-02-15 ENCOUNTER — Ambulatory Visit: Payer: No Typology Code available for payment source | Admitting: Physical Therapy

## 2022-02-15 DIAGNOSIS — M25511 Pain in right shoulder: Secondary | ICD-10-CM | POA: Diagnosis not present

## 2022-02-15 DIAGNOSIS — G8929 Other chronic pain: Secondary | ICD-10-CM

## 2022-02-15 DIAGNOSIS — M6281 Muscle weakness (generalized): Secondary | ICD-10-CM

## 2022-02-15 NOTE — Patient Instructions (Signed)
Access Code: [facility] URL: [email or web address] Date: 02/15/2022 Prepared by: Hilda Blades  Exercises - Sidelying Thoracic Lumbar Rotation  - 1-2 x daily - 10 reps - 5 seconds hold - Supine Scapular Protraction in Flexion with Dumbbells  - 1 x daily - 2-3 sets - 10 reps - Quadruped Thoracic Spine Extension  - 1-2 x daily - 10 reps - 5 seconds hold - Seated Shoulder Flexion Towel Slide at Table Top  - 2-3 x daily - 10 reps - 5 seconds  hold - Standing Upper Trapezius Mobilization with Small Ball  - Single Arm Doorway Pec Stretch at 90 Degrees Abduction  - 1-2 x daily - 3 reps - 30 hold - Shoulder External Rotation and Scapular Retraction with Resistance  - 1 x daily - 2-3 sets - 10 reps - Standing Row with Anchored Resistance  - 1 x daily - 2-3 sets - 15 reps

## 2022-02-15 NOTE — Therapy (Signed)
OUTPATIENT PHYSICAL THERAPY TREATMENT NOTE   Patient Name: Tracy [name] MRN: [id number] DOB:[birth year], 47 y.o., female Today's Date: 02/15/2022  PCP: Horald Pollen, MD REFERRING PROVIDER: Glennon Mac, DO   END OF SESSION:   PT End of Session - 02/15/22 0806     Visit Number 4    Number of Visits 13    Date for PT Re-Evaluation 03/16/22    Authorization Type [employer]    PT Start Time 0800    PT Stop Time 0845    PT Time Calculation (min) 45 min    Activity Tolerance Patient tolerated treatment well    Behavior During Therapy WFL for tasks assessed/performed               Past Medical History:  Diagnosis Date   Asthma    Breast lump    Eczema    Hypertension    Past Surgical History:  Procedure Laterality Date   BREAST LUMPECTOMY WITH RADIOACTIVE SEED LOCALIZATION Left 07/30/2020   Procedure: LEFT BREAST LUMPECTOMY WITH RADIOACTIVE SEED LOCALIZATION;  Surgeon: Erroll Luna, MD;  Location: [place];  Service: General;  Laterality: Left;   [place]  06/19/2008   [place] SURGERY  10/2010   Patient Active Problem List   Diagnosis Date Noted   Moderate persistent asthma, uncomplicated 21/22/4825   Perennial allergic rhinitis 12/02/2021   Recurrent infections 12/02/2021   Flexural atopic dermatitis 12/02/2021   Gastroesophageal reflux disease 12/02/2021   Essential (primary) hypertension 12/09/2016    REFERRING DIAG: Chronic right shoulder pain; Impingement syndrome of right shoulder   THERAPY DIAG:  Chronic right shoulder pain  Muscle weakness (generalized)  Rationale for Evaluation and Treatment Rehabilitation  PERTINENT HISTORY: None   PRECAUTIONS: None    SUBJECTIVE:            SUBJECTIVE STATEMENT:  Patient reports neck and shoulder are feeling better, but she when she goes to sleep at night the right arm will go to  sleep.  PAIN:  Are you having pain? Yes:  NPRS scale: 2/10 (7/10 with shoulder movement / activity) Pain location: Right shoulder Pain description: Intermittent, sharp, lightning Aggravating factors: Shoulder movement, reaching behind, overhead Relieving factors: Rest   OBJECTIVE: (objective measures completed at initial evaluation unless otherwise dated) PATIENT SURVEYS:  FOTO 58% functional status   POSTURE: Rounded shoulders and forward head   CERVICAL ROM:    Active ROM A/PROM (deg) eval  Flexion WFL  Extension WFL  Right lateral flexion WFL  Left lateral flexion WFL  Right rotation [facility]  Left rotation [facility]    Patient does report right shoulder pain with cervical right side bend   UPPER EXTREMITY ROM:    Active ROM Right eval Left eval Right 02/11/22  Shoulder flexion 135 150 145  Shoulder extension       Shoulder abduction       Shoulder internal rotation Reach to L1 Reach to T7 T10  Shoulder external rotation 70 Reach to C7 70 Reach to T4   (Blank rows = not tested)   UPPER EXTREMITY MMT:   MMT Right eval Left eval  Shoulder flexion 4 5  Shoulder extension      Shoulder abduction 4 5  Shoulder internal rotation 5 5  Shoulder external rotation 4 5  Middle trapezius      Lower trapezius      Elbow  flexion 5 5  Elbow extension 5 5  Grip strength (lbs)      (Blank rows = not tested)   SHOULDER SPECIAL TESTS: Impingement tests: Hawkins/Kennedy impingement test: positive   PALPATION:  Tender right upper trap, mid trap and rhomboid region, infraspinatus; referral to right anterior shoulder with mid trap palpation               TODAY'S TREATMENT:      [facility] Adult PT Treatment:                                                DATE: 02/15/2022 Therapeutic Exercise: UBE L2 x 4 min (fwd/bwd) while taking subjective Sidelying thoracic rotation x 10 each Thoracic extension over FR at various levels Corner pec stretch 3 x 20 sec Seated upper trap stretch 2  x 20 sec each Quadruped thoracic extension and shoulder stretch 5 x 5 sec Prone I, T, W x 10 each Supine chin tuck with small towel roll 2 x 10 Supine serratus punch with 7# 2 x 10 Double ER and scap retraction with blue 2 x 10   [facility] Adult PT Treatment:                                                DATE: 02/11/2022 Therapeutic Exercise: Supine dowel flexion with 2# 2 x 10 Supine horizontal abduction with green 2 x 10 Supine serratus punch with 5# x 10, 7# x 10 Sidelying thoracic rotation x 10 each Sidelying shoulder ER with 4# 2 x 10 Sidelying flexion with 4# 2 x 10 Sidelying abduction with 4# 2 x 10 Quadruped thoracic extension and shoulder stretch 5 x 5 sec Cat cow x 5 Prone I with 2# 2 x 10  [facility] Adult PT Treatment:                                                DATE: 02/09/2022 Therapeutic Exercise: UBE L1 x 4 min (2 fwd/bwd) while taking subjective Sidelying thoracic rotation x 10 each Thoracic extension over FR at various levels Quadruped thoracic extension and shoulder stretch 5 x 5 sec Cat cow x 5 Quadruped on forearms thoracic rotation hand behind head x 5 each\ Double ER and scap retraction with green 2 x 10 Manual: Skilled palpation and monitoring of muscle tension while performing TPDN STM right upper trap, infraspinatus, mid tap and rhomboid Trigger Point Dry Needling Treatment: Pre-treatment instruction: Patient instructed on dry needling rationale, procedures, and possible side effects including pain during treatment (achy,cramping feeling), bruising, drop of blood, lightheadedness, nausea, sweating. Patient Consent Given: Yes Education handout provided: No Muscles treated: Right upper trap, infraspinatus, mid trap/rhomboid  Needle size and number: .30x19m x 5 and .30x341mx 2 Electrical stimulation performed: No Parameters: N/A Treatment response/outcome: Twitch response elicited and Palpable decrease in muscle tension Post-treatment instructions: Patient  instructed to expect possible mild to moderate muscle soreness later today and/or tomorrow. Patient instructed in methods to reduce muscle soreness and to continue prescribed HEP. If patient was dry needled over the lung field, patient was instructed  on signs and symptoms of pneumothorax and, however unlikely, to see immediate medical attention should they occur. Patient was also educated on signs and symptoms of infection and to seek medical attention should they occur. Patient verbalized understanding of these instructions and education.   PATIENT EDUCATION: Education details: HEP update Person educated: Patient Education method: Explanation, Demonstration, Tactile cues, Verbal cues, and Handouts Education comprehension: verbalized understanding, returned demonstration, verbal cues required, tactile cues required, and needs further education   HOME EXERCISE PROGRAM: Access Code: [email or web address]      ASSESSMENT: CLINICAL IMPRESSION: Patient tolerated therapy well with no adverse effects. Therapy continues to progress her spinal mobility and progressing periscapular and DNF strengthening and endurance this visit. She reports improvement in her symptoms but the feeling of her right arm/hand falling asleep at night. Updated HEP to add stretching to promote improve posture and reduced tightness around pec/neck/shoulder. She would benefit from continued skilled PT to progress her mobility and strength in order to reduce pain and maximize functional ability.    OBJECTIVE IMPAIRMENTS: decreased activity tolerance, decreased ROM, decreased strength, postural dysfunction, and pain.    ACTIVITY LIMITATIONS: carrying, lifting, sleeping, dressing, reach over head, and hygiene/grooming   PARTICIPATION LIMITATIONS: meal prep, cleaning, laundry, shopping, and occupation   PERSONAL FACTORS: Fitness, Past/current experiences, and Time since onset of injury/illness/exacerbation are also affecting patient's functional  outcome.      GOALS: Goals reviewed with patient? Yes   SHORT TERM GOALS: Target date: 02/23/2022   Patient will be I with initial HEP in order to progress with therapy. Baseline: HEP provided at eval Goal status: INITIAL   2.  PT will review FOTO with patient by 3rd visit in order to understand expected progress and outcome with therapy. Baseline: FOTO assessed at eval Goal status: INITIAL   3.  Patient will report right shoulder pain </= 4/10 with shoulder movement and activity to reduce functional limitations Baseline:  Goal status: INITIAL   LONG TERM GOALS: Target date: 03/16/2022   Patient will be I with final HEP to maintain progress from PT. Baseline: HEP provided at eval Goal status: INITIAL   2.  Patient will report >/= 68% status on FOTO to indicate improved functional ability. Baseline: 58% functional status Goal status: INITIAL   3.  Patient will demonstrate right shoulder elevation >/= 150 deg to improve overhead reach and completing grooming tasks Baseline: 135 deg Goal status: INITIAL   4.  Patient will demonstrate right shoulder strength >/= 5/5 MMT in order to improve lifting and performing household tasks with right arm Baseline: strength deficits noted above Goal status: INITIAL     PLAN: PT FREQUENCY: 1-2x/week   PT DURATION: 6 weeks   PLANNED INTERVENTIONS: Therapeutic exercises, Therapeutic activity, Neuromuscular re-education, Balance training, Gait training, Patient/Family education, Self Care, Joint mobilization, Joint manipulation, Aquatic Therapy, Dry Needling, Spinal manipulation, Spinal mobilization, Cryotherapy, Moist heat, Taping, Ionotophoresis '4mg'$ /ml Dexamethasone, Manual therapy, and Re-evaluation   PLAN FOR NEXT SESSION: Review HEP and progress PRN, manual / dry needling for upper trap/infra/mid trap/rhomboid, right shoulder stretching and mobilizations, progress rotator cuff and postural strengthening   Hilda Blades, PT, DPT, LAT,  ATC 02/15/22  8:47 AM Phone: [phone] Fax: [phone]

## 2022-02-17 NOTE — Therapy (Signed)
OUTPATIENT PHYSICAL THERAPY TREATMENT NOTE   Patient Name: Tracy [name] MRN: [id number] DOB:November 24, 1975, 47 y.o., female Today's Date: 02/18/2022  PCP: Horald Pollen, MD REFERRING PROVIDER: Glennon Mac, DO   END OF SESSION:   PT End of Session - 02/18/22 0807     Visit Number 5    Number of Visits 13    Date for PT Re-Evaluation 03/16/22    Authorization Type [employer]et[employer]    PT Start Time 0803    PT Stop Time 0850    PT Time Calculation (min) 47 min    Activity Tolerance Patient tolerated treatment well    Behavior During Therapy [facility] for tasks assessed/performed                Past Medical History:  Diagnosis Date   Asthma    Breast lump    Eczema    Hypertension    Past Surgical History:  Procedure Laterality Date   BREAST LUMPECTOMY WITH RADIOACTIVE SEED LOCALIZATION Left 07/30/2020   Procedure: LEFT BREAST LUMPECTOMY WITH RADIOACTIVE SEED LOCALIZATION;  Surgeon: Erroll Luna, MD;  Location: [place];  Service: General;  Laterality: Left;   [place]  06/19/2008   [place] SURGERY  10/2010   Patient Active Problem List   Diagnosis Date Noted   Moderate persistent asthma, uncomplicated 62/70/3500   Perennial allergic rhinitis 12/02/2021   Recurrent infections 12/02/2021   Flexural atopic dermatitis 12/02/2021   Gastroesophageal reflux disease 12/02/2021   Essential (primary) hypertension 12/09/2016    REFERRING DIAG: Chronic right shoulder pain; Impingement syndrome of right shoulder   THERAPY DIAG:  Chronic right shoulder pain  Muscle weakness (generalized)  Rationale for Evaluation and Treatment Rehabilitation  PERTINENT HISTORY: None   PRECAUTIONS: None    SUBJECTIVE:            SUBJECTIVE STATEMENT:  Patient reports she sort of had a cramp in her right shoulder this morning. Overall continues to do well.  PAIN:  Are you  having pain? Yes:  NPRS scale: 2/10 (7/10 with shoulder movement / activity) Pain location: Right shoulder Pain description: Intermittent, sharp, lightning Aggravating factors: Shoulder movement, reaching behind, overhead Relieving factors: Rest   OBJECTIVE: (objective measures completed at initial evaluation unless otherwise dated) PATIENT SURVEYS:  FOTO 58% functional status   POSTURE: Rounded shoulders and forward head   CERVICAL ROM:    Active ROM A/PROM (deg) eval  Flexion WFL  Extension WFL  Right lateral flexion WFL  Left lateral flexion WFL  Right rotation [facility]  Left rotation [facility]    Patient does report right shoulder pain with cervical right side bend   UPPER EXTREMITY ROM:    Active ROM Right eval Left eval Right 02/11/22  Shoulder flexion 135 150 145  Shoulder extension       Shoulder abduction       Shoulder internal rotation Reach to L1 Reach to T7 T10  Shoulder external rotation 70 Reach to C7 70 Reach to T4   (Blank rows = not tested)   UPPER EXTREMITY MMT:   MMT Right eval Left eval Rt / Lt 02/18/22  Shoulder flexion 4 5   Shoulder extension       Shoulder abduction 4 5   Shoulder internal rotation 5 5   Shoulder external rotation '4 5 4 '$ / 5  Middle trapezius     4- /  4  Lower trapezius     4- / 4  Elbow flexion 5 5   Elbow extension 5 5   Grip strength (lbs)       (Blank rows = not tested)   SHOULDER SPECIAL TESTS: Impingement tests: Hawkins/Kennedy impingement test: positive   PALPATION:  Tender right upper trap, mid trap and rhomboid region, infraspinatus; referral to right anterior shoulder with mid trap palpation               TODAY'S TREATMENT:      [facility] Adult PT Treatment:                                                DATE: 02/17/2022 Therapeutic Exercise: UBE L2 x 4 min (fwd/bwd) while taking subjective Sleeper stretch 3 x 20 sec with right Sidelying cross body stretch 3 x 20 sec with right Sidelying thoracic rotation x 10  each Doorway pec stretch at 90 deg 3 x 20 sec each Prone I, T, W, Y x 10 each Double ER and scap retraction with blue 2 x 10 Manual: Skilled palpation and monitoring of muscle tension while performing TPDN STM right upper trap, infraspinatus, mid tap and rhomboid Trigger Point Dry Needling Treatment: Pre-treatment instruction: Patient instructed on dry needling rationale, procedures, and possible side effects including pain during treatment (achy,cramping feeling), bruising, drop of blood, lightheadedness, nausea, sweating. Patient Consent Given: Yes Education handout provided: No Muscles treated: Right upper trap, infraspinatus, mid trap/rhomboid  Needle size and number: .30x39m x 6 Electrical stimulation performed: No Parameters: N/A Treatment response/outcome: Twitch response elicited and Palpable decrease in muscle tension Post-treatment instructions: Patient instructed to expect possible mild to moderate muscle soreness later today and/or tomorrow. Patient instructed in methods to reduce muscle soreness and to continue prescribed HEP. If patient was dry needled over the lung field, patient was instructed on signs and symptoms of pneumothorax and, however unlikely, to see immediate medical attention should they occur. Patient was also educated on signs and symptoms of infection and to seek medical attention should they occur. Patient verbalized understanding of these instructions and education.   [facility]                                                DATE: 02/15/2022 Therapeutic Exercise: UBE L2 x 4 min (fwd/bwd) while taking subjective Sidelying thoracic rotation x 10 each Thoracic extension over FR at various levels Corner pec stretch 3 x 20 sec Seated upper trap stretch 2 x 20 sec each Quadruped thoracic extension and shoulder stretch 5 x 5 sec Prone I, T, W x 10 each Supine chin tuck with small towel roll 2 x 10 Supine serratus punch with 7# 2 x 10 Double ER and  scap retraction with blue 2 x 10  [facility] Adult PT Treatment:                                                DATE: 02/11/2022 Therapeutic Exercise: Supine dowel flexion with 2# 2 x 10 Supine horizontal abduction with green 2 x 10  Supine serratus punch with 5# x 10, 7# x 10 Sidelying thoracic rotation x 10 each Sidelying shoulder ER with 4# 2 x 10 Sidelying flexion with 4# 2 x 10 Sidelying abduction with 4# 2 x 10 Quadruped thoracic extension and shoulder stretch 5 x 5 sec Cat cow x 5 Prone I with 2# 2 x 10   PATIENT EDUCATION: Education details: HEP update Person educated: Patient Education method: Explanation, Demonstration, Tactile cues, Verbal cues, and Handouts Education comprehension: verbalized understanding, returned demonstration, verbal cues required, tactile cues required, and needs further education   HOME EXERCISE PROGRAM: Access Code: [email or web address]      ASSESSMENT: CLINICAL IMPRESSION: Patient tolerated therapy well with no adverse effects. Performed TPDN this visit for the right neck and shoulder complex with good therapeutic benefit. Therapy continues to focus on progressing shoulder and spinal mobility, and progressing postural strengthening and control with good tolerance. She does exhibit greater difficulty with prone scapular strengthening on the right and require cueing to avoid upper trap shrug compensation. Updated her HEP to progress shoulder mobility. She would benefit from continued skilled PT to progress her mobility and strength in order to reduce pain and maximize functional ability.    OBJECTIVE IMPAIRMENTS: decreased activity tolerance, decreased ROM, decreased strength, postural dysfunction, and pain.    ACTIVITY LIMITATIONS: carrying, lifting, sleeping, dressing, reach over head, and hygiene/grooming   PARTICIPATION LIMITATIONS: meal prep, cleaning, laundry, shopping, and occupation   PERSONAL FACTORS: Fitness, Past/current experiences, and Time since onset  of injury/illness/exacerbation are also affecting patient's functional outcome.      GOALS: Goals reviewed with patient? Yes   SHORT TERM GOALS: Target date: 02/23/2022   Patient will be I with initial HEP in order to progress with therapy. Baseline: HEP provided at eval Goal status: INITIAL   2.  PT will review FOTO with patient by 3rd visit in order to understand expected progress and outcome with therapy. Baseline: FOTO assessed at eval Goal status: INITIAL   3.  Patient will report right shoulder pain </= 4/10 with shoulder movement and activity to reduce functional limitations Baseline:  Goal status: INITIAL   LONG TERM GOALS: Target date: 03/16/2022   Patient will be I with final HEP to maintain progress from PT. Baseline: HEP provided at eval Goal status: INITIAL   2.  Patient will report >/= 68% status on FOTO to indicate improved functional ability. Baseline: 58% functional status Goal status: INITIAL   3.  Patient will demonstrate right shoulder elevation >/= 150 deg to improve overhead reach and completing grooming tasks Baseline: 135 deg Goal status: INITIAL   4.  Patient will demonstrate right shoulder strength >/= 5/5 MMT in order to improve lifting and performing household tasks with right arm Baseline: strength deficits noted above Goal status: INITIAL     PLAN: PT FREQUENCY: 1-2x/week   PT DURATION: 6 weeks   PLANNED INTERVENTIONS: Therapeutic exercises, Therapeutic activity, Neuromuscular re-education, Balance training, Gait training, Patient/Family education, Self Care, Joint mobilization, Joint manipulation, Aquatic Therapy, Dry Needling, Spinal manipulation, Spinal mobilization, Cryotherapy, Moist heat, Taping, Ionotophoresis '4mg'$ /ml Dexamethasone, Manual therapy, and Re-evaluation   PLAN FOR NEXT SESSION: Review HEP and progress PRN, manual / dry needling for upper trap/infra/mid trap/rhomboid, right shoulder stretching and mobilizations, progress  rotator cuff and postural strengthening   Hilda Blades, PT, DPT, LAT, ATC 02/18/22  9:06 AM Phone: [phone] Fax: [phone]

## 2022-02-18 ENCOUNTER — Encounter: Payer: Self-pay | Admitting: Physical Therapy

## 2022-02-18 ENCOUNTER — Ambulatory Visit: Payer: No Typology Code available for payment source | Attending: Sports Medicine | Admitting: Physical Therapy

## 2022-02-18 ENCOUNTER — Other Ambulatory Visit: Payer: Self-pay

## 2022-02-18 DIAGNOSIS — M25511 Pain in right shoulder: Secondary | ICD-10-CM | POA: Diagnosis present

## 2022-02-18 DIAGNOSIS — M6281 Muscle weakness (generalized): Secondary | ICD-10-CM | POA: Insufficient documentation

## 2022-02-18 DIAGNOSIS — G8929 Other chronic pain: Secondary | ICD-10-CM | POA: Diagnosis present

## 2022-02-18 NOTE — Patient Instructions (Signed)
Access Code: [facility] URL: [email or web address] Date: 02/18/2022 Prepared by: Hilda Blades  Exercises - Sidelying Thoracic Lumbar Rotation  - 1-2 x daily - 10 reps - 5 seconds hold - Sleeper Stretch  - 1-2 x daily - 3 reps - 20 seconds hold - Sidelying Posterior Cuff Cross Body Stretch  - 1-2 x daily - 3 reps - 20 seconds hold - Supine Scapular Protraction in Flexion with Dumbbells  - 1 x daily - 2-3 sets - 10 reps - Quadruped Thoracic Spine Extension  - 1-2 x daily - 10 reps - 5 seconds hold - Standing Upper Trapezius Mobilization with Small Ball  - Single Arm Doorway Pec Stretch at 90 Degrees Abduction  - 1-2 x daily - 3 reps - 30 hold - Shoulder External Rotation and Scapular Retraction with Resistance  - 1 x daily - 2-3 sets - 10 reps - Standing Row with Anchored Resistance  - 1 x daily - 2-3 sets - 15 reps

## 2022-02-23 ENCOUNTER — Other Ambulatory Visit: Payer: Self-pay

## 2022-02-23 ENCOUNTER — Encounter: Payer: Self-pay | Admitting: Physical Therapy

## 2022-02-23 ENCOUNTER — Ambulatory Visit: Payer: No Typology Code available for payment source | Admitting: Physical Therapy

## 2022-02-23 DIAGNOSIS — G8929 Other chronic pain: Secondary | ICD-10-CM

## 2022-02-23 DIAGNOSIS — M25511 Pain in right shoulder: Secondary | ICD-10-CM | POA: Diagnosis not present

## 2022-02-23 DIAGNOSIS — M6281 Muscle weakness (generalized): Secondary | ICD-10-CM

## 2022-02-23 NOTE — Therapy (Signed)
OUTPATIENT PHYSICAL THERAPY TREATMENT NOTE   Patient Name: Tracy [name] MRN: [id number] DOB:[birth year], 47 y.o., female Today's Date: 02/23/2022  PCP: Horald Pollen, MD REFERRING PROVIDER: Glennon Mac, DO   END OF SESSION:   PT End of Session - 02/23/22 0849     Visit Number 6    Number of Visits 13    Date for PT Re-Evaluation 03/16/22    Authorization Type [employer]et[employer]    PT Start Time 0845    PT Stop Time 0925    PT Time Calculation (min) 40 min    Activity Tolerance Patient tolerated treatment well    Behavior During Therapy [facility] for tasks assessed/performed                 Past Medical History:  Diagnosis Date   Asthma    Breast lump    Eczema    Hypertension    Past Surgical History:  Procedure Laterality Date   BREAST LUMPECTOMY WITH RADIOACTIVE SEED LOCALIZATION Left 07/30/2020   Procedure: LEFT BREAST LUMPECTOMY WITH RADIOACTIVE SEED LOCALIZATION;  Surgeon: Erroll Luna, MD;  Location: [place];  Service: General;  Laterality: Left;   [place]  06/19/2008   [place] SURGERY  10/2010   Patient Active Problem List   Diagnosis Date Noted   Moderate persistent asthma, uncomplicated 53/66/4403   Perennial allergic rhinitis 12/02/2021   Recurrent infections 12/02/2021   Flexural atopic dermatitis 12/02/2021   Gastroesophageal reflux disease 12/02/2021   Essential (primary) hypertension 12/09/2016    REFERRING DIAG: Chronic right shoulder pain; Impingement syndrome of right shoulder   THERAPY DIAG:  Chronic right shoulder pain  Muscle weakness (generalized)  Rationale for Evaluation and Treatment Rehabilitation  PERTINENT HISTORY: None   PRECAUTIONS: None    SUBJECTIVE:            SUBJECTIVE STATEMENT:  Patient reports she is tired today due to father being in ER last night. She was sore following the dry needling last visit  but once the soreness went away her neck and shoulder region felt much better.   PAIN:  Are you having pain? Yes:  NPRS scale: 1/10 (4/10 with shoulder movement / activity) Pain location: Right shoulder Pain description: Intermittent, sharp, lightning Aggravating factors: Shoulder movement, reaching behind, overhead Relieving factors: Rest   OBJECTIVE: (objective measures completed at initial evaluation unless otherwise dated) PATIENT SURVEYS:  FOTO 58% functional status  POSTURE: Rounded shoulders and forward head   CERVICAL ROM:    Active ROM A/PROM (deg) eval  Flexion WFL  Extension WFL  Right lateral flexion WFL  Left lateral flexion WFL  Right rotation [facility]  Left rotation [facility]    Patient does report right shoulder pain with cervical right side bend   UPPER EXTREMITY ROM:    Active ROM Right eval Left eval Right 02/11/22 Right 02/23/22  Shoulder flexion 135 150 145 145  Shoulder extension        Shoulder abduction        Shoulder internal rotation Reach to L1 Reach to T7 T10 T9  Shoulder external rotation 70 Reach to C7 70 Reach to T4    (Blank rows = not tested)   UPPER EXTREMITY MMT:   MMT Right eval Left eval Rt / Lt 02/18/22  Shoulder flexion 4 5   Shoulder extension       Shoulder abduction 4 5  Shoulder internal rotation 5 5   Shoulder external rotation '4 5 4 '$ / 5  Middle trapezius     4- / 4  Lower trapezius     4- / 4  Elbow flexion 5 5   Elbow extension 5 5   Grip strength (lbs)       (Blank rows = not tested)   SHOULDER SPECIAL TESTS: Impingement tests: Hawkins/Kennedy impingement test: positive   PALPATION:  Tender right upper trap, mid trap and rhomboid region, infraspinatus; referral to right anterior shoulder with mid trap palpation               TODAY'S TREATMENT:      [facility] Adult PT Treatment:                                                DATE: 02/23/2022 Therapeutic Exercise: UBE L2 x 4 min (fwd/bwd) while taking  subjective Sidelying thoracic rotation x 10 each Sleeper stretch 3 x 15 sec with right Sidelying cross body stretch 3 x 15 sec with right Supine pec stretch on FR at various ranges of elevation Supine horizontal abduction with green on FR 2 x 10 Supine serratus punch with 8# on FR 2 x 10 Seated double ER and scap retraction with green 2 x 10 Row with FM 17# x 10, 23# 2 x 10 Prone W with overhead reach 2 x 10   [facility] Adult PT Treatment:                                                DATE: 02/17/2022 Therapeutic Exercise: UBE L2 x 4 min (fwd/bwd) while taking subjective Sleeper stretch 3 x 20 sec with right Sidelying cross body stretch 3 x 20 sec with right Sidelying thoracic rotation x 10 each Doorway pec stretch at 90 deg 3 x 20 sec each Prone I, T, W, Y x 10 each Double ER and scap retraction with blue 2 x 10 Manual: Skilled palpation and monitoring of muscle tension while performing TPDN STM right upper trap, infraspinatus, mid tap and rhomboid Trigger Point Dry Needling Treatment: Pre-treatment instruction: Patient instructed on dry needling rationale, procedures, and possible side effects including pain during treatment (achy,cramping feeling), bruising, drop of blood, lightheadedness, nausea, sweating. Patient Consent Given: Yes Education handout provided: No Muscles treated: Right upper trap, infraspinatus, mid trap/rhomboid  Needle size and number: .30x51m x 6 Electrical stimulation performed: No Parameters: N/A Treatment response/outcome: Twitch response elicited and Palpable decrease in muscle tension Post-treatment instructions: Patient instructed to expect possible mild to moderate muscle soreness later today and/or tomorrow. Patient instructed in methods to reduce muscle soreness and to continue prescribed HEP. If patient was dry needled over the lung field, patient was instructed on signs and symptoms of pneumothorax and, however unlikely, to see immediate medical  attention should they occur. Patient was also educated on signs and symptoms of infection and to seek medical attention should they occur. Patient verbalized understanding of these instructions and education.  [facility]  [facility]: [facility] Therapeutic Exercise: UBE L2 x 4 min (fwd/bwd) while taking subjective Sidelying thoracic rotation x 10 each Thoracic extension over FR at various levels Corner pec stretch 3 x 20 sec Seated upper trap stretch 2 x 20 sec each Quadruped thoracic extension and shoulder stretch 5 x 5 sec Prone I, T, W x 10 each Supine chin tuck with small towel roll 2 x 10 Supine serratus punch with 7# 2 x 10 Double ER and scap retraction with blue 2 x 10   PATIENT EDUCATION: Education details: HEP Person educated: Patient Education method: Consulting civil engineer, Demonstration, Corporate treasurer cues, Verbal cues Education comprehension: verbalized understanding, returned demonstration, verbal cues required, tactile cues required, and needs further education   HOME EXERCISE PROGRAM: Access Code: [email or web address]      ASSESSMENT: CLINICAL IMPRESSION: Patient tolerated therapy well with no adverse effects. Therapy focused primarily on progressing postural strength and control with good tolerance. She continues to demonstrate limitations with her right shoulder motion but this is improving. She is progressing with her periscapular strengthening without any increased pain in therapy, however she does report fatigue and muscle burning. No changes to HEP this visit. She would benefit from continued skilled PT to progress her mobility and strength in order to reduce pain and maximize functional ability.    OBJECTIVE IMPAIRMENTS: decreased activity tolerance, decreased ROM, decreased strength, postural dysfunction, and pain.    ACTIVITY LIMITATIONS: carrying, lifting, sleeping, dressing, reach over head, and hygiene/grooming   PARTICIPATION  LIMITATIONS: meal prep, cleaning, laundry, shopping, and occupation   PERSONAL FACTORS: Fitness, Past/current experiences, and Time since onset of injury/illness/exacerbation are also affecting patient's functional outcome.      GOALS: Goals reviewed with patient? Yes   SHORT TERM GOALS: Target date: 02/23/2022   Patient will be I with initial HEP in order to progress with therapy. Baseline: HEP provided at eval 02/23/2022: independent Goal status: MET   2.  PT will review FOTO with patient by 3rd visit in order to understand expected progress and outcome with therapy. Baseline: FOTO assessed at eval 02/23/2022: reviewed Goal status: MET   3.  Patient will report right shoulder pain </= 4/10 with shoulder movement and activity to reduce functional limitations Baseline:  02/23/2022: 4/10 pain Goal status: MET   LONG TERM GOALS: Target date: 03/16/2022   Patient will be I with final HEP to maintain progress from PT. Baseline: HEP provided at eval Goal status: INITIAL   2.  Patient will report >/= 68% status on FOTO to indicate improved functional ability. Baseline: 58% functional status Goal status: INITIAL   3.  Patient will demonstrate right shoulder elevation >/= 150 deg to improve overhead reach and completing grooming tasks Baseline: 135 deg Goal status: INITIAL   4.  Patient will demonstrate right shoulder strength >/= 5/5 MMT in order to improve lifting and performing household tasks with right arm Baseline: strength deficits noted above Goal status: INITIAL     PLAN: PT FREQUENCY: 1-2x/week   PT DURATION: 6 weeks   PLANNED INTERVENTIONS: Therapeutic exercises, Therapeutic activity, Neuromuscular re-education, Balance training, Gait training, Patient/Family education, Self Care, Joint mobilization, Joint manipulation, Aquatic Therapy, Dry Needling, Spinal manipulation, Spinal mobilization, Cryotherapy, Moist heat, Taping, Ionotophoresis '4mg'$ /ml Dexamethasone, Manual  therapy, and Re-evaluation   PLAN FOR NEXT SESSION: Review HEP and progress PRN, manual / dry needling for upper trap/infra/mid trap/rhomboid, right shoulder stretching and mobilizations, progress rotator cuff and postural strengthening   Hilda Blades, PT, DPT, LAT, ATC 02/23/22  9:33 AM Phone:  [phone] Fax: [phone]

## 2022-02-24 NOTE — Therapy (Incomplete)
OUTPATIENT PHYSICAL THERAPY TREATMENT NOTE   Patient Name: Tracy [name] MRN: [id number] DOB:Jun 23, 1975, 47 y.o., female Today's Date: 02/24/2022  PCP: Horald Pollen, MD REFERRING PROVIDER: Glennon Mac, DO   END OF SESSION:         Past Medical History:  Diagnosis Date   Asthma    Breast lump    Eczema    Hypertension    Past Surgical History:  Procedure Laterality Date   BREAST LUMPECTOMY WITH RADIOACTIVE SEED LOCALIZATION Left 07/30/2020   Procedure: LEFT BREAST LUMPECTOMY WITH RADIOACTIVE SEED LOCALIZATION;  Surgeon: Erroll Luna, MD;  Location: [facility];  Service: General;  Laterality: Left;   [place]   CESAREAN SECTION  06/19/2008   [place] SURGERY  10/2010   Patient Active Problem List   Diagnosis Date Noted   Moderate persistent asthma, uncomplicated 51/88/4166   Perennial allergic rhinitis 12/02/2021   Recurrent infections 12/02/2021   Flexural atopic dermatitis 12/02/2021   Gastroesophageal reflux disease 12/02/2021   Essential (primary) hypertension 12/09/2016    REFERRING DIAG: Chronic right shoulder pain; Impingement syndrome of right shoulder   THERAPY DIAG:  No diagnosis found.  Rationale for Evaluation and Treatment Rehabilitation  PERTINENT HISTORY: None   PRECAUTIONS: None    SUBJECTIVE:            SUBJECTIVE STATEMENT:  Patient reports she is tired today due to father being in ER last night. She was sore following the dry needling last visit but once the soreness went away her neck and shoulder region felt much better.   PAIN:  Are you having pain? Yes:  NPRS scale: 1/10 (4/10 with shoulder movement / activity) Pain location: Right shoulder Pain description: Intermittent, sharp, lightning Aggravating factors: Shoulder movement, reaching behind, overhead Relieving factors: Rest   OBJECTIVE: (objective measures completed at  initial evaluation unless otherwise dated) PATIENT SURVEYS:  FOTO 58% functional status  POSTURE: Rounded shoulders and forward head   CERVICAL ROM:    Active ROM A/PROM (deg) eval  Flexion WFL  Extension WFL  Right lateral flexion WFL  Left lateral flexion WFL  Right rotation [facility]  Left rotation [facility]    Patient does report right shoulder pain with cervical right side bend   UPPER EXTREMITY ROM:    Active ROM Right eval Left eval Right 02/11/22 Right 02/23/22  Shoulder flexion 135 150 145 145  Shoulder extension        Shoulder abduction        Shoulder internal rotation Reach to L1 Reach to T7 T10 T9  Shoulder external rotation 70 Reach to C7 70 Reach to T4    (Blank rows = not tested)   UPPER EXTREMITY MMT:   MMT Right eval Left eval Rt / Lt 02/18/22  Shoulder flexion 4 5   Shoulder extension       Shoulder abduction 4 5   Shoulder internal rotation 5 5   Shoulder external rotation '4 5 4 '$ / 5  Middle trapezius     4- / 4  Lower trapezius     4- / 4  Elbow flexion 5 5   Elbow extension 5 5   Grip strength (lbs)       (Blank rows = not tested)   SHOULDER SPECIAL TESTS: Impingement tests: Hawkins/Kennedy impingement test: positive   PALPATION:  Tender right upper trap, mid trap and rhomboid region, infraspinatus; referral to right  anterior shoulder with mid trap palpation               TODAY'S TREATMENT:      [facility] Adult PT Treatment:                                                DATE: 02/25/2022 Therapeutic Exercise: UBE L2 x 4 min (fwd/bwd) while taking subjective Sidelying thoracic rotation x 10 each Sleeper stretch 3 x 15 sec with right Sidelying cross body stretch 3 x 15 sec with right Supine pec stretch on FR at various ranges of elevation Supine horizontal abduction with green on FR 2 x 10 Supine serratus punch with 8# on FR 2 x 10 Seated double ER and scap retraction with green 2 x 10 Row with FM 17# x 10, 23# 2 x 10 Prone W with overhead reach 2  x 10   [facility] Adult PT Treatment:                                                DATE: 02/23/2022 Therapeutic Exercise: UBE L2 x 4 min (fwd/bwd) while taking subjective Sidelying thoracic rotation x 10 each Sleeper stretch 3 x 15 sec with right Sidelying cross body stretch 3 x 15 sec with right Supine pec stretch on FR at various ranges of elevation Supine horizontal abduction with green on FR 2 x 10 Supine serratus punch with 8# on FR 2 x 10 Seated double ER and scap retraction with green 2 x 10 Row with FM 17# x 10, 23# 2 x 10 Prone W with overhead reach 2 x 10  [facility] Adult PT Treatment:                                                DATE: 02/17/2022 Therapeutic Exercise: UBE L2 x 4 min (fwd/bwd) while taking subjective Sleeper stretch 3 x 20 sec with right Sidelying cross body stretch 3 x 20 sec with right Sidelying thoracic rotation x 10 each Doorway pec stretch at 90 deg 3 x 20 sec each Prone I, T, W, Y x 10 each Double ER and scap retraction with blue 2 x 10 Manual: Skilled palpation and monitoring of muscle tension while performing TPDN STM right upper trap, infraspinatus, mid tap and rhomboid Trigger Point Dry Needling Treatment: Pre-treatment instruction: Patient instructed on dry needling rationale, procedures, and possible side effects including pain during treatment (achy,cramping feeling), bruising, drop of blood, lightheadedness, nausea, sweating. Patient Consent Given: Yes Education handout provided: No Muscles treated: Right upper trap, infraspinatus, mid trap/rhomboid  Needle size and number: .30x46m x 6 Electrical stimulation performed: No Parameters: N/A Treatment response/outcome: Twitch response elicited and Palpable decrease in muscle tension Post-treatment instructions: Patient instructed to expect possible mild to moderate muscle soreness later today and/or tomorrow. Patient instructed in methods to reduce muscle soreness and to continue prescribed HEP. If  patient was dry needled over the lung field, patient was instructed on signs and symptoms of pneumothorax and, however unlikely, to see immediate medical attention should they occur. Patient was also educated on signs and  symptoms of infection and to seek medical attention should they occur. Patient verbalized understanding of these instructions and education.  [facility] Adult PT Treatment:                                                DATE: 02/15/2022 Therapeutic Exercise: UBE L2 x 4 min (fwd/bwd) while taking subjective Sidelying thoracic rotation x 10 each Thoracic extension over FR at various levels Corner pec stretch 3 x 20 sec Seated upper trap stretch 2 x 20 sec each Quadruped thoracic extension and shoulder stretch 5 x 5 sec Prone I, T, W x 10 each Supine chin tuck with small towel roll 2 x 10 Supine serratus punch with 7# 2 x 10 Double ER and scap retraction with blue 2 x 10   PATIENT EDUCATION: Education details: HEP Person educated: Patient Education method: Consulting civil engineer, Demonstration, Corporate treasurer cues, Verbal cues Education comprehension: verbalized understanding, returned demonstration, verbal cues required, tactile cues required, and needs further education   HOME EXERCISE PROGRAM: Access Code: [email or web address]      ASSESSMENT: CLINICAL IMPRESSION: Patient tolerated therapy well with no adverse effects. *** Patient would benefit from continued skilled PT to progress her mobility and strength in order to reduce pain and maximize functional ability.  Therapy focused primarily on progressing postural strength and control with good tolerance. She continues to demonstrate limitations with her right shoulder motion but this is improving. She is progressing with her periscapular strengthening without any increased pain in therapy, however she does report fatigue and muscle burning. No changes to HEP this visit. She would benefit from continued skilled PT to progress her mobility and strength in  order to reduce pain and maximize functional ability.    OBJECTIVE IMPAIRMENTS: decreased activity tolerance, decreased ROM, decreased strength, postural dysfunction, and pain.    ACTIVITY LIMITATIONS: carrying, lifting, sleeping, dressing, reach over head, and hygiene/grooming   PARTICIPATION LIMITATIONS: meal prep, cleaning, laundry, shopping, and occupation   PERSONAL FACTORS: Fitness, Past/current experiences, and Time since onset of injury/illness/exacerbation are also affecting patient's functional outcome.      GOALS: Goals reviewed with patient? Yes   SHORT TERM GOALS: Target date: 02/23/2022   Patient will be I with initial HEP in order to progress with therapy. Baseline: HEP provided at eval 02/23/2022: independent Goal status: MET   2.  PT will review FOTO with patient by 3rd visit in order to understand expected progress and outcome with therapy. Baseline: FOTO assessed at eval 02/23/2022: reviewed Goal status: MET   3.  Patient will report right shoulder pain </= 4/10 with shoulder movement and activity to reduce functional limitations Baseline:  02/23/2022: 4/10 pain Goal status: MET   LONG TERM GOALS: Target date: 03/16/2022   Patient will be I with final HEP to maintain progress from PT. Baseline: HEP provided at eval Goal status: INITIAL   2.  Patient will report >/= 68% status on FOTO to indicate improved functional ability. Baseline: 58% functional status Goal status: INITIAL   3.  Patient will demonstrate right shoulder elevation >/= 150 deg to improve overhead reach and completing grooming tasks Baseline: 135 deg Goal status: INITIAL   4.  Patient will demonstrate right shoulder strength >/= 5/5 MMT in order to improve lifting and performing household tasks with right arm Baseline: strength deficits noted above Goal status: INITIAL  PLAN: PT FREQUENCY: 1-2x/week   PT DURATION: 6 weeks   PLANNED INTERVENTIONS: Therapeutic exercises, Therapeutic  activity, Neuromuscular re-education, Balance training, Gait training, Patient/Family education, Self Care, Joint mobilization, Joint manipulation, Aquatic Therapy, Dry Needling, Spinal manipulation, Spinal mobilization, Cryotherapy, Moist heat, Taping, Ionotophoresis '4mg'$ /ml Dexamethasone, Manual therapy, and Re-evaluation   PLAN FOR NEXT SESSION: Review HEP and progress PRN, manual / dry needling for upper trap/infra/mid trap/rhomboid, right shoulder stretching and mobilizations, progress rotator cuff and postural strengthening   Hilda Blades, PT, DPT, LAT, ATC 02/24/22  8:58 AM Phone: [phone] Fax: [phone]

## 2022-02-25 ENCOUNTER — Ambulatory Visit: Payer: No Typology Code available for payment source | Admitting: Physical Therapy

## 2022-03-02 ENCOUNTER — Encounter: Payer: Self-pay | Admitting: Physical Therapy

## 2022-03-02 ENCOUNTER — Other Ambulatory Visit: Payer: Self-pay

## 2022-03-02 ENCOUNTER — Ambulatory Visit: Payer: No Typology Code available for payment source | Admitting: Physical Therapy

## 2022-03-02 DIAGNOSIS — G8929 Other chronic pain: Secondary | ICD-10-CM

## 2022-03-02 DIAGNOSIS — M6281 Muscle weakness (generalized): Secondary | ICD-10-CM

## 2022-03-02 DIAGNOSIS — M25511 Pain in right shoulder: Secondary | ICD-10-CM | POA: Diagnosis not present

## 2022-03-02 NOTE — Therapy (Signed)
OUTPATIENT PHYSICAL THERAPY TREATMENT NOTE   Patient Name: Tracy [name] MRN: [id number] DOB:[birth year], 47 y.o., female Today's Date: 03/02/2022  PCP: Horald Pollen, MD REFERRING PROVIDER: Glennon Mac, DO   END OF SESSION:   PT End of Session - 03/02/22 0849     Visit Number 7    Number of Visits 13    Date for PT Re-Evaluation 03/16/22    Authorization Type [employer]    PT Start Time 0845    PT Stop Time 0925    PT Time Calculation (min) 40 min    Activity Tolerance Patient tolerated treatment well    Behavior During Therapy [facility] for tasks assessed/performed                  Past Medical History:  Diagnosis Date   Asthma    Breast lump    Eczema    Hypertension    Past Surgical History:  Procedure Laterality Date   BREAST LUMPECTOMY WITH RADIOACTIVE SEED LOCALIZATION Left 07/30/2020   Procedure: LEFT BREAST LUMPECTOMY WITH RADIOACTIVE SEED LOCALIZATION;  Surgeon: Erroll Luna, MD;  Location: [place];  Service: General;  Laterality: Left;   [place]  06/19/2008   [place] SURGERY  10/2010   Patient Active Problem List   Diagnosis Date Noted   Moderate persistent asthma, uncomplicated [id number]   Perennial allergic rhinitis 12/02/2021   Recurrent infections 12/02/2021   Flexural atopic dermatitis 12/02/2021   Gastroesophageal reflux disease 12/02/2021   Essential (primary) hypertension 12/09/2016    REFERRING DIAG: Chronic right shoulder pain; Impingement syndrome of right shoulder   THERAPY DIAG:  Chronic right shoulder pain  Muscle weakness (generalized)  Rationale for Evaluation and Treatment Rehabilitation  PERTINENT HISTORY: None   PRECAUTIONS: None    SUBJECTIVE:            SUBJECTIVE STATEMENT:  Patient reports her shoulder is feeling good today, she got a massage the other day.  PAIN:  Are you having pain? Yes:   NPRS scale: 0/10 (4/10 with shoulder movement / activity) Pain location: Right shoulder Pain description: Intermittent, sharp, lightning Aggravating factors: Shoulder movement, reaching behind, overhead Relieving factors: Rest   OBJECTIVE: (objective measures completed at initial evaluation unless otherwise dated) PATIENT SURVEYS:  FOTO 58% functional status  03/02/2022: 67%  POSTURE: Rounded shoulders and forward head   CERVICAL ROM:    Active ROM A/PROM (deg) eval  Flexion WFL  Extension WFL  Right lateral flexion WFL  Left lateral flexion WFL  Right rotation [facility]  Left rotation [facility]    Patient does report right shoulder pain with cervical right side bend   UPPER EXTREMITY ROM:    Active ROM Right eval Left eval Right 02/11/22 Right 02/23/22  Shoulder flexion 135 150 145 145  Shoulder extension        Shoulder abduction        Shoulder internal rotation Reach to L1 Reach to T7 T10 T9  Shoulder external rotation 70 Reach to C7 70 Reach to T4    (Blank rows = not tested)   UPPER EXTREMITY MMT:   MMT Right eval Left eval Rt / Lt 02/18/22  Shoulder flexion 4 5   Shoulder extension       Shoulder abduction 4 5   Shoulder internal rotation 5 5   Shoulder external rotation 4 5 4 $ / 5  Middle  trapezius     4- / 4  Lower trapezius     4- / 4  Elbow flexion 5 5   Elbow extension 5 5   Grip strength (lbs)       (Blank rows = not tested)   SHOULDER SPECIAL TESTS: Impingement tests: Hawkins/Kennedy impingement test: positive   PALPATION:  Tender right upper trap, mid trap and rhomboid region, infraspinatus; referral to right anterior shoulder with mid trap palpation               TODAY'S TREATMENT:      [facility] Adult PT Treatment:                                                DATE: 03/02/2022 Therapeutic Exercise: UBE L2 x 4 min (fwd/bwd) while taking subjective Sidelying thoracic rotation x 10 each Supine thoracic extension over FR x 10 Cat cow x 5 Quadruped  thoracic extension FR roll out x 5 Seated double ER and scap retraction with green 2 x 20 Seated horizontal abduction palm up with red 2 x 15 Prone row with 3# x 10 Prone extension with 3# x 10 Prone T with 2# x 10 Manual: Right GHJ mobs inferior and posterior at various ranges of elevation Scap pinned cuff stretching   [facility] Adult PT Treatment:                                                DATE: 02/23/2022 Therapeutic Exercise: UBE L2 x 4 min (fwd/bwd) while taking subjective Sidelying thoracic rotation x 10 each Sleeper stretch 3 x 15 sec with right Sidelying cross body stretch 3 x 15 sec with right Supine pec stretch on FR at various ranges of elevation Supine horizontal abduction with green on FR 2 x 10 Supine serratus punch with 8# on FR 2 x 10 Seated double ER and scap retraction with green 2 x 10 Row with FM 17# x 10, 23# 2 x 10 Prone W with overhead reach 2 x 10  [facility] Adult PT Treatment:                                                DATE: 02/17/2022 Therapeutic Exercise: UBE L2 x 4 min (fwd/bwd) while taking subjective Sleeper stretch 3 x 20 sec with right Sidelying cross body stretch 3 x 20 sec with right Sidelying thoracic rotation x 10 each Doorway pec stretch at 90 deg 3 x 20 sec each Prone I, T, W, Y x 10 each Double ER and scap retraction with blue 2 x 10 Manual: Skilled palpation and monitoring of muscle tension while performing TPDN STM right upper trap, infraspinatus, mid tap and rhomboid Trigger Point Dry Needling Treatment: Pre-treatment instruction: Patient instructed on dry needling rationale, procedures, and possible side effects including pain during treatment (achy,cramping feeling), bruising, drop of blood, lightheadedness, nausea, sweating. Patient Consent Given: Yes Education handout provided: No Muscles treated: Right upper trap, infraspinatus, mid trap/rhomboid  Needle size and number: .30x42m x 6 Electrical stimulation performed: No Parameters:  N/A Treatment response/outcome: Twitch response elicited and  Palpable decrease in muscle tension Post-treatment instructions: Patient instructed to expect possible mild to moderate muscle soreness later today and/or tomorrow. Patient instructed in methods to reduce muscle soreness and to continue prescribed HEP. If patient was dry needled over the lung field, patient was instructed on signs and symptoms of pneumothorax and, however unlikely, to see immediate medical attention should they occur. Patient was also educated on signs and symptoms of infection and to seek medical attention should they occur. Patient verbalized understanding of these instructions and education.   PATIENT EDUCATION: Education details: HEP Person educated: Patient Education method: Education officer, environmental, Corporate treasurer cues, Verbal cues Education comprehension: verbalized understanding, returned demonstration, verbal cues required, tactile cues required, and needs further education   HOME EXERCISE PROGRAM: Access Code: [email or web address]      ASSESSMENT: CLINICAL IMPRESSION: Patient tolerated therapy well with no adverse effects. She does report an improvement in her functional ability on FOTO. Therapy focused on continued thoracic mobility and postural control. Incorporated some manual for right GHJ mobility as she reported pain at end range flexion. She did report feeling upper trap working with seated exercises so performed prone parascapular strengthening with focus on setting the shoulder blade prior to movement to avoid upper trap compensation. No changes to HEP this visit. Patient would benefit from continued skilled PT to progress her mobility and strength in order to reduce pain and maximize functional ability.    OBJECTIVE IMPAIRMENTS: decreased activity tolerance, decreased ROM, decreased strength, postural dysfunction, and pain.    ACTIVITY LIMITATIONS: carrying, lifting, sleeping, dressing, reach over head, and  hygiene/grooming   PARTICIPATION LIMITATIONS: meal prep, cleaning, laundry, shopping, and occupation   PERSONAL FACTORS: Fitness, Past/current experiences, and Time since onset of injury/illness/exacerbation are also affecting patient's functional outcome.      GOALS: Goals reviewed with patient? Yes   SHORT TERM GOALS: Target date: 02/23/2022   Patient will be I with initial HEP in order to progress with therapy. Baseline: HEP provided at eval 02/23/2022: independent Goal status: MET   2.  PT will review FOTO with patient by 3rd visit in order to understand expected progress and outcome with therapy. Baseline: FOTO assessed at eval 02/23/2022: reviewed Goal status: MET   3.  Patient will report right shoulder pain </= 4/10 with shoulder movement and activity to reduce functional limitations Baseline:  02/23/2022: 4/10 pain Goal status: MET   LONG TERM GOALS: Target date: 03/16/2022   Patient will be I with final HEP to maintain progress from PT. Baseline: HEP provided at eval Goal status: INITIAL   2.  Patient will report >/= 68% status on FOTO to indicate improved functional ability. Baseline: 58% functional status 03/02/2022: 67% Goal status: INITIAL   3.  Patient will demonstrate right shoulder elevation >/= 150 deg to improve overhead reach and completing grooming tasks Baseline: 135 deg Goal status: INITIAL   4.  Patient will demonstrate right shoulder strength >/= 5/5 MMT in order to improve lifting and performing household tasks with right arm Baseline: strength deficits noted above Goal status: INITIAL     PLAN: PT FREQUENCY: 1-2x/week   PT DURATION: 6 weeks   PLANNED INTERVENTIONS: Therapeutic exercises, Therapeutic activity, Neuromuscular re-education, Balance training, Gait training, Patient/Family education, Self Care, Joint mobilization, Joint manipulation, Aquatic Therapy, Dry Needling, Spinal manipulation, Spinal mobilization, Cryotherapy, Moist heat, Taping,  Ionotophoresis 81m/ml Dexamethasone, Manual therapy, and Re-evaluation   PLAN FOR NEXT SESSION: Review HEP and progress PRN, manual / dry needling for upper trap/infra/mid trap/rhomboid, right shoulder  stretching and mobilizations, progress rotator cuff and postural strengthening   Hilda Blades, PT, DPT, LAT, ATC 03/02/22  9:30 AM Phone: [phone] Fax: [phone]

## 2022-03-03 NOTE — Therapy (Signed)
OUTPATIENT PHYSICAL THERAPY TREATMENT NOTE   Patient Name: Tracy [name] MRN: [id number] DOB:1975/12/13, 47 y.o., female Today's Date: 03/04/2022  PCP: Horald Pollen, MD REFERRING PROVIDER: Glennon Mac, DO   END OF SESSION:   PT End of Session - 03/04/22 0857     Visit Number 8    Number of Visits 13    Date for PT Re-Evaluation 03/16/22    Authorization Type [employer]    PT Start Time [id number]    PT Stop Time 0928    PT Time Calculation (min) 42 min    Activity Tolerance Patient tolerated treatment well    Behavior During Therapy [facility] for tasks assessed/performed                   Past Medical History:  Diagnosis Date   Asthma    Breast lump    Eczema    Hypertension    Past Surgical History:  Procedure Laterality Date   BREAST LUMPECTOMY WITH RADIOACTIVE SEED LOCALIZATION Left 07/30/2020   Procedure: LEFT BREAST LUMPECTOMY WITH RADIOACTIVE SEED LOCALIZATION;  Surgeon: Erroll Luna, MD;  Location: [place];  Service: General;  Laterality: Left;   [place]  06/19/2008   [place] SURGERY  10/2010   Patient Active Problem List   Diagnosis Date Noted   Moderate persistent asthma, uncomplicated 123XX123   Perennial allergic rhinitis 12/02/2021   Recurrent infections 12/02/2021   Flexural atopic dermatitis 12/02/2021   Gastroesophageal reflux disease 12/02/2021   Essential (primary) hypertension 12/09/2016    REFERRING DIAG: Chronic right shoulder pain; Impingement syndrome of right shoulder   THERAPY DIAG:  Chronic right shoulder pain  Muscle weakness (generalized)  Rationale for Evaluation and Treatment Rehabilitation  PERTINENT HISTORY: None   PRECAUTIONS: None    SUBJECTIVE:            SUBJECTIVE STATEMENT:  Patient reports a little tightness in the right shoulder but overall doing well.  PAIN:  Are you having pain? Yes:   NPRS scale: 1/10 (4/10 with shoulder movement / activity) Pain location: Right shoulder Pain description: Intermittent, sharp, lightning Aggravating factors: Shoulder movement, reaching behind, overhead Relieving factors: Rest   OBJECTIVE: (objective measures completed at initial evaluation unless otherwise dated) PATIENT SURVEYS:  FOTO 58% functional status  03/02/2022: 67%  POSTURE: Rounded shoulders and forward head   CERVICAL ROM:    Active ROM A/PROM (deg) eval  Flexion WFL  Extension WFL  Right lateral flexion WFL  Left lateral flexion WFL  Right rotation [facility]  Left rotation [facility]    Patient does report right shoulder pain with cervical right side bend   UPPER EXTREMITY ROM:    Active ROM Right eval Left eval Right 02/11/22 Right 02/23/22  Shoulder flexion 135 150 145 145  Shoulder extension        Shoulder abduction        Shoulder internal rotation Reach to L1 Reach to T7 T10 T9  Shoulder external rotation 70 Reach to C7 70 Reach to T4    (Blank rows = not tested)   UPPER EXTREMITY MMT:   MMT Right eval Left eval Rt / Lt 02/18/22  Shoulder flexion 4 5   Shoulder extension       Shoulder abduction 4 5   Shoulder internal rotation 5 5   Shoulder external rotation 4 5 4 $ / 5  Middle trapezius  4- / 4  Lower trapezius     4- / 4  Elbow flexion 5 5   Elbow extension 5 5   Grip strength (lbs)       (Blank rows = not tested)   SHOULDER SPECIAL TESTS: Impingement tests: Hawkins/Kennedy impingement test: positive   PALPATION:  Tender right upper trap, mid trap and rhomboid region, infraspinatus; referral to right anterior shoulder with mid trap palpation               TODAY'S TREATMENT:      [facility] Adult PT Treatment:                                                DATE: 03/04/2022 Therapeutic Exercise: UBE L3 x 4 min (fwd/bwd) while taking subjective Sleeper stretch 5 x 10 sec Sidelying cross body stretch 5 x 10 sec Sidelying thoracic rotation x 10  each Supine thoracic extension over FR x 10 Cat cow x 5 Quadruped thoracic rotation with hand behind head x 5 each Bent over single arm row with 10# 3 x 10 each Seated double ER and scap retraction with green 2 x 15 Standing extension and scap retraction with green 2 x 15   [facility] Adult PT Treatment:                                                DATE: 03/02/2022 Therapeutic Exercise: UBE L2 x 4 min (fwd/bwd) while taking subjective Sidelying thoracic rotation x 10 each Supine thoracic extension over FR x 10 Cat cow x 5 Quadruped thoracic extension FR roll out x 5 Seated double ER and scap retraction with green 2 x 20 Seated horizontal abduction palm up with red 2 x 15 Prone row with 3# x 10 Prone extension with 3# x 10 Prone T with 2# x 10 Manual: Right GHJ mobs inferior and posterior at various ranges of elevation Scap pinned cuff stretching  [facility] Adult PT Treatment:                                                DATE: 02/23/2022 Therapeutic Exercise: UBE L2 x 4 min (fwd/bwd) while taking subjective Sidelying thoracic rotation x 10 each Sleeper stretch 3 x 15 sec with right Sidelying cross body stretch 3 x 15 sec with right Supine pec stretch on FR at various ranges of elevation Supine horizontal abduction with green on FR 2 x 10 Supine serratus punch with 8# on FR 2 x 10 Seated double ER and scap retraction with green 2 x 10 Row with FM 17# x 10, 23# 2 x 10 Prone W with overhead reach 2 x 10   PATIENT EDUCATION: Education details: HEP Person educated: Patient Education method: Consulting civil engineer, Demonstration, Corporate treasurer cues, Verbal cues Education comprehension: verbalized understanding, returned demonstration, verbal cues required, tactile cues required, and needs further education   HOME EXERCISE PROGRAM: Access Code: [email or web address]      ASSESSMENT: CLINICAL IMPRESSION: Patient tolerated therapy well with no adverse effects. Therapy focused on continued shoulder and spinal  mobility, and progressing postural strengthening with good  tolerance. She continues to require cueing for setting shoulder blades down and back prior to movement to avoid shrug with scapula retraction movements. She did report feeling muscles working around mid/low trap region and not the upper trap. No changes made to HEP this visit. Patient would benefit from continued skilled PT to progress her mobility and strength in order to reduce pain and maximize functional ability.    OBJECTIVE IMPAIRMENTS: decreased activity tolerance, decreased ROM, decreased strength, postural dysfunction, and pain.    ACTIVITY LIMITATIONS: carrying, lifting, sleeping, dressing, reach over head, and hygiene/grooming   PARTICIPATION LIMITATIONS: meal prep, cleaning, laundry, shopping, and occupation   PERSONAL FACTORS: Fitness, Past/current experiences, and Time since onset of injury/illness/exacerbation are also affecting patient's functional outcome.      GOALS: Goals reviewed with patient? Yes   SHORT TERM GOALS: Target date: 02/23/2022   Patient will be I with initial HEP in order to progress with therapy. Baseline: HEP provided at eval 02/23/2022: independent Goal status: MET   2.  PT will review FOTO with patient by 3rd visit in order to understand expected progress and outcome with therapy. Baseline: FOTO assessed at eval 02/23/2022: reviewed Goal status: MET   3.  Patient will report right shoulder pain </= 4/10 with shoulder movement and activity to reduce functional limitations Baseline:  02/23/2022: 4/10 pain Goal status: MET   LONG TERM GOALS: Target date: 03/16/2022   Patient will be I with final HEP to maintain progress from PT. Baseline: HEP provided at eval Goal status: INITIAL   2.  Patient will report >/= 68% status on FOTO to indicate improved functional ability. Baseline: 58% functional status 03/02/2022: 67% Goal status: PARTIALLY MET   3.  Patient will demonstrate right shoulder  elevation >/= 150 deg to improve overhead reach and completing grooming tasks Baseline: 135 deg Goal status: INITIAL   4.  Patient will demonstrate right shoulder strength >/= 5/5 MMT in order to improve lifting and performing household tasks with right arm Baseline: strength deficits noted above Goal status: INITIAL     PLAN: PT FREQUENCY: 1-2x/week   PT DURATION: 6 weeks   PLANNED INTERVENTIONS: Therapeutic exercises, Therapeutic activity, Neuromuscular re-education, Balance training, Gait training, Patient/Family education, Self Care, Joint mobilization, Joint manipulation, Aquatic Therapy, Dry Needling, Spinal manipulation, Spinal mobilization, Cryotherapy, Moist heat, Taping, Ionotophoresis 89m/ml Dexamethasone, Manual therapy, and Re-evaluation   PLAN FOR NEXT SESSION: Review HEP and progress PRN, manual / dry needling for upper trap/infra/mid trap/rhomboid, right shoulder stretching and mobilizations, progress rotator cuff and postural strengthening   CHilda Blades PT, DPT, LAT, ATC 03/04/22  9:32 AM Phone: [phone]: [phone]

## 2022-03-04 ENCOUNTER — Ambulatory Visit: Payer: No Typology Code available for payment source | Admitting: Physical Therapy

## 2022-03-04 ENCOUNTER — Other Ambulatory Visit: Payer: Self-pay

## 2022-03-04 ENCOUNTER — Encounter: Payer: Self-pay | Admitting: Physical Therapy

## 2022-03-04 DIAGNOSIS — M6281 Muscle weakness (generalized): Secondary | ICD-10-CM

## 2022-03-04 DIAGNOSIS — M25511 Pain in right shoulder: Secondary | ICD-10-CM | POA: Diagnosis not present

## 2022-03-04 DIAGNOSIS — G8929 Other chronic pain: Secondary | ICD-10-CM
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
## 2022-03-08 ENCOUNTER — Ambulatory Visit (INDEPENDENT_AMBULATORY_CARE_PROVIDER_SITE_OTHER): Payer: No Typology Code available for payment source | Admitting: Sports Medicine

## 2022-03-08 VITALS — BP 122/82 | Ht 62.0 in | Wt 175.0 lb

## 2022-03-08 DIAGNOSIS — G8929 Other chronic pain: Secondary | ICD-10-CM

## 2022-03-08 DIAGNOSIS — M7541 Impingement syndrome of right shoulder: Secondary | ICD-10-CM | POA: Diagnosis not present

## 2022-03-08 DIAGNOSIS — M25511 Pain in right shoulder: Secondary | ICD-10-CM

## 2022-03-08 MED ORDER — MELOXICAM 15 MG PO TABS: 15.0000 mg | ORAL_TABLET | Freq: Every day | ORAL | 0 refills | Status: DC

## 2022-03-08 NOTE — Patient Instructions (Addendum)
Good to see you - Start meloxicam 15 mg daily x2 weeks.  If still having pain after 2 weeks, complete 3rd-week of meloxicam. May use remaining meloxicam as needed once daily for pain control.  Do not to use additional NSAIDs while taking meloxicam.  May use Tylenol [phone] mg 2 to 3 times a day for breakthrough pain. Continue HEP and PT 4 week follow up

## 2022-03-09 NOTE — Therapy (Signed)
OUTPATIENT PHYSICAL THERAPY TREATMENT NOTE   Patient Name: Tracy [name] MRN: [id number] DOB:07/24/1975, 47 y.o., female Today's Date: 03/12/2022  PCP: Horald Pollen, MD REFERRING PROVIDER: Glennon Mac, DO   END OF SESSION:   PT End of Session - 03/12/22 0822     Visit Number 9    Number of Visits 15    Date for PT Re-Evaluation 04/23/22    Authorization Type [employer]et[employer]    PT Start Time 0818    PT Stop Time 0900    PT Time Calculation (min) 42 min    Activity Tolerance Patient tolerated treatment well    Behavior During Therapy WFL for tasks assessed/performed                    Past Medical History:  Diagnosis Date   Asthma    Breast lump    Eczema    Hypertension    Past Surgical History:  Procedure Laterality Date   BREAST LUMPECTOMY WITH RADIOACTIVE SEED LOCALIZATION Left 07/30/2020   Procedure: LEFT BREAST LUMPECTOMY WITH RADIOACTIVE SEED LOCALIZATION;  Surgeon: Erroll Luna, MD;  Location: [place];  Service: General;  Laterality: Left;   [place]  06/19/2008   [place] SURGERY  10/2010   Patient Active Problem List   Diagnosis Date Noted   Moderate persistent asthma, uncomplicated 123XX123   Perennial allergic rhinitis 12/02/2021   Recurrent infections 12/02/2021   Flexural atopic dermatitis 12/02/2021   Gastroesophageal reflux disease 12/02/2021   Essential (primary) hypertension 12/09/2016    REFERRING DIAG: Chronic right shoulder pain; Impingement syndrome of right shoulder   THERAPY DIAG:  Chronic right shoulder pain  Muscle weakness (generalized)  Rationale for Evaluation and Treatment Rehabilitation  PERTINENT HISTORY: None   PRECAUTIONS: None    SUBJECTIVE:            SUBJECTIVE STATEMENT:  Patient reports shoulder is doing ok, she did have some pain in it last night.   PAIN:  Are you having pain? Yes:   NPRS scale: 1/10 (4/10 with shoulder movement / activity) Pain location: Right shoulder Pain description: Intermittent, sharp, lightning Aggravating factors: Shoulder movement, reaching behind, overhead Relieving factors: Rest   OBJECTIVE: (objective measures completed at initial evaluation unless otherwise dated) PATIENT SURVEYS:  FOTO 58% functional status  03/02/2022: 67%  POSTURE: Rounded shoulders and forward head   CERVICAL ROM:    Active ROM A/PROM (deg) eval  Flexion WFL  Extension WFL  Right lateral flexion WFL  Left lateral flexion WFL  Right rotation [facility]  Left rotation [facility]    Patient does report right shoulder pain with cervical right side bend   UPPER EXTREMITY ROM:    Active ROM Right eval Left eval Right 02/11/22 Right 02/23/22 Right 03/12/22  Shoulder flexion 135 150 145 145 145  Shoulder extension         Shoulder abduction         Shoulder internal rotation Reach to L1 Reach to T7 T10 T9   Shoulder external rotation 70 Reach to C7 70 Reach to T4     (Blank rows = not tested)   UPPER EXTREMITY MMT:   MMT Right eval Left eval Rt / Lt 02/18/22 Rt 03/12/2022  Shoulder flexion 4 5    Shoulder extension        Shoulder abduction 4 5    Shoulder internal  rotation 5 5    Shoulder external rotation '4 5 4 '$ / 5 4+  Middle trapezius     4- / 4 4-  Lower trapezius     4- / 4 4-  Elbow flexion 5 5    Elbow extension 5 5    Grip strength (lbs)        (Blank rows = not tested)   SHOULDER SPECIAL TESTS: Impingement tests: Hawkins/Kennedy impingement test: positive   PALPATION:  Tender right upper trap, mid trap and rhomboid region, infraspinatus; referral to right anterior shoulder with mid trap palpation               TODAY'S TREATMENT:      [facility] Adult PT Treatment:                                                DATE: 03/09/2022 Therapeutic Exercise: UBE L3 x 4 min (fwd/bwd) while taking subjective Sleeper stretch 3 x 20 sec Sidelying cross body  stretch 3 x 20 sec Supine thoracic extension over FR at various ranges Sidelying shoulder ER with 5# 3 x 10 on right Bent over single arm row with 15# 3 x 10 on right Manual: Skilled palpation and monitoring of muscle tension while performing TPDN STM right upper trap, infraspinatus, mid tap and rhomboid Trigger Point Dry Needling Treatment: Pre-treatment instruction: Patient instructed on dry needling rationale, procedures, and possible side effects including pain during treatment (achy,cramping feeling), bruising, drop of blood, lightheadedness, nausea, sweating. Patient Consent Given: Yes Education handout provided: No Muscles treated: Right upper trap, infraspinatus, mid trap/rhomboid  Needle size and number: .30x50m x 6 Electrical stimulation performed: No Parameters: N/A Treatment response/outcome: Twitch response elicited and Palpable decrease in muscle tension Post-treatment instructions: Patient instructed to expect possible mild to moderate muscle soreness later today and/or tomorrow. Patient instructed in methods to reduce muscle soreness and to continue prescribed HEP. If patient was dry needled over the lung field, patient was instructed on signs and symptoms of pneumothorax and, however unlikely, to see immediate medical attention should they occur. Patient was also educated on signs and symptoms of infection and to seek medical attention should they occur. Patient verbalized understanding of these instructions and education.   [facility]                                                [facility]: 03[facility]20[facility] Therapeutic Exercise: UBE L3 x 4 min (fwd/bwd) while taking subjective Sleeper stretch 5 x 10 sec Sidelying cross body stretch 5 x 10 sec Sidelying thoracic rotation x 10 each Supine thoracic extension over FR x 10 Cat cow x 5 Quadruped thoracic rotation with hand behind head x 5 each Bent over single arm row with 10# 3 x 10 each Seated double ER and scap  retraction with green 2 x 15 Standing extension and scap retraction with green 2 x 15  [facility] Adult PT Treatment:                                                DATE:  03/02/2022 Therapeutic Exercise: UBE L2 x 4 min (fwd/bwd) while taking subjective Sidelying thoracic rotation x 10 each Supine thoracic extension over FR x 10 Cat cow x 5 Quadruped thoracic extension FR roll out x 5 Seated double ER and scap retraction with green 2 x 20 Seated horizontal abduction palm up with red 2 x 15 Prone row with 3# x 10 Prone extension with 3# x 10 Prone T with 2# x 10 Manual: Right GHJ mobs inferior and posterior at various ranges of elevation Scap pinned cuff stretching   PATIENT EDUCATION: Education details: POC update, HEP Person educated: Patient Education method: Consulting civil engineer, Demonstration, Tactile cues, Verbal cues Education comprehension: verbalized understanding, returned demonstration, verbal cues required, tactile cues required, and needs further education   HOME EXERCISE PROGRAM: Access Code: [email or web address]      ASSESSMENT: CLINICAL IMPRESSION: Patient tolerated therapy well with no adverse effects. She is making progress in therapy, demonstrating improved right shoulder motion and strength, but she does continue to report right shoulder discomfort with activity and limitations in her functional ability. Therapy continues to focus on progressing spinal and shoulder mobility, and rotator cuff and periscapular strength with good tolerance. Continued with TPDN this visit with patient reporting good therapeutic benefit. She does require cueing for scapular control and posture with exercises. No changes to her HEP this visit. Patient would benefit from continued skilled PT to progress her mobility and strength in order to reduce pain and maximize functional ability, so will extend PT POC for 6 more weeks.    OBJECTIVE IMPAIRMENTS: decreased activity tolerance, decreased ROM, decreased strength,  postural dysfunction, and pain.    ACTIVITY LIMITATIONS: carrying, lifting, sleeping, dressing, reach over head, and hygiene/grooming   PARTICIPATION LIMITATIONS: meal prep, cleaning, laundry, shopping, and occupation   PERSONAL FACTORS: Fitness, Past/current experiences, and Time since onset of injury/illness/exacerbation are also affecting patient's functional outcome.      GOALS: Goals reviewed with patient? Yes   SHORT TERM GOALS: Target date: 02/23/2022   Patient will be I with initial HEP in order to progress with therapy. Baseline: HEP provided at eval 02/23/2022: independent Goal status: MET   2.  PT will review FOTO with patient by 3rd visit in order to understand expected progress and outcome with therapy. Baseline: FOTO assessed at eval 02/23/2022: reviewed Goal status: MET   3.  Patient will report right shoulder pain </= 4/10 with shoulder movement and activity to reduce functional limitations Baseline:  02/23/2022: 4/10 pain Goal status: MET   LONG TERM GOALS: Target date: 04/23/2022   Patient will be I with final HEP to maintain progress from PT. Baseline: HEP provided at eval 03/12/2022: progressing Goal status: ONGOING   2.  Patient will report >/= 68% status on FOTO to indicate improved functional ability. Baseline: 58% functional status 03/02/2022: 67% Goal status: PARTIALLY MET   3.  Patient will demonstrate right shoulder elevation >/= 150 deg to improve overhead reach and completing grooming tasks Baseline: 135 deg 03/12/2022: 145 Goal status: PARTIALLY MET   4.  Patient will demonstrate right shoulder strength >/= 5/5 MMT in order to improve lifting and performing household tasks with right arm Baseline: strength deficits noted above 03/12/2022: 4+/5 MMT Goal status: PARTIALLY MET     PLAN: PT FREQUENCY: 1x/week   PT DURATION: 6 weeks   PLANNED INTERVENTIONS: Therapeutic exercises, Therapeutic activity, Neuromuscular re-education, Balance training,  Gait training, Patient/Family education, Self Care, Joint mobilization, Joint manipulation, Aquatic Therapy, Dry Needling, Spinal manipulation, Spinal mobilization, Cryotherapy,  Moist heat, Taping, Ionotophoresis '4mg'$ /ml Dexamethasone, Manual therapy, and Re-evaluation   PLAN FOR NEXT SESSION: Review HEP and progress PRN, manual / dry needling for upper trap/infra/mid trap/rhomboid, right shoulder stretching and mobilizations, progress rotator cuff and postural strengthening   Hilda Blades, PT, DPT, LAT, ATC 03/12/22  9:03 AM Phone: [phone] Fax: [phone]

## 2022-03-12 ENCOUNTER — Ambulatory Visit: Payer: No Typology Code available for payment source | Admitting: Physical Therapy

## 2022-03-12 ENCOUNTER — Encounter: Payer: Self-pay | Admitting: Physical Therapy

## 2022-03-12 ENCOUNTER — Other Ambulatory Visit: Payer: Self-pay

## 2022-03-12 DIAGNOSIS — M25511 Pain in right shoulder: Secondary | ICD-10-CM | POA: Diagnosis not present

## 2022-03-12 DIAGNOSIS — G8929 Other chronic pain: Secondary | ICD-10-CM

## 2022-03-12 DIAGNOSIS — M6281 Muscle weakness (generalized): Secondary | ICD-10-CM

## 2022-03-14 NOTE — Therapy (Signed)
OUTPATIENT PHYSICAL THERAPY TREATMENT NOTE   Patient Name: Tracy [name] MRN: [id number] DOB:May 23, 1975, 47 y.o., female Today's Date: 03/15/2022  PCP: Horald Pollen, MD REFERRING PROVIDER: Glennon Mac, DO   END OF SESSION:   PT End of Session - 03/15/22 1410     Visit Number 10    Number of Visits 15    Date for PT Re-Evaluation 04/23/22    Authorization Type [employer]    PT Start Time 1401    PT Stop Time [id number]    PT Time Calculation (min) 44 min    Activity Tolerance Patient tolerated treatment well    Behavior During Therapy WFL for tasks assessed/performed                     Past Medical History:  Diagnosis Date   Asthma    Breast lump    Eczema    Hypertension    Past Surgical History:  Procedure Laterality Date   BREAST LUMPECTOMY WITH RADIOACTIVE SEED LOCALIZATION Left 07/30/2020   Procedure: LEFT BREAST LUMPECTOMY WITH RADIOACTIVE SEED LOCALIZATION;  Surgeon: Erroll Luna, MD;  Location: [place];  Service: General;  Laterality: Left;   [place]  06/19/2008   [place] SURGERY  10/2010   Patient Active Problem List   Diagnosis Date Noted   Moderate persistent asthma, uncomplicated 123XX123   Perennial allergic rhinitis 12/02/2021   Recurrent infections 12/02/2021   Flexural atopic dermatitis 12/02/2021   Gastroesophageal reflux disease 12/02/2021   Essential (primary) hypertension 12/09/2016    REFERRING DIAG: Chronic right shoulder pain; Impingement syndrome of right shoulder   THERAPY DIAG:  Chronic right shoulder pain  Muscle weakness (generalized)  Rationale for Evaluation and Treatment Rehabilitation  PERTINENT HISTORY: None   PRECAUTIONS: None    SUBJECTIVE:            SUBJECTIVE STATEMENT:  Patient reports shoulder is alright. Last night she was working on the computer and her arm kept going to  sleep.  PAIN:  Are you having pain? Yes:  NPRS scale: 2/10 (4/10 with shoulder movement / activity) Pain location: Right shoulder Pain description: Intermittent, sharp, lightning Aggravating factors: Shoulder movement, reaching behind, overhead Relieving factors: Rest   OBJECTIVE: (objective measures completed at initial evaluation unless otherwise dated) PATIENT SURVEYS:  FOTO 58% functional status  03/02/2022: 67%  POSTURE: Rounded shoulders and forward head   CERVICAL ROM:    Active ROM A/PROM (deg) eval  Flexion WFL  Extension WFL  Right lateral flexion WFL  Left lateral flexion WFL  Right rotation [facility]  Left rotation [facility]    Patient does report right shoulder pain with cervical right side bend   UPPER EXTREMITY ROM:    Active ROM Right eval Left eval Right 02/11/22 Right 02/23/22 Right 03/12/22  Shoulder flexion 135 150 145 145 145  Shoulder extension         Shoulder abduction         Shoulder internal rotation Reach to L1 Reach to T7 T10 T9   Shoulder external rotation 70 Reach to C7 70 Reach to T4     (Blank rows = not tested)   UPPER EXTREMITY MMT:   MMT Right eval Left eval Rt / Lt 02/18/22 Rt 03/12/2022  Shoulder flexion 4 5    Shoulder extension        Shoulder abduction 4 5  Shoulder internal rotation 5 5    Shoulder external rotation '4 5 4 '$ / 5 4+  Middle trapezius     4- / 4 4-  Lower trapezius     4- / 4 4-  Elbow flexion 5 5    Elbow extension 5 5    Grip strength (lbs)        (Blank rows = not tested)   SHOULDER SPECIAL TESTS: Impingement tests: Hawkins/Kennedy impingement test: positive   PALPATION:  Tender right upper trap, mid trap and rhomboid region, infraspinatus; referral to right anterior shoulder with mid trap palpation               TODAY'S TREATMENT:      [facility] Adult PT Treatment:                                                DATE: 03/15/2022 Therapeutic Exercise: UBE L3 x 4 min (fwd/bwd) while taking  subjective Quadruped shoulder flexion and thoracic extension stretch x 5 Sidelying shoulder ER with 5# 3 x 10 on right Sidelying horizontal abduction with 3# 3 x 10 on right Low row from high anchor point using FM 13# 2 x 10 on right Supine serratus punch with 8# 2 x 10 on right Manual: Right GHJ mobs at various ranges of elevation Scap pinned shoulder elevation and cross body stretching   [facility] Adult PT Treatment:                                                DATE: 03/12/2022 Therapeutic Exercise: UBE L3 x 4 min (fwd/bwd) while taking subjective Sleeper stretch 3 x 20 sec Sidelying cross body stretch 3 x 20 sec Supine thoracic extension over FR at various ranges Sidelying shoulder ER with 5# 3 x 10 on right Bent over single arm row with 15# 3 x 10 on right Manual: Skilled palpation and monitoring of muscle tension while performing TPDN STM right upper trap, infraspinatus, mid tap and rhomboid Trigger Point Dry Needling Treatment: Pre-treatment instruction: Patient instructed on dry needling rationale, procedures, and possible side effects including pain during treatment (achy,cramping feeling), bruising, drop of blood, lightheadedness, nausea, sweating. Patient Consent Given: Yes Education handout provided: No Muscles treated: Right upper trap, infraspinatus, mid trap/rhomboid  Needle size and number: .30x69m x 6 Electrical stimulation performed: No Parameters: N/A Treatment response/outcome: Twitch response elicited and Palpable decrease in muscle tension Post-treatment instructions: Patient instructed to expect possible mild to moderate muscle soreness later today and/or tomorrow. Patient instructed in methods to reduce muscle soreness and to continue prescribed HEP. If patient was dry needled over the lung field, patient was instructed on signs and symptoms of pneumothorax and, however unlikely, to see immediate medical attention should they occur. Patient was also educated on  signs and symptoms of infection and to seek medical attention should they occur. Patient verbalized understanding of these instructions and education.  [facility]                                                DATE: 03/04/2022  Therapeutic Exercise: UBE L3 x 4 min (fwd/bwd) while taking subjective Sleeper stretch 5 x 10 sec Sidelying cross body stretch 5 x 10 sec Sidelying thoracic rotation x 10 each Supine thoracic extension over FR x 10 Cat cow x 5 Quadruped thoracic rotation with hand behind head x 5 each Bent over single arm row with 10# 3 x 10 each Seated double ER and scap retraction with green 2 x 15 Standing extension and scap retraction with green 2 x 15   PATIENT EDUCATION: Education details: HEP Person educated: Patient Education method: Consulting civil engineer, Demonstration, Corporate treasurer cues, Verbal cues Education comprehension: verbalized understanding, returned demonstration, verbal cues required, tactile cues required, and needs further education   HOME EXERCISE PROGRAM: Access Code: [email or web address]      ASSESSMENT: CLINICAL IMPRESSION: Patient tolerated therapy well with no adverse effects. Therapy focused on working on right shoulder mobility and progressing her rotator cuff and periscapular strengthening. She was able to tolerate strengthening well but does report muscle fatigue with exercises. She does require cueing for scapular control with exercises to avoid shrug. No changes to HEP made this visit. Patient would benefit from continued skilled PT to progress her mobility and strength in order to reduce pain and maximize functional ability.    OBJECTIVE IMPAIRMENTS: decreased activity tolerance, decreased ROM, decreased strength, postural dysfunction, and pain.    ACTIVITY LIMITATIONS: carrying, lifting, sleeping, dressing, reach over head, and hygiene/grooming   PARTICIPATION LIMITATIONS: meal prep, cleaning, laundry, shopping, and occupation   PERSONAL FACTORS:  Fitness, Past/current experiences, and Time since onset of injury/illness/exacerbation are also affecting patient's functional outcome.      GOALS: Goals reviewed with patient? Yes   SHORT TERM GOALS: Target date: 02/23/2022   Patient will be I with initial HEP in order to progress with therapy. Baseline: HEP provided at eval 02/23/2022: independent Goal status: MET   2.  PT will review FOTO with patient by 3rd visit in order to understand expected progress and outcome with therapy. Baseline: FOTO assessed at eval 02/23/2022: reviewed Goal status: MET   3.  Patient will report right shoulder pain </= 4/10 with shoulder movement and activity to reduce functional limitations Baseline:  02/23/2022: 4/10 pain Goal status: MET   LONG TERM GOALS: Target date: 04/23/2022   Patient will be I with final HEP to maintain progress from PT. Baseline: HEP provided at eval 03/12/2022: progressing Goal status: ONGOING   2.  Patient will report >/= 68% status on FOTO to indicate improved functional ability. Baseline: 58% functional status 03/02/2022: 67% Goal status: PARTIALLY MET   3.  Patient will demonstrate right shoulder elevation >/= 150 deg to improve overhead reach and completing grooming tasks Baseline: 135 deg 03/12/2022: 145 Goal status: PARTIALLY MET   4.  Patient will demonstrate right shoulder strength >/= 5/5 MMT in order to improve lifting and performing household tasks with right arm Baseline: strength deficits noted above 03/12/2022: 4+/5 MMT Goal status: PARTIALLY MET     PLAN: PT FREQUENCY: 1x/week   PT DURATION: 6 weeks   PLANNED INTERVENTIONS: Therapeutic exercises, Therapeutic activity, Neuromuscular re-education, Balance training, Gait training, Patient/Family education, Self Care, Joint mobilization, Joint manipulation, Aquatic Therapy, Dry Needling, Spinal manipulation, Spinal mobilization, Cryotherapy, Moist heat, Taping, Ionotophoresis '4mg'$ /ml Dexamethasone, Manual  therapy, and Re-evaluation   PLAN FOR NEXT SESSION: Review HEP and progress PRN, manual / dry needling for upper trap/infra/mid trap/rhomboid, right shoulder stretching and mobilizations, progress rotator cuff and postural strengthening   Hilda Blades, PT, DPT, LAT, ATC  03/15/22  2:47 PM Phone: [phone] Fax: [phone]

## 2022-03-15 ENCOUNTER — Other Ambulatory Visit: Payer: Self-pay

## 2022-03-15 ENCOUNTER — Ambulatory Visit: Payer: No Typology Code available for payment source | Admitting: Physical Therapy

## 2022-03-15 ENCOUNTER — Encounter: Payer: Self-pay | Admitting: Physical Therapy

## 2022-03-15 DIAGNOSIS — G8929 Other chronic pain: Secondary | ICD-10-CM

## 2022-03-15 DIAGNOSIS — M25511 Pain in right shoulder: Secondary | ICD-10-CM | POA: Diagnosis not present

## 2022-03-15 DIAGNOSIS — M6281 Muscle weakness (generalized): Secondary | ICD-10-CM

## 2022-03-23 NOTE — Therapy (Signed)
OUTPATIENT PHYSICAL THERAPY TREATMENT NOTE   Patient Name: Tracy [name] MRN: [id number] DOB:Jul 13, 1975, 47 y.o., female Today's Date: 03/24/2022  PCP: Horald Pollen, MD REFERRING PROVIDER: Glennon Mac, DO   END OF SESSION:   PT End of Session - 03/24/22 0930     Visit Number 11    Number of Visits 15    Date for PT Re-Evaluation 04/23/22    Authorization Type [employer]    PT Start Time [phone]    PT Stop Time 1015    PT Time Calculation (min) 47 min    Activity Tolerance Patient tolerated treatment well    Behavior During Therapy WFL for tasks assessed/performed                      Past Medical History:  Diagnosis Date   Asthma    Breast lump    Eczema    Hypertension    Past Surgical History:  Procedure Laterality Date   BREAST LUMPECTOMY WITH RADIOACTIVE SEED LOCALIZATION Left 07/30/2020   Procedure: LEFT BREAST LUMPECTOMY WITH RADIOACTIVE SEED LOCALIZATION;  Surgeon: Erroll Luna, MD;  Location: [place];  Service: General;  Laterality: Left;   [place]  06/19/2008   [place] SURGERY  10/2010   Patient Active Problem List   Diagnosis Date Noted   Moderate persistent asthma, uncomplicated 123XX123   Perennial allergic rhinitis 12/02/2021   Recurrent infections 12/02/2021   Flexural atopic dermatitis 12/02/2021   Gastroesophageal reflux disease 12/02/2021   Essential (primary) hypertension 12/09/2016    REFERRING DIAG: Chronic right shoulder pain; Impingement syndrome of right shoulder   THERAPY DIAG:  Chronic right shoulder pain  Muscle weakness (generalized)  Rationale for Evaluation and Treatment Rehabilitation  PERTINENT HISTORY: None   PRECAUTIONS: None    SUBJECTIVE:            SUBJECTIVE STATEMENT:  Patient reports the shoulder is feeling ok, yesterday she had a bad crook in her neck which upset her shoulder. She  states she can still feel it in her neck but other than that her shoulder is fine. She was able to scratch her back without any pain the other day which is improvement.   PAIN:  Are you having pain? Yes:  NPRS scale: 0/10 (4/10 with shoulder movement / activity) Pain location: Right shoulder Pain description: Intermittent, sharp, lightning Aggravating factors: Shoulder movement, reaching behind, overhead Relieving factors: Rest   OBJECTIVE: (objective measures completed at initial evaluation unless otherwise dated) PATIENT SURVEYS:  FOTO 58% functional status  03/02/2022: 67%  POSTURE: Rounded shoulders and forward head   CERVICAL ROM:    Active ROM A/PROM (deg) eval  Flexion WFL  Extension WFL  Right lateral flexion WFL  Left lateral flexion WFL  Right rotation [facility]  Left rotation [facility]    Patient does report right shoulder pain with cervical right side bend   UPPER EXTREMITY ROM:    Active ROM Right eval Left eval Right 02/11/22 Right 02/23/22 Right 03/12/22 Right 03/24/2022  Shoulder flexion 135 150 145 145 145   Shoulder extension          Shoulder abduction          Shoulder internal rotation Reach to L1 Reach to T7 T10 T9  T8  Shoulder external rotation 70 Reach to C7 70 Reach to T4      (Blank  rows = not tested)   UPPER EXTREMITY MMT:   MMT Right eval Left eval Rt / Lt 02/18/22 Rt 03/12/2022  Shoulder flexion 4 5    Shoulder extension        Shoulder abduction 4 5    Shoulder internal rotation 5 5    Shoulder external rotation '4 5 4 '$ / 5 4+  Middle trapezius     4- / 4 4-  Lower trapezius     4- / 4 4-  Elbow flexion 5 5    Elbow extension 5 5    Grip strength (lbs)        (Blank rows = not tested)   SHOULDER SPECIAL TESTS: Impingement tests: Hawkins/Kennedy impingement test: positive   PALPATION:  Tender right upper trap, mid trap and rhomboid region, infraspinatus; referral to right anterior shoulder with mid trap palpation               TODAY'S  TREATMENT:      [facility] Adult PT Treatment:                                                DATE: 03/23/2022 Therapeutic Exercise: UBE L3 x 4 min (fwd/bwd) while taking subjective Supine serratus punch with 8# 2 x 15 Supine horizontal abduction with blue 2 x 10 Standing barbell landmine shoulder press with serratus punch 2 x 10 each Shoulder ER with green 2 x 15 on right Single arm row with FM 13# 2 x 10 Manual: Skilled palpation and monitoring of muscle tension while performing TPDN STM right upper trap Right GHJ mobs at various ranges of elevation Trigger Point Dry Needling Treatment: Pre-treatment instruction: Patient instructed on dry needling rationale, procedures, and possible side effects including pain during treatment (achy,cramping feeling), bruising, drop of blood, lightheadedness, nausea, sweating. Patient Consent Given: Yes Education handout provided: No Muscles treated: Right upper trap Needle size and number: .30x79m x 3 Electrical stimulation performed: No Parameters: N/A Treatment response/outcome: Twitch response elicited and Palpable decrease in muscle tension Post-treatment instructions: Patient instructed to expect possible mild to moderate muscle soreness later today and/or tomorrow. Patient instructed in methods to reduce muscle soreness and to continue prescribed HEP. If patient was dry needled over the lung field, patient was instructed on signs and symptoms of pneumothorax and, however unlikely, to see immediate medical attention should they occur. Patient was also educated on signs and symptoms of infection and to seek medical attention should they occur. Patient verbalized understanding of these instructions and education.   [facility]                                                DATE: 03/15/2022 Therapeutic Exercise: UBE L3 x 4 min (fwd/bwd) while taking subjective Quadruped shoulder flexion and thoracic extension stretch x 5 Sidelying shoulder ER  with 5# 3 x 10 on right Sidelying horizontal abduction with 3# 3 x 10 on right Low row from high anchor point using FM 13# 2 x 10 on right Supine serratus punch with 8# 2 x 10 on right Manual: Right GHJ mobs at various ranges of elevation Scap pinned shoulder elevation and cross body stretching  [facility] Adult PT Treatment:  DATE: 03/12/2022 Therapeutic Exercise: UBE L3 x 4 min (fwd/bwd) while taking subjective Sleeper stretch 3 x 20 sec Sidelying cross body stretch 3 x 20 sec Supine thoracic extension over FR at various ranges Sidelying shoulder ER with 5# 3 x 10 on right Bent over single arm row with 15# 3 x 10 on right Manual: Skilled palpation and monitoring of muscle tension while performing TPDN STM right upper trap, infraspinatus, mid tap and rhomboid Trigger Point Dry Needling Treatment: Pre-treatment instruction: Patient instructed on dry needling rationale, procedures, and possible side effects including pain during treatment (achy,cramping feeling), bruising, drop of blood, lightheadedness, nausea, sweating. Patient Consent Given: Yes Education handout provided: No Muscles treated: Right upper trap, infraspinatus, mid trap/rhomboid  Needle size and number: .30x53m x 6 Electrical stimulation performed: No Parameters: N/A Treatment response/outcome: Twitch response elicited and Palpable decrease in muscle tension Post-treatment instructions: Patient instructed to expect possible mild to moderate muscle soreness later today and/or tomorrow. Patient instructed in methods to reduce muscle soreness and to continue prescribed HEP. If patient was dry needled over the lung field, patient was instructed on signs and symptoms of pneumothorax and, however unlikely, to see immediate medical attention should they occur. Patient was also educated on signs and symptoms of infection and to seek medical attention should they occur. Patient verbalized  understanding of these instructions and education.   PATIENT EDUCATION: Education details: HEP Person educated: Patient Education method: EEducation officer, environmental TCorporate treasurercues, Verbal cues Education comprehension: verbalized understanding, returned demonstration, verbal cues required, tactile cues required, and needs further education   HOME EXERCISE PROGRAM: Access Code: [email or web address]      ASSESSMENT: CLINICAL IMPRESSION: Patient tolerated therapy well with no adverse effects. Therapy focused on continued progression of right shoulder mobility and strengthening. Performed TPDN for right upper trap as she reported crook in her neck affecting right shoulder. She does demonstrate improved shoulder IR reach behind her back without an increase in pain. She is progressing well with her strengthening but does occasionally require cueing for posture and scapular control. No changes to HEP this visit. Patient would benefit from continued skilled PT to progress her mobility and strength in order to reduce pain and maximize functional ability.    OBJECTIVE IMPAIRMENTS: decreased activity tolerance, decreased ROM, decreased strength, postural dysfunction, and pain.    ACTIVITY LIMITATIONS: carrying, lifting, sleeping, dressing, reach over head, and hygiene/grooming   PARTICIPATION LIMITATIONS: meal prep, cleaning, laundry, shopping, and occupation   PERSONAL FACTORS: Fitness, Past/current experiences, and Time since onset of injury/illness/exacerbation are also affecting patient's functional outcome.      GOALS: Goals reviewed with patient? Yes   SHORT TERM GOALS: Target date: 02/23/2022   Patient will be I with initial HEP in order to progress with therapy. Baseline: HEP provided at eval 02/23/2022: independent Goal status: MET   2.  PT will review FOTO with patient by 3rd visit in order to understand expected progress and outcome with therapy. Baseline: FOTO assessed at eval 02/23/2022:  reviewed Goal status: MET   3.  Patient will report right shoulder pain </= 4/10 with shoulder movement and activity to reduce functional limitations Baseline:  02/23/2022: 4/10 pain Goal status: MET   LONG TERM GOALS: Target date: 04/23/2022   Patient will be I with final HEP to maintain progress from PT. Baseline: HEP provided at eval 03/12/2022: progressing Goal status: ONGOING   2.  Patient will report >/= 68% status on FOTO to indicate improved functional ability. Baseline: 58% functional status 03/02/2022:  67% Goal status: PARTIALLY MET   3.  Patient will demonstrate right shoulder elevation >/= 150 deg to improve overhead reach and completing grooming tasks Baseline: 135 deg 03/12/2022: 145 Goal status: PARTIALLY MET   4.  Patient will demonstrate right shoulder strength >/= 5/5 MMT in order to improve lifting and performing household tasks with right arm Baseline: strength deficits noted above 03/12/2022: 4+/5 MMT Goal status: PARTIALLY MET     PLAN: PT FREQUENCY: 1x/week   PT DURATION: 6 weeks   PLANNED INTERVENTIONS: Therapeutic exercises, Therapeutic activity, Neuromuscular re-education, Balance training, Gait training, Patient/Family education, Self Care, Joint mobilization, Joint manipulation, Aquatic Therapy, Dry Needling, Spinal manipulation, Spinal mobilization, Cryotherapy, Moist heat, Taping, Ionotophoresis '4mg'$ /ml Dexamethasone, Manual therapy, and Re-evaluation   PLAN FOR NEXT SESSION: Review HEP and progress PRN, manual / dry needling for upper trap/infra/mid trap/rhomboid, right shoulder stretching and mobilizations, progress rotator cuff and postural strengthening   Hilda Blades, PT, DPT, LAT, ATC 03/24/22  10:21 AM Phone: [phone] Fax: [phone]

## 2022-03-24 ENCOUNTER — Encounter: Payer: Self-pay | Admitting: Physical Therapy

## 2022-03-24 ENCOUNTER — Other Ambulatory Visit: Payer: Self-pay

## 2022-03-24 ENCOUNTER — Ambulatory Visit: Payer: No Typology Code available for payment source | Attending: Sports Medicine | Admitting: Physical Therapy

## 2022-03-24 DIAGNOSIS — M6281 Muscle weakness (generalized): Secondary | ICD-10-CM | POA: Diagnosis present

## 2022-03-24 DIAGNOSIS — G8929 Other chronic pain: Secondary | ICD-10-CM | POA: Insufficient documentation

## 2022-03-24 DIAGNOSIS — M25511 Pain in right shoulder: Secondary | ICD-10-CM | POA: Diagnosis not present

## 2022-03-29 ENCOUNTER — Other Ambulatory Visit: Payer: Self-pay | Admitting: Emergency Medicine

## 2022-03-29 DIAGNOSIS — I10 Essential (primary) hypertension: Secondary | ICD-10-CM
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
## 2022-03-31 ENCOUNTER — Other Ambulatory Visit: Payer: Self-pay | Admitting: *Deleted

## 2022-03-31 ENCOUNTER — Ambulatory Visit: Payer: No Typology Code available for payment source | Admitting: Allergy & Immunology

## 2022-03-31 ENCOUNTER — Encounter: Payer: Self-pay | Admitting: Allergy & Immunology

## 2022-03-31 ENCOUNTER — Other Ambulatory Visit: Payer: Self-pay | Admitting: Emergency Medicine

## 2022-03-31 ENCOUNTER — Other Ambulatory Visit: Payer: Self-pay

## 2022-03-31 VITALS — BP 134/80 | HR 82 | Temp 98.1°F | Resp 16 | Wt 174.4 lb

## 2022-03-31 DIAGNOSIS — J4540 Moderate persistent asthma, uncomplicated: Secondary | ICD-10-CM | POA: Diagnosis not present

## 2022-03-31 DIAGNOSIS — J3089 Other allergic rhinitis: Secondary | ICD-10-CM | POA: Diagnosis not present

## 2022-03-31 DIAGNOSIS — J302 Other seasonal allergic rhinitis: Secondary | ICD-10-CM

## 2022-03-31 DIAGNOSIS — B999 Unspecified infectious disease: Secondary | ICD-10-CM

## 2022-03-31 DIAGNOSIS — I10 Essential (primary) hypertension: Secondary | ICD-10-CM

## 2022-03-31 MED ORDER — LISINOPRIL-HYDROCHLOROTHIAZIDE 20-12.5 MG PO TABS: 1.0000 | ORAL_TABLET | Freq: Every day | ORAL | 3 refills | Status: DC

## 2022-03-31 MED ORDER — RYALTRIS 665-25 MCG/ACT NA SUSP: NASAL | 5 refills | Status: AC

## 2022-03-31 MED ORDER — MONTELUKAST SODIUM 10 MG PO TABS: 10.0000 mg | ORAL_TABLET | Freq: Every day | ORAL | 1 refills | Status: DC

## 2022-03-31 MED ORDER — OMEPRAZOLE 40 MG PO CPDR: 40.0000 mg | DELAYED_RELEASE_CAPSULE | Freq: Every day | ORAL | 5 refills | Status: DC

## 2022-03-31 MED ORDER — PREDNISONE 10 MG PO TABS: ORAL_TABLET | ORAL | 0 refills | Status: DC

## 2022-03-31 MED ORDER — FLUTICASONE-SALMETEROL 250-50 MCG/ACT IN AEPB: 1.0000 | INHALATION_SPRAY | Freq: Two times a day (BID) | RESPIRATORY_TRACT | 5 refills | Status: DC

## 2022-03-31 MED ORDER — ALBUTEROL SULFATE HFA 108 (90 BASE) MCG/ACT IN AERS: 2.0000 | INHALATION_SPRAY | Freq: Four times a day (QID) | RESPIRATORY_TRACT | 2 refills | Status: DC | PRN

## 2022-03-31 NOTE — Progress Notes (Signed)
FOLLOW UP  Date of Service/Encounter:  03/31/22   Assessment:   Moderate persistent asthma, uncomplicated    Perennial and seasonal allergic rhinitis (grasses, trees, indoor molds, dust mite, cat) - interested in allergen immunotherapy   Recurrent infections    Eczema   GERD - better controlled   Hypertension - on amlodipine  Plan/Recommendations:   1. Moderate persistent asthma, uncomplicated - Lung testing looked stable today.  - Daily controller medication(s): Advair 250/66mg one puff twice daily - Prior to physical activity: albuterol 2 puffs 10-15 minutes before physical activity. - Rescue medications: albuterol 4 puffs every 4-6 hours as needed - Asthma control goals:  * Full participation in all desired activities (may need albuterol before activity) * Albuterol use two time or less a week on average (not counting use with activity) * Cough interfering with sleep two time or less a month * Oral steroids no more than once a year * No hospitalizations  2. Perennial and seasonal allergic rhinitis (grasses, trees, indoor molds, dust mite, cat) - I am sending in a prednisone burst to get this under control.  - STRONGLY consider allergy shots for long term control. - Continue with: Xyzal (levocetirizine) '5mg'$  tablet once daily and Singulair (montelukast) '10mg'$  daily - Continue with: Ryaltris (olopatadine/mometasone) two sprays per nostril 1-2 times daily as needed - The nose spray contains a nasal steroid AND a nasal antihistamine.  - You can use an extra dose of the antihistamine, if needed, for breakthrough symptoms.  - Consider nasal saline rinses 1-2 times daily to remove allergens from the nasal cavities as well as help with mucous clearance (this is especially helpful to do before the nasal sprays are given) - We could do allergy shots in the future if needed.  - This is a curative process, but it does take some time.   3. Recurrent infections - You had an  excellent response to the Pneumovax.    4. GERD - Continue with omeprazole '40mg'$  once daily.  5. Return in about 3 months (around 07/01/2022).    Subjective:   RTANYKA ALDERSis a 47y.o. female presenting today for follow up of  Chief Complaint  Patient presents with   Asthma   Follow-up   Nasal Polyps   Sinus Problem    Quaniyah A PGeierhas a history of the following: Patient Active Problem List   Diagnosis Date Noted   Moderate persistent asthma, uncomplicated [id number]  Perennial allergic rhinitis 12/02/2021   Recurrent infections 12/02/2021   Flexural atopic dermatitis 12/02/2021   Gastroesophageal reflux disease 12/02/2021   Essential (primary) hypertension 12/09/2016    History obtained from: chart review and patient.  RFierrais a 47y.o. female presenting for a follow up visit.  She was last seen in December 2023.  At that time, her lung testing looked much better.  We changed her to Advair 250/50 micrograms 1 puff twice daily since this seems to be covered better. For her rhinitis, we continued with Xyzal and Singulair as well as Ryaltris.  For her recurrent infections, she got a Pneumovax.  For her GERD, we continue with omeprazole 40 mg once daily.    Since last visit, she reports that she has done well. She has started to have problems recently with the increase in the pollen counts.   Asthma/Respiratory Symptom History: She remains on her Advair 1 puff twice daily.  She has not been using her rescue medication much at all.  This seems  to be controlling her asthma.  ACT is 25, indicating excellent asthma control.  She has not been on prednisone.  She has not been in the hospital for breathing.  Allergic Rhinitis Symptom History: She has been having more congestion for the past couple of days. She has been throat clearing and using cough drops to help with the cough and postnasal drip. She is using the Singulair and Xyzal and Singulair and this is not doing the trick.   She did do some grass cutting on Sunday with the weed eater. She did use an N95 mask and was fully clothed. But she could still smelling the weeds.   She does not remember if she had the problems one year ago. Her mother got sick and she cannot remember if she was suffering from her allergies. She does remember having bronchitis a couple of times last year.   She really is open to starting allergy shots.  This is something she is considered.  She would like the CPT code so that she can talk to her insurance company about it.  Evidently, her mother was on allergy around same age.  They helped her quite a bit.  She has not been sick since we saw her last time. She is having the issues now with her allergies, but she has not had a sinus infection or pneumonia or AOM.                                                                      Otherwise, there have been no changes to her past medical history, surgical history, family history, or social history.    Review of Systems  Constitutional: Negative.  Negative for chills, fever, malaise/fatigue and weight loss.  HENT:  Positive for congestion and sinus pain. Negative for ear discharge and ear pain.   Eyes:  Negative for pain, discharge and redness.  Respiratory:  Negative for cough, sputum production, shortness of breath and wheezing.   Cardiovascular: Negative.  Negative for chest pain and palpitations.  Gastrointestinal:  Negative for abdominal pain, constipation, diarrhea, heartburn, nausea and vomiting.  Skin: Negative.  Negative for itching and rash.  Neurological:  Negative for dizziness and headaches.  Endo/Heme/Allergies:  Positive for environmental allergies. Does not bruise/bleed easily.       Objective:   Blood pressure 134/80, pulse 82, temperature 98.1 F (36.7 C), temperature source Temporal, resp. rate 16, weight 174 lb 6.4 oz (79.1 kg), SpO2 97 %. Body mass index is 31.9 kg/m.    Physical Exam Vitals reviewed.   Constitutional:      Appearance: She is well-developed.  HENT:     Head: Normocephalic and atraumatic.     Right Ear: Tympanic membrane, ear canal and external ear normal. No drainage, swelling or tenderness. Tympanic membrane is not injected, scarred, erythematous, retracted or bulging.     Left Ear: Tympanic membrane, ear canal and external ear normal. No drainage, swelling or tenderness. Tympanic membrane is not injected, scarred, erythematous, retracted or bulging.     Nose: No nasal deformity, septal deviation, mucosal edema or rhinorrhea.     Right Turbinates: Enlarged, swollen and pale.     Left Turbinates: Enlarged, swollen and pale.     Right Sinus:  No maxillary sinus tenderness or frontal sinus tenderness.     Left Sinus: No maxillary sinus tenderness or frontal sinus tenderness.     Mouth/Throat:     Mouth: Mucous membranes are not pale and not dry.     Pharynx: Uvula midline.  Eyes:     General:        Right eye: No discharge.        Left eye: No discharge.     Conjunctiva/sclera: Conjunctivae normal.     Right eye: Right conjunctiva is not injected. No chemosis.    Left eye: Left conjunctiva is not injected. No chemosis.    Pupils: Pupils are equal, round, and reactive to light.  Cardiovascular:     Rate and Rhythm: Normal rate and regular rhythm.     Heart sounds: Normal heart sounds.  Pulmonary:     Effort: Pulmonary effort is normal. No tachypnea, accessory muscle usage or respiratory distress.     Breath sounds: Normal breath sounds. No wheezing, rhonchi or rales.     Comments: Moving air well in all lung fields.  No increased work of breathing. Chest:     Chest wall: No tenderness.  Lymphadenopathy:     Head:     Right side of head: No submandibular, tonsillar or occipital adenopathy.     Left side of head: No submandibular, tonsillar or occipital adenopathy.     Cervical: No cervical adenopathy.  Skin:    General: Skin is warm.     Capillary Refill:  Capillary refill takes less than 2 seconds.     Coloration: Skin is not pale.     Findings: No abrasion, erythema, petechiae or rash. Rash is not papular, urticarial or vesicular.     Comments: No eczematous or urticarial lesions noted.  She does have some excoriations present on the bilateral arms.  Neurological:     Mental Status: She is alert.  Psychiatric:        Behavior: Behavior is cooperative.      Diagnostic studies:  Spirometry: results normal (FEV1: 2.25/99%, FVC: 2.75/99%, FEV1/FVC: 82%).    Spirometry consistent with normal pattern.   Allergy Studies: none        Salvatore Marvel, MD  [facility]

## 2022-03-31 NOTE — Patient Instructions (Addendum)
1. Moderate persistent asthma, uncomplicated - Lung testing looked stable today.  - Daily controller medication(s): Advair 250/53mg one puff twice daily - Prior to physical activity: albuterol 2 puffs 10-15 minutes before physical activity. - Rescue medications: albuterol 4 puffs every 4-6 hours as needed - Asthma control goals:  * Full participation in all desired activities (may need albuterol before activity) * Albuterol use two time or less a week on average (not counting use with activity) * Cough interfering with sleep two time or less a month * Oral steroids no more than once a year * No hospitalizations  2. Perennial and seasonal allergic rhinitis (grasses, trees, indoor molds, dust mite, cat) - I am sending in a prednisone burst to get this under control.  - STRONGLY consider allergy shots for long term control. - Continue with: Xyzal (levocetirizine) '5mg'$  tablet once daily and Singulair (montelukast) '10mg'$  daily - Continue with: Ryaltris (olopatadine/mometasone) two sprays per nostril 1-2 times daily as needed - The nose spray contains a nasal steroid AND a nasal antihistamine.  - You can use an extra dose of the antihistamine, if needed, for breakthrough symptoms.  - Consider nasal saline rinses 1-2 times daily to remove allergens from the nasal cavities as well as help with mucous clearance (this is especially helpful to do before the nasal sprays are given) - We could do allergy shots in the future if needed.  - This is a curative process, but it does take some time.   3. Recurrent infections - You had an excellent response to the Pneumovax.    4. GERD - Continue with omeprazole '40mg'$  once daily.  5. Return in about 3 months (around 07/01/2022).    Please inform [place] any Emergency Department visits, hospitalizations, or changes in symptoms. Call [place] going to the ED for breathing or allergy symptoms since we might be able to fit you in for a sick visit. Feel free to  contact u[place]time with any questions, problems, or concerns.  It was a pleasure to see you and your family again today!  Websites that have reliable patient information: 1. American Academy of Asthma, Allergy, and Immunology: www.aaaai.org 2. Food Allergy Research and Education (FARE): foodallergy.org 3. Mothers of Asthmatics: http://www.asthmacommunitynetwork.org 4. American College of Allergy, Asthma, and Immunology: www.acaai.org   COVID-19 Vaccine Information can be found at: [email or web address] questions related to vaccine distribution or appointments, please email [email or web address] or call [phone]   We realize that you might be concerned about having an allergic reaction to the COVID19 vaccines. To help with that concern, WE ARE OFFERING THE COVID19 VACCINES IN OUR OFFICE! Ask the front desk for dates!     "Like" [facility] Facebook and Instagram for our latest updates!      A healthy democracy works best when ANew [employer] Life Insuranceparticipate! Make sure you are registered to vote! If you have moved or changed any of your contact information, you will need to get this updated before voting!  In some cases, you MAY be able to register to vote online: [email or web address]    Allergy Shots  Allergies are the result of a chain reaction that starts in the immune system. Your immune system controls how your body defends itself. For instance, if you have an allergy to pollen, your immune system identifies pollen as an invader or allergen. Your immune system overreacts by producing antibodies called Immunoglobulin E (IgE). These antibodies travel to cells that release chemicals, causing an allergic reaction.  The concept  behind allergy immunotherapy, whether it is received in the form of shots or tablets, is that the immune system can be desensitized to specific allergens that trigger allergy  symptoms. Although it requires time and patience, the payback can be long-term relief. Allergy injections contain a dilute solution of those substances that you are allergic to based upon your skin testing and allergy history.   How Do Allergy Shots Work?  Allergy shots work much like a vaccine. Your body responds to injected amounts of a particular allergen given in increasing doses, eventually developing a resistance and tolerance to it. Allergy shots can lead to decreased, minimal or no allergy symptoms.  There generally are two phases: build-up and maintenance. Build-up often ranges from three to six months and involves receiving injections with increasing amounts of the allergens. The shots are typically given once or twice a week, though more rapid build-up schedules are sometimes used.  The maintenance phase begins when the most effective dose is reached. This dose is different for each person, depending on how allergic you are and your response to the build-up injections. Once the maintenance dose is reached, there are longer periods between injections, typically two to four weeks.  Occasionally doctors give cortisone-type shots that can temporarily reduce allergy symptoms. These types of shots are different and should not be confused with allergy immunotherapy shots.  Who Can Be Treated with Allergy Shots?  Allergy shots may be a good treatment approach for people with allergic rhinitis (hay fever), allergic asthma, conjunctivitis (eye allergy) or stinging insect allergy.   Before deciding to begin allergy shots, you should consider:   The length of allergy season and the severity of your symptoms  Whether medications and/or changes to your environment can control your symptoms  Your desire to avoid long-term medication use  Time: allergy immunotherapy requires a major time commitment  Cost: may vary depending on your insurance coverage  Allergy shots for children age 78 and older  are effective and often well tolerated. They might prevent the onset of new allergen sensitivities or the progression to asthma.  Allergy shots are not started on patients who are pregnant but can be continued on patients who become pregnant while receiving them. In some patients with other medical conditions or who take certain common medications, allergy shots may be of risk. It is important to mention other medications you talk to your allergist.   What are the two types of build-ups offered:   [name] or Rapid Desensitization -- one day of injections lasting from 8:30-4:30pm, injections every 1 hour.  Approximately half of the build-up process is completed in that one day.  The following week, normal build-up is resumed, and this entails ~16 visits either weekly or twice weekly, until reaching your "maintenance dose" which is continued weekly until eventually getting spaced out to every month for a duration of 3 to 5 years. The regular build-up appointments are nurse visits where the injections are administered, followed by required monitoring for 30 minutes.    Traditional build-up -- weekly visits for 6 -12 months until reaching "maintenance dose", then continue weekly until eventually spacing out to every 4 weeks as above. At these appointments, the injections are administered, followed by required monitoring for 30 minutes.     Either way is acceptable, and both are equally effective. With the rush protocol, the advantage is that less time is spent here for injections overall AND you would also reach maintenance dosing faster (which is when the clinical  benefit starts to become more apparent). Not everyone is a candidate for rapid desensitization.   IF we proceed with the [name] protocol, there are premedications which must be taken the day before and the day after the rush only (this includes antihistamines, steroids, and Singulair).  After the rush day, no prednisone or Singulair is required, and  we just recommend antihistamines taken on your injection day.  What Is An Estimate of the Costs?  If you are interested in starting allergy injections, please check with your insurance company about your coverage for both allergy vial sets and allergy injections.  Please do so prior to making the appointment to start injections.  The following are CPT codes to give to your insurance company. These are the amounts we BILL to the insurance company, but the amount YOU WILL PAY and [place] and depends on the contracts we have with different insurance companies.   Amount Billed to Insurance One allergy vial set  CPT 95165   $ 1200     Two allergy vial set  CPT 95165   $ 2400     Three allergy vial set  CPT 95165   $ 3600     One injection   CPT 95115   $ 35  Two injections   CPT 95117   $ 40 [name] (Rapid Desensitization) CPT 95180 x 8 hours $500/hour  Regarding the allergy injections, your co-pay may or may not apply with each injection, so please confirm this with your insurance company. When you start allergy injections, 1 or 2 sets of vials are made based on your allergies.  Not all patients can be on one set of vials. A set of vials lasts 6 months to a year depending on how quickly you can proceed with your build-up of your allergy injections. Vials are personalized for each patient depending on their specific allergens.  How often are allergy injection given during the build-up period?   Injections are given at least weekly during the build-up period until your maintenance dose is achieved. Per the doctor's discretion, you may have the option of getting allergy injections two times per week during the build-up period. However, there must be at least 48 hours between injections. The build-up period is usually completed within 6-12 months depending on your ability to schedule injections and for adjustments for reactions. When maintenance dose is reached, your injection  schedule is gradually changed to every two weeks and later to every three weeks. Injections will then continue every 4 weeks. Usually, injections are continued for a total of 3-5 years.   When Will I Feel Better?  Some may experience decreased allergy symptoms during the build-up phase. For others, it may take as long as 12 months on the maintenance dose. If there is no improvement after a year of maintenance, your allergist will discuss other treatment options with you.  If you aren't responding to allergy shots, it may be because there is not enough dose of the allergen in your vaccine or there are missing allergens that were not identified during your allergy testing. Other reasons could be that there are high levels of the allergen in your environment or major exposure to non-allergic triggers like tobacco smoke.  What Is the Length of Treatment?  Once the maintenance dose is reached, allergy shots are generally continued for three to five years. The decision to stop should be discussed with your allergist at that time. Some people may experience a permanent  reduction of allergy symptoms. Others may relapse and a longer course of allergy shots can be considered.  What Are the Possible Reactions?  The two types of adverse reactions that can occur with allergy shots are local and systemic. Common local reactions include very mild redness and swelling at the injection site, which can happen immediately or several hours after. Report a delayed reaction from your last injection. These include arm swelling or runny nose, watery eyes or cough that occurs within 12-24 hours after injection. A systemic reaction, which is less common, affects the entire body or a particular body system. They are usually mild and typically respond quickly to medications. Signs include increased allergy symptoms such as sneezing, a stuffy nose or hives.   Rarely, a serious systemic reaction called anaphylaxis can develop.  Symptoms include swelling in the throat, wheezing, a feeling of tightness in the chest, nausea or dizziness. Most serious systemic reactions develop within 30 minutes of allergy shots. This is why it is strongly recommended you wait in your doctor's office for 30 minutes after your injections. Your allergist is trained to watch for reactions, and his or her staff is trained and equipped with the proper medications to identify and treat them.   Report to the nurse immediately if you experience any of the following symptoms: swelling, itching or redness of the skin, hives, watery eyes/nose, breathing difficulty, excessive sneezing, coughing, stomach pain, diarrhea, or light headedness. These symptoms may occur within 15-20 minutes after injection and may require medication.   Who Should Administer Allergy Shots?  The preferred location for receiving shots is your prescribing allergist's office. Injections can sometimes be given at another facility where the physician and staff are trained to recognize and treat reactions, and have received instructions by your prescribing allergist.  What if I am late for an injection?   Injection dose will be adjusted depending upon how many days or weeks you are late for your injection.   What if I am sick?   Please report any illness to the nurse before receiving injections. She may adjust your dose or postpone injections depending on your symptoms. If you have fever, flu, sinus infection or chest congestion it is best to postpone allergy injections until you are better. Never get an allergy injection if your asthma is causing you problems. If your symptoms persist, seek out medical care to get your health problem under control.  What If I am or Become Pregnant:  Women that become pregnant should schedule an appointment with [facility] [place] before receiving any further allergy injections.

## 2022-04-01 ENCOUNTER — Ambulatory Visit: Payer: No Typology Code available for payment source | Admitting: Physical Therapy

## 2022-04-01 ENCOUNTER — Other Ambulatory Visit: Payer: Self-pay

## 2022-04-01 ENCOUNTER — Encounter: Payer: Self-pay | Admitting: Physical Therapy

## 2022-04-01 DIAGNOSIS — G8929 Other chronic pain: Secondary | ICD-10-CM

## 2022-04-01 DIAGNOSIS — M6281 Muscle weakness (generalized): Secondary | ICD-10-CM

## 2022-04-01 DIAGNOSIS — M25511 Pain in right shoulder: Secondary | ICD-10-CM | POA: Diagnosis not present

## 2022-04-01 NOTE — Therapy (Signed)
OUTPATIENT PHYSICAL THERAPY TREATMENT NOTE   Patient Name: Tracy [name] MRN: [id number] DOB:1975/11/09, 47 y.o., female Today's Date: 04/01/2022  PCP: Horald Pollen, MD REFERRING PROVIDER: Glennon Mac, DO   END OF SESSION:   PT End of Session - 04/01/22 0930     Visit Number 12    Number of Visits 15    Date for PT Re-Evaluation 04/23/22    Authorization Type [employer]    PT Start Time 0930    PT Stop Time 1010    PT Time Calculation (min) 40 min    Activity Tolerance Patient tolerated treatment well    Behavior During Therapy WFL for tasks assessed/performed                       Past Medical History:  Diagnosis Date   Asthma    Breast lump    Eczema    Hypertension    Past Surgical History:  Procedure Laterality Date   BREAST LUMPECTOMY WITH RADIOACTIVE SEED LOCALIZATION Left 07/30/2020   Procedure: LEFT BREAST LUMPECTOMY WITH RADIOACTIVE SEED LOCALIZATION;  Surgeon: Erroll Luna, MD;  Location: [place];  Service: General;  Laterality: Left;   [place]  06/19/2008   [place] [facility]ERY  10/2010   Patient Active Problem List   Diagnosis Date Noted   Moderate persistent asthma, uncomplicated [id number]   Perennial allergic rhinitis 12/02/2021   Recurrent infections 12/02/2021   Flexural atopic dermatitis 12/02/2021   Gastroesophageal reflux disease 12/02/2021   Essential (primary) hypertension 12/09/2016    REFERRING DIAG: Chronic right shoulder pain; Impingement syndrome of right shoulder   THERAPY DIAG:  Chronic right shoulder pain  Muscle weakness (generalized)  Rationale for Evaluation and Treatment Rehabilitation  PERTINENT HISTORY: None   PRECAUTIONS: None    SUBJECTIVE:            SUBJECTIVE STATEMENT:  Patient reports she is still able to scratch her back, and used the weed eater and her shoulder did not bother  her.   PAIN:  Are you having pain? Yes:  NPRS scale: 0/10 (4/10 with shoulder movement / activity) Pain location: Right shoulder Pain description: Intermittent, sharp, lightning Aggravating factors: Shoulder movement, reaching behind, overhead Relieving factors: Rest   OBJECTIVE: (objective measures completed at initial evaluation unless otherwise dated) PATIENT SURVEYS:  FOTO 58% functional status  03/02/2022: 67%  04/01/2022: 65%  POSTURE: Rounded shoulders and forward head   CERVICAL ROM:    Active ROM A/PROM (deg) eval  Flexion WFL  Extension WFL  Right lateral flexion WFL  Left lateral flexion WFL  Right rotation [facility]  Left rotation [facility]    Patient does report right shoulder pain with cervical right side bend   UPPER EXTREMITY ROM:    Active ROM Right eval Left eval Right 02/11/22 Right 02/23/22 Right 03/12/22 Right 03/24/2022  Shoulder flexion 135 150 145 145 145   Shoulder extension          Shoulder abduction          Shoulder internal rotation Reach to L1 Reach to T7 T10 T9  T8  Shoulder external rotation 70 Reach to C7 70 Reach to T4      (Blank rows = not tested)   UPPER EXTREMITY MMT:   MMT Right eval Left eval Rt / Lt 02/18/22 Rt 03/12/2022  Shoulder flexion 4 5  Shoulder extension        Shoulder abduction 4 5    Shoulder internal rotation 5 5    Shoulder external rotation 4 5 4  / 5 4+  Middle trapezius     4- / 4 4-  Lower trapezius     4- / 4 4-  Elbow flexion 5 5    Elbow extension 5 5    Grip strength (lbs)        (Blank rows = not tested)   SHOULDER SPECIAL TESTS: Impingement tests: Hawkins/Kennedy impingement test: positive   PALPATION:  Tender right upper trap, mid trap and rhomboid region, infraspinatus; referral to right anterior shoulder with mid trap palpation               TODAY'S TREATMENT:      [facility] Adult PT Treatment:                                                DATE: 04/01/2022 Therapeutic Exercise: UBE L3 x 4 min  (fwd/bwd) while taking subjective Supine serratus punch with 8# 2 x 15 Supine shoulder flexion with red loop around wrist 2 x 10 Single arm row with FM 17# from elevated position (5) 2 x 10 Standing W with red anchored in front 2 x 10 Serratus wall slide with pillow case around wrists 2 x 10 Standing barbell landmine shoulder press with serratus punch 2 x 10 each Manual: Right GHJ mobs at various ranges of elevation Right shoulder PROM   [facility] Adult PT Treatment:                                                DATE: 03/23/2022 Therapeutic Exercise: UBE L3 x 4 min (fwd/bwd) while taking subjective Supine serratus punch with 8# 2 x 15 Supine horizontal abduction with blue 2 x 10 Standing barbell landmine shoulder press with serratus punch 2 x 10 each Shoulder ER with green 2 x 15 on right Single arm row with FM 13# 2 x 10 Manual: Skilled palpation and monitoring of muscle tension while performing TPDN STM right upper trap Right GHJ mobs at various ranges of elevation Trigger Point Dry Needling Treatment: Pre-treatment instruction: Patient instructed on dry needling rationale, procedures, and possible side effects including pain during treatment (achy,cramping feeling), bruising, drop of blood, lightheadedness, nausea, sweating. Patient Consent Given: Yes Education handout provided: No Muscles treated: Right upper trap Needle size and number: .30x55mm x 3 Electrical stimulation performed: No Parameters: N/A Treatment response/outcome: Twitch response elicited and Palpable decrease in muscle tension Post-treatment instructions: Patient instructed to expect possible mild to moderate muscle soreness later today and/or tomorrow. Patient instructed in methods to reduce muscle soreness and to continue prescribed HEP. If patient was dry needled over the lung field, patient was instructed on signs and symptoms of pneumothorax and, however unlikely, to see immediate medical attention should they  occur. Patient was also educated on signs and symptoms of infection and to seek medical attention should they occur. Patient verbalized understanding of these instructions and education.  [facility] Adult PT Treatment:  DATE: 03/15/2022 Therapeutic Exercise: UBE L3 x 4 min (fwd/bwd) while taking subjective Quadruped shoulder flexion and thoracic extension stretch x 5 Sidelying shoulder ER with 5# 3 x 10 on right Sidelying horizontal abduction with 3# 3 x 10 on right Low row from high anchor point using FM 13# 2 x 10 on right Supine serratus punch with 8# 2 x 10 on right Manual: Right GHJ mobs at various ranges of elevation Scap pinned shoulder elevation and cross body stretching   PATIENT EDUCATION: Education details: HEP Person educated: Patient Education method: Consulting civil engineer, Demonstration, Corporate treasurer cues, Verbal cues Education comprehension: verbalized understanding, returned demonstration, verbal cues required, tactile cues required, and needs further education   HOME EXERCISE PROGRAM: Access Code: [email or web address]      ASSESSMENT: CLINICAL IMPRESSION: Patient tolerated therapy well with no adverse effects. Therapy continues to focus on progressing shoulder mobility and strengthening with good tolerance. She did report some right anterolateral shoulder discomfort with rotator cuff strengthening especially when fatigued. More focus placed on serratus strengthening this visit. Cueing provided for scapular mechanics to avoid shrug. She does report a reduction in functional status on FOTO this visit. No changes to HEP this visit. Patient would benefit from continued skilled PT to progress her mobility and strength in order to reduce pain and maximize functional ability.    OBJECTIVE IMPAIRMENTS: decreased activity tolerance, decreased ROM, decreased strength, postural dysfunction, and pain.    ACTIVITY LIMITATIONS: carrying, lifting, sleeping, dressing,  reach over head, and hygiene/grooming   PARTICIPATION LIMITATIONS: meal prep, cleaning, laundry, shopping, and occupation   PERSONAL FACTORS: Fitness, Past/current experiences, and Time since onset of injury/illness/exacerbation are also affecting patient's functional outcome.      GOALS: Goals reviewed with patient? Yes   SHORT TERM GOALS: Target date: 02/23/2022   Patient will be I with initial HEP in order to progress with therapy. Baseline: HEP provided at eval 02/23/2022: independent Goal status: MET   2.  PT will review FOTO with patient by 3rd visit in order to understand expected progress and outcome with therapy. Baseline: FOTO assessed at eval 02/23/2022: reviewed Goal status: MET   3.  Patient will report right shoulder pain </= 4/10 with shoulder movement and activity to reduce functional limitations Baseline:  02/23/2022: 4/10 pain Goal status: MET   LONG TERM GOALS: Target date: 04/23/2022   Patient will be I with final HEP to maintain progress from PT. Baseline: HEP provided at eval 03/12/2022: progressing Goal status: ONGOING   2.  Patient will report >/= 68% status on FOTO to indicate improved functional ability. Baseline: 58% functional status 03/02/2022: 67% 04/01/2022: 65% Goal status: PARTIALLY MET   3.  Patient will demonstrate right shoulder elevation >/= 150 deg to improve overhead reach and completing grooming tasks Baseline: 135 deg 03/12/2022: 145 Goal status: PARTIALLY MET   4.  Patient will demonstrate right shoulder strength >/= 5/5 MMT in order to improve lifting and performing household tasks with right arm Baseline: strength deficits noted above 03/12/2022: 4+/5 MMT Goal status: PARTIALLY MET     PLAN: PT FREQUENCY: 1x/week   PT DURATION: 6 weeks   PLANNED INTERVENTIONS: Therapeutic exercises, Therapeutic activity, Neuromuscular re-education, Balance training, Gait training, Patient/Family education, Self Care, Joint mobilization, Joint  manipulation, Aquatic Therapy, Dry Needling, Spinal manipulation, Spinal mobilization, Cryotherapy, Moist heat, Taping, Ionotophoresis 4mg /ml Dexamethasone, Manual therapy, and Re-evaluation   PLAN FOR NEXT SESSION: Review HEP and progress PRN, manual / dry needling for upper trap/infra/mid trap/rhomboid, right shoulder stretching and mobilizations,  progress rotator cuff and postural strengthening   Hilda Blades, PT, DPT, LAT, ATC 04/01/22  10:16 AM Phone: [phone] Fax: [phone]

## 2022-04-04 ENCOUNTER — Other Ambulatory Visit: Payer: Self-pay | Admitting: Sports Medicine

## 2022-04-04 ENCOUNTER — Other Ambulatory Visit: Payer: Self-pay | Admitting: Allergy & Immunology

## 2022-04-05 ENCOUNTER — Ambulatory Visit (INDEPENDENT_AMBULATORY_CARE_PROVIDER_SITE_OTHER): Payer: No Typology Code available for payment source | Admitting: Sports Medicine

## 2022-04-05 VITALS — BP 118/84 | Ht 62.0 in | Wt 174.0 lb

## 2022-04-05 DIAGNOSIS — M25511 Pain in right shoulder: Secondary | ICD-10-CM | POA: Diagnosis not present

## 2022-04-05 DIAGNOSIS — M7541 Impingement syndrome of right shoulder: Secondary | ICD-10-CM

## 2022-04-05 DIAGNOSIS — G8929 Other chronic pain: Secondary | ICD-10-CM

## 2022-04-05 NOTE — Patient Instructions (Addendum)
Good to see you  Knee HEP  4 week follow up   

## 2022-04-06 NOTE — Therapy (Signed)
OUTPATIENT PHYSICAL THERAPY TREATMENT NOTE  DISCHARGE   Patient Name: Tracy [name] MRN: [id number] DOB:1975/08/21, 47 y.o., female Today's Date: 04/07/2022  PCP: Horald Pollen, MD REFERRING PROVIDER: Glennon Mac, DO   END OF SESSION:   PT End of Session - 04/07/22 0935     Visit Number 13    Number of Visits 15    Date for PT Re-Evaluation 04/23/22    Authorization Type [employer]    PT Start Time 0930    PT Stop Time 1010    PT Time Calculation (min) 40 min    Activity Tolerance Patient tolerated treatment well    Behavior During Therapy WFL for tasks assessed/performed                        Past Medical History:  Diagnosis Date   Asthma    Breast lump    Eczema    Hypertension    Past Surgical History:  Procedure Laterality Date   BREAST LUMPECTOMY WITH RADIOACTIVE SEED LOCALIZATION Left 07/30/2020   Procedure: LEFT BREAST LUMPECTOMY WITH RADIOACTIVE SEED LOCALIZATION;  Surgeon: Erroll Luna, MD;  Location: [facility];  Service: General;  Laterality: Left;   [place]  06/19/2008   [place] SURGERY  10/2010   Patient Active Problem List   Diagnosis Date Noted   Moderate persistent asthma, uncomplicated [id number]   Perennial allergic rhinitis 12/02/2021   Recurrent infections 12/02/2021   Flexural atopic dermatitis 12/02/2021   Gastroesophageal reflux disease 12/02/2021   Essential (primary) hypertension 12/09/2016    REFERRING DIAG: Chronic right shoulder pain; Impingement syndrome of right shoulder   THERAPY DIAG:  Chronic right shoulder pain  Muscle weakness (generalized)  Rationale for Evaluation and Treatment Rehabilitation  PERTINENT HISTORY: None   PRECAUTIONS: None    SUBJECTIVE:            SUBJECTIVE STATEMENT:  Patient reports the shoulder feels good today.  PAIN:  Are you having pain? Yes:  NPRS scale:  0/10 Pain location: Right shoulder Pain description: Intermittent, sharp, lightning Aggravating factors: Shoulder movement, reaching behind, overhead Relieving factors: Rest   OBJECTIVE: (objective measures completed at initial evaluation unless otherwise dated) PATIENT SURVEYS:  FOTO 58% functional status  03/02/2022: 67%  04/01/2022: 65% 04/07/2022: 83%  POSTURE: Rounded shoulders and forward head   CERVICAL ROM:    Active ROM A/PROM (deg) eval  Flexion WFL  Extension WFL  Right lateral flexion WFL  Left lateral flexion WFL  Right rotation [facility]  Left rotation [facility]    Patient does report right shoulder pain with cervical right side bend   UPPER EXTREMITY ROM:    Active ROM Right eval Left eval Right 02/11/22 Right 02/23/22 Right 03/12/22 Right 03/24/2022 Rt 04/07/22  Shoulder flexion 135 150 145 145 145  150  Shoulder extension           Shoulder abduction           Shoulder internal rotation Reach to L1 Reach to T7 T10 T9  T8   Shoulder external rotation 70 Reach to C7 70 Reach to T4       (Blank rows = not tested)   UPPER EXTREMITY MMT:   MMT Right eval Left eval Rt / Lt 02/18/22 Rt 03/12/2022 Rt 04/07/22  Shoulder flexion 4 5     Shoulder extension  Shoulder abduction 4 5     Shoulder internal rotation 5 5     Shoulder external rotation 4 5 4  / 5 4+ 5  Middle trapezius     4- / 4 4-   Lower trapezius     4- / 4 4-   Elbow flexion 5 5     Elbow extension 5 5     Grip strength (lbs)         (Blank rows = not tested)   SHOULDER SPECIAL TESTS: Impingement tests: Hawkins/Kennedy impingement test: positive   PALPATION:  Tender right upper trap, mid trap and rhomboid region, infraspinatus; referral to right anterior shoulder with mid trap palpation               TODAY'S TREATMENT:      [facility] Adult PT Treatment:                                                DATE: 04/07/2022 Therapeutic Exercise: UBE L3 x 4 min (fwd/bwd) while taking  subjective Sidelying thoracic rotation x 10 each Sleeper stretch on right 2 x 20 sec Cross body stretch 2 x 20 sec Supine serratus punch with 7# 2 x 15 Quadruped thoracic extension x 5 Seated double ER and scap retraction with blue 2 x 15 Row with blue 2 x 15 Serratus wall slide 2 x 10 Doorway pec stretch 3 x 20 sec   [facility] Adult PT Treatment:                                                DATE: 04/01/2022 Therapeutic Exercise: UBE L3 x 4 min (fwd/bwd) while taking subjective Supine serratus punch with 8# 2 x 15 Supine shoulder flexion with red loop around wrist 2 x 10 Single arm row with FM 17# from elevated position (5) 2 x 10 Standing W with red anchored in front 2 x 10 Serratus wall slide with pillow case around wrists 2 x 10 Standing barbell landmine shoulder press with serratus punch 2 x 10 each Manual: Right GHJ mobs at various ranges of elevation Right shoulder PROM  [facility] Adult PT Treatment:                                                DATE: 03/23/2022 Therapeutic Exercise: UBE L3 x 4 min (fwd/bwd) while taking subjective Supine serratus punch with 8# 2 x 15 Supine horizontal abduction with blue 2 x 10 Standing barbell landmine shoulder press with serratus punch 2 x 10 each Shoulder ER with green 2 x 15 on right Single arm row with FM 13# 2 x 10 Manual: Skilled palpation and monitoring of muscle tension while performing TPDN STM right upper trap Right GHJ mobs at various ranges of elevation Trigger Point Dry Needling Treatment: Pre-treatment instruction: Patient instructed on dry needling rationale, procedures, and possible side effects including pain during treatment (achy,cramping feeling), bruising, drop of blood, lightheadedness, nausea, sweating. Patient Consent Given: Yes Education handout provided: No Muscles treated: Right upper trap Needle size and number: .30x51mm x 3 Electrical stimulation performed: No  Parameters: N/A Treatment response/outcome: Twitch  response elicited and Palpable decrease in muscle tension Post-treatment instructions: Patient instructed to expect possible mild to moderate muscle soreness later today and/or tomorrow. Patient instructed in methods to reduce muscle soreness and to continue prescribed HEP. If patient was dry needled over the lung field, patient was instructed on signs and symptoms of pneumothorax and, however unlikely, to see immediate medical attention should they occur. Patient was also educated on signs and symptoms of infection and to seek medical attention should they occur. Patient verbalized understanding of these instructions and education.   PATIENT EDUCATION: Education details: POC discharge, FOTO, HEP Person educated: Patient Education method: Explanation Education comprehension: Verbalized understanding, returned demonstration   HOME EXERCISE PROGRAM: Access Code: [email or web address]      ASSESSMENT: CLINICAL IMPRESSION: Patient tolerated therapy well with no adverse effects. Patient has made great progress in therapy and has achieved all her established goals. She is independent in her HEP so will be formally discharged from PT.    OBJECTIVE IMPAIRMENTS: decreased activity tolerance, decreased ROM, decreased strength, postural dysfunction, and pain.    ACTIVITY LIMITATIONS: carrying, lifting, sleeping, dressing, reach over head, and hygiene/grooming   PARTICIPATION LIMITATIONS: meal prep, cleaning, laundry, shopping, and occupation   PERSONAL FACTORS: Fitness, Past/current experiences, and Time since onset of injury/illness/exacerbation are also affecting patient's functional outcome.      GOALS: Goals reviewed with patient? Yes   SHORT TERM GOALS: Target date: 02/23/2022   Patient will be I with initial HEP in order to progress with therapy. Baseline: HEP provided at eval 02/23/2022: independent Goal status: MET   2.  PT will review FOTO with patient by 3rd visit in order to understand expected  progress and outcome with therapy. Baseline: FOTO assessed at eval 02/23/2022: reviewed Goal status: MET   3.  Patient will report right shoulder pain </= 4/10 with shoulder movement and activity to reduce functional limitations Baseline:  02/23/2022: 4/10 pain Goal status: MET   LONG TERM GOALS: Target date: 04/23/2022   Patient will be I with final HEP to maintain progress from PT. Baseline: HEP provided at eval 03/12/2022: progressing 04/07/2022: independent Goal status: MET   2.  Patient will report >/= 68% status on FOTO to indicate improved functional ability. Baseline: 58% functional status 03/02/2022: 67% 04/01/2022: 65% 04/07/2022: 83% Goal status: MET   3.  Patient will demonstrate right shoulder elevation >/= 150 deg to improve overhead reach and completing grooming tasks Baseline: 135 deg 03/12/2022: 145 04/07/2022: 150 deg Goal status: MET   4.  Patient will demonstrate right shoulder strength >/= 5/5 MMT in order to improve lifting and performing household tasks with right arm Baseline: strength deficits noted above 03/12/2022: 4+/5 MMT 04/07/2022: 5/5 MMT Goal status: MET     PLAN: PT FREQUENCY: 1x/week   PT DURATION: 6 weeks   PLANNED INTERVENTIONS: Therapeutic exercises, Therapeutic activity, Neuromuscular re-education, Balance training, Gait training, Patient/Family education, Self Care, Joint mobilization, Joint manipulation, Aquatic Therapy, Dry Needling, Spinal manipulation, Spinal mobilization, Cryotherapy, Moist heat, Taping, Ionotophoresis 4mg /ml Dexamethasone, Manual therapy, and Re-evaluation   PLAN FOR NEXT SESSION: NA - discharge   Hilda Blades, PT, DPT, LAT, ATC 04/07/22  10:16 AM Phone: [phone] Fax: [phone]   PHYSICAL THERAPY DISCHARGE SUMMARY  Visits from Start of Care: 13  Current functional level related to goals / functional outcomes: See above   Remaining deficits: See above   Education / Equipment: HEP   Patient  agrees to discharge. Patient goals were  met. Patient is being discharged due to meeting the stated rehab goals.

## 2022-04-07 ENCOUNTER — Encounter: Payer: Self-pay | Admitting: Physical Therapy

## 2022-04-07 ENCOUNTER — Ambulatory Visit: Payer: No Typology Code available for payment source | Admitting: Physical Therapy

## 2022-04-07 ENCOUNTER — Other Ambulatory Visit: Payer: Self-pay

## 2022-04-07 DIAGNOSIS — M25511 Pain in right shoulder: Secondary | ICD-10-CM | POA: Diagnosis not present

## 2022-04-07 DIAGNOSIS — M6281 Muscle weakness (generalized): Secondary | ICD-10-CM

## 2022-04-07 DIAGNOSIS — G8929 Other chronic pain: Secondary | ICD-10-CM

## 2022-04-07 NOTE — Patient Instructions (Signed)
Access Code: [facility] URL: [email or web address] Date: 04/07/2022 Prepared by: Hilda Blades  Exercises - Sidelying Thoracic Lumbar Rotation  - 1-2 x daily - 10 reps - 5 seconds hold - Sleeper Stretch  - 1-2 x daily - 3 reps - 20 seconds hold - Sidelying Posterior Cuff Cross Body Stretch  - 1-2 x daily - 3 reps - 20 seconds hold - Supine Scapular Protraction in Flexion with Dumbbells  - 1 x daily - 2-3 sets - 15 reps - Quadruped Thoracic Spine Extension  - 1-2 x daily - 10 reps - 5 seconds hold - Standing Upper Trapezius Mobilization with Small Ball  - Single Arm Doorway Pec Stretch at 90 Degrees Abduction  - 1-2 x daily - 3 reps - 30 hold - Shoulder External Rotation and Scapular Retraction with Resistance  - 1 x daily - 2-3 sets - 10 reps - Standing Row with Anchored Resistance  - 1 x daily - 2-3 sets - 15 reps - Serratus Activation at Wall  - 1 x daily - 2 sets - 10 reps
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
## 2022-05-03 ENCOUNTER — Ambulatory Visit: Payer: No Typology Code available for payment source | Admitting: Sports Medicine

## 2022-05-03 VITALS — BP 126/82 | Ht 62.0 in | Wt 174.0 lb

## 2022-05-03 DIAGNOSIS — G8929 Other chronic pain: Secondary | ICD-10-CM

## 2022-05-03 DIAGNOSIS — M25511 Pain in right shoulder: Secondary | ICD-10-CM

## 2022-05-03 DIAGNOSIS — M7541 Impingement syndrome of right shoulder: Secondary | ICD-10-CM | POA: Diagnosis not present

## 2022-05-03 NOTE — Patient Instructions (Signed)
Good to see you   

## 2022-06-08 ENCOUNTER — Telehealth: Payer: Self-pay | Admitting: Allergy & Immunology

## 2022-06-08 ENCOUNTER — Other Ambulatory Visit: Payer: Self-pay | Admitting: Allergy & Immunology

## 2022-06-08 NOTE — Telephone Encounter (Signed)
Patient called and needs to have the Tessalon Perles called into [facility] on [address]. [phone]

## 2022-06-10 MED ORDER — BENZONATATE 100 MG PO CAPS: 100.0000 mg | ORAL_CAPSULE | Freq: Three times a day (TID) | ORAL | 0 refills | Status: DC | PRN

## 2022-06-10 NOTE — Telephone Encounter (Signed)
Patient called back and has been informed. Patient verbalized understanding.

## 2022-06-10 NOTE — Telephone Encounter (Signed)
Called and left a voicemail asking for a return call to inform.  

## 2022-06-10 NOTE — Telephone Encounter (Signed)
I sent in one script without a refill. If this cough continues to be a problem, we need to see her sooner.   Malachi Bonds, MD [facility]

## 2022-07-05 ENCOUNTER — Ambulatory Visit (INDEPENDENT_AMBULATORY_CARE_PROVIDER_SITE_OTHER): Payer: No Typology Code available for payment source | Admitting: Allergy & Immunology

## 2022-07-05 ENCOUNTER — Other Ambulatory Visit: Payer: Self-pay

## 2022-07-05 ENCOUNTER — Encounter: Payer: Self-pay | Admitting: Allergy & Immunology

## 2022-07-05 VITALS — BP 140/100 | HR 72 | Temp 98.4°F | Ht 62.0 in | Wt 177.4 lb

## 2022-07-05 DIAGNOSIS — K219 Gastro-esophageal reflux disease without esophagitis: Secondary | ICD-10-CM | POA: Diagnosis not present

## 2022-07-05 DIAGNOSIS — J4540 Moderate persistent asthma, uncomplicated: Secondary | ICD-10-CM

## 2022-07-05 DIAGNOSIS — L2089 Other atopic dermatitis: Secondary | ICD-10-CM | POA: Diagnosis not present

## 2022-07-05 DIAGNOSIS — J3089 Other allergic rhinitis: Secondary | ICD-10-CM | POA: Diagnosis not present

## 2022-07-05 DIAGNOSIS — J302 Other seasonal allergic rhinitis: Secondary | ICD-10-CM

## 2022-07-05 MED ORDER — FLUTICASONE-SALMETEROL 250-50 MCG/ACT IN AEPB: 1.0000 | INHALATION_SPRAY | Freq: Two times a day (BID) | RESPIRATORY_TRACT | 5 refills | Status: DC

## 2022-07-05 MED ORDER — BETAMETHASONE DIPROPIONATE 0.05 % EX CREA: TOPICAL_CREAM | Freq: Two times a day (BID) | CUTANEOUS | 5 refills | Status: DC

## 2022-07-05 MED ORDER — EPINEPHRINE 0.3 MG/0.3ML IJ SOAJ: 0.3000 mg | INTRAMUSCULAR | 2 refills | Status: DC | PRN

## 2022-07-05 MED ORDER — MONTELUKAST SODIUM 10 MG PO TABS: 10.0000 mg | ORAL_TABLET | Freq: Every day | ORAL | 1 refills | Status: DC

## 2022-07-05 MED ORDER — OMEPRAZOLE 40 MG PO CPDR: 40.0000 mg | DELAYED_RELEASE_CAPSULE | Freq: Every day | ORAL | 5 refills | Status: DC

## 2022-07-05 MED ORDER — TACROLIMUS 0.1 % EX OINT: TOPICAL_OINTMENT | Freq: Two times a day (BID) | CUTANEOUS | 5 refills | Status: DC

## 2022-07-05 MED ORDER — AIRSUPRA 90-80 MCG/ACT IN AERO: 2.0000 | INHALATION_SPRAY | RESPIRATORY_TRACT | 3 refills | Status: DC | PRN

## 2022-07-05 NOTE — Progress Notes (Signed)
FOLLOW UP  Date of Service/Encounter:  07/05/22   Assessment:   Moderate persistent asthma, uncomplicated    Perennial and seasonal allergic rhinitis (grasses, trees, indoor molds, dust mite, cat) - interested in allergen immunotherapy   Recurrent infections    Eczema   GERD - better controlled   Hypertension - on amlodipine  Plan/Recommendations:   1. Moderate persistent asthma, uncomplicated - Lung testing looked stable today.  - I am giving you a sample of AirSupra to use instead of albuterol. - This contains albuterol combined with an inhaled steroid. - It can cut down on exacerbations and the steroid helps the albuterol to work more effectively. - Sample and copay card provided.  - This will NOT replace the Advair.  - Daily controller medication(s): Advair 250/101mcg one puff twice daily - Prior to physical activity: AirSupra 2 puffs 10-15 minutes before physical activity. - Rescue medications: Airsupra 2 puff every 4-6 hours as needed - Asthma control goals:  * Full participation in all desired activities (may need albuterol before activity) * Albuterol use two time or less a week on average (not counting use with activity) * Cough interfering with sleep two time or less a month * Oral steroids no more than once a year * No hospitalizations  2. Perennial and seasonal allergic rhinitis (grasses, trees, indoor molds, dust mite, cat) - We are going to start rush immunotherapy in  on Monday, July 8th.  - Our office is at [facility] in [place].  - Then we can transfer the vials to the [place] office after that time.  - There is a premedication regimen to start on Sunday before your appointment:   DAY BEFORE IMMUNOTHERAPY: Take one tablet of Zyrtec or Claritin 10 mg or Allegra 180 mg (over the counter) Pepcid 20 mg in the morning and evening (over the counter)  Singulair 10 mg in the morning (5mg  if 6-14, 4mg  if <6) Prednisone 40 mg (2  pills) in the morning    DAY OF IMMUNOTHERAPY: Take one tablet of Zyrtec or Claritin 10 mg or Allegra 180 mg (over the counter) Pepcid 20 mg in the morning and evening (over the counter)  Singulair 10 mg in the morning  (5mg  if 6-14, 4mg  if <6yo) Prednisone 40 mg (2 pills) in the morning    Bring your Epipen with you to the clinic! Make sure to bring food, and activities to keep you occupied for the day  - Continue with: Xyzal (levocetirizine) 5mg  tablet once daily and Singulair (montelukast) 10mg  daily - Continue with: Ryaltris (olopatadine/mometasone) two sprays per nostril 1-2 times daily as needed - The nose spray contains a nasal steroid AND a nasal antihistamine.  - You can use an extra dose of the antihistamine, if needed, for breakthrough symptoms.  - Consider nasal saline rinses 1-2 times daily to remove allergens from the nasal cavities as well as help with mucous clearance (this is especially helpful to do before the nasal sprays are given) - Allergy shot consent signed today.   3. Recurrent infections - You had an excellent response to the Pneumovax.    4. GERD - Continue with omeprazole 40mg  once daily.  5. Eczema - Continue with Protopic twice daily as needed (safe to use on the face). - Continue with betamethasone twice daily as needed (DO NOT use on the face)   6. Return in about 6 months (around 01/04/2023).   Subjective:   Tracy TAROLLI is a 47 y.o. female presenting  today for follow up of  Chief Complaint  Patient presents with   Follow-up    No concerns    Tracy Tracy [name] has a history of the following: Patient Active Problem List   Diagnosis Date Noted   Moderate persistent asthma, uncomplicated 12/02/2021   Perennial allergic rhinitis 12/02/2021   Recurrent infections 12/02/2021   Flexural atopic dermatitis 12/02/2021   Gastroesophageal reflux disease 12/02/2021   Essential (primary) hypertension 12/09/2016    History obtained from: chart review  and patient.  Tracy [name] is a 47 y.o. female presenting for a follow up visit.  She was last seen in March 2024.  At that time, lung testing looks stable.  We continue with Advair 250/50 micrograms 1 puff twice daily as well as albuterol as needed.  For her rhinitis, we sent in a prednisone burst because she was not well-controlled with medications.  We talked about doing allergy shots for long-term control.  Will continue with Xyzal as well as Singulair and Ryaltris.  For her history of recurrent infections, she had a great response to Pneumovax.  Her GERD was controlled with omeprazole 40 mg once daily.  Since last visit, she has done really well.   Asthma/Respiratory Symptom History: She remains on the Advair 250/50 one puff twice daily. She is using her albuterol around 2-3 times per week. She has not been on prednisone (since we gave her some last time for her allergies) and she has not been to the ED For her symptoms.  She feels that the Advair is working well to control her symptoms.   Allergic Rhinitis Symptom History: She does have issues when she cuts the grass. She does wear a mask and covers her arms and legs. She does take the Tessalon pearls when she has coughing after being inside. She is on the Allegra in the morning and the Xyzal at night, but she still has problems. She clears her throat a lot and she has a lot of congestion.  She is using her nose spray as well. She did look into the allergy shots - insurance covers both rush and traditional. She is interested in starting this process and is open to rush immunotherapy.   Skin Symptom History: She does have a history of  eczema.  We have never given her anything for her, but she has been using her mother's prescriptions of betamethasone and Protopic.  This seems to work very well for her and she would like prescriptions for her sent into the pharmacy.  She mostly gets her eczema on her arms and occasionally on her eyelids.   GERD Symptom  History: She remains on her PPI for GERD.  This is working well to control her symptoms.  Otherwise, there have been no changes to her past medical history, surgical history, family history, or social history.    Review of Systems  Constitutional: Negative.  Negative for chills, fever, malaise/fatigue and weight loss.  HENT:  Positive for congestion. Negative for ear discharge, ear pain and sinus pain.   Eyes:  Negative for pain, discharge and redness.  Respiratory:  Negative for cough, sputum production, shortness of breath and wheezing.   Cardiovascular: Negative.  Negative for chest pain and palpitations.  Gastrointestinal:  Negative for abdominal pain, constipation, diarrhea, heartburn, nausea and vomiting.  Skin: Negative.  Negative for itching and rash.  Neurological:  Negative for dizziness and headaches.  Endo/Heme/Allergies:  Positive for environmental allergies. Does not bruise/bleed easily.  Objective:   Blood pressure (!) 140/100, pulse 72, temperature 98.4 F (36.9 C), height 5\' 2"  (1.575 m), weight 177 lb 6.4 oz (80.5 kg), SpO2 100 %. Body mass index is 32.45 kg/m.    Physical Exam Vitals reviewed.  Constitutional:      Appearance: She is well-developed.  HENT:     Head: Normocephalic and atraumatic.     Right Ear: Tympanic membrane, ear canal and external ear normal. No drainage, swelling or tenderness. Tympanic membrane is not injected, scarred, erythematous, retracted or bulging.     Left Ear: Tympanic membrane, ear canal and external ear normal. No drainage, swelling or tenderness. Tympanic membrane is not injected, scarred, erythematous, retracted or bulging.     Nose: No nasal deformity, septal deviation, mucosal edema or rhinorrhea.     Right Turbinates: Enlarged, swollen and pale.     Left Turbinates: Enlarged, swollen and pale.     Right Sinus: No maxillary sinus tenderness or frontal sinus tenderness.     Left Sinus: No maxillary sinus tenderness  or frontal sinus tenderness.     Comments: No polyps. Some clear rhinorrhea.     Mouth/Throat:     Mouth: Mucous membranes are not pale and not dry.     Pharynx: Uvula midline.  Eyes:     General:        Right eye: No discharge.        Left eye: No discharge.     Conjunctiva/sclera: Conjunctivae normal.     Right eye: Right conjunctiva is not injected. No chemosis.    Left eye: Left conjunctiva is not injected. No chemosis.    Pupils: Pupils are equal, round, and reactive to light.  Cardiovascular:     Rate and Rhythm: Normal rate and regular rhythm.     Heart sounds: Normal heart sounds.  Pulmonary:     Effort: Pulmonary effort is normal. No tachypnea, accessory muscle usage or respiratory distress.     Breath sounds: Normal breath sounds. No wheezing, rhonchi or rales.     Comments: Moving air well in all lung fields.  No increased work of breathing. Chest:     Chest wall: No tenderness.  Lymphadenopathy:     Head:     Right side of head: No submandibular, tonsillar or occipital adenopathy.     Left side of head: No submandibular, tonsillar or occipital adenopathy.     Cervical: No cervical adenopathy.  Skin:    General: Skin is warm.     Capillary Refill: Capillary refill takes less than 2 seconds.     Coloration: Skin is not pale.     Findings: No abrasion, erythema, petechiae or rash. Rash is not papular, urticarial or vesicular.     Comments: No eczematous or urticarial lesions noted.  She does have some excoriations present on the bilateral arms.  Neurological:     Mental Status: She is alert.  Psychiatric:        Behavior: Behavior is cooperative.      Diagnostic studies:    Spirometry: results normal (FEV1: 2.21/98%, FVC: 2.62/94%, FEV1/FVC: 84%).    Spirometry consistent with normal pattern.   Allergy Studies: none        Malachi Bonds, MD  [facility]

## 2022-07-05 NOTE — Patient Instructions (Addendum)
1. Moderate persistent asthma, uncomplicated - Lung testing looked stable today.  - I am giving you a sample of AirSupra to use instead of albuterol. - This contains albuterol combined with an inhaled steroid. - It can cut down on exacerbations and the steroid helps the albuterol to work more effectively. - Sample and copay card provided.  - This will NOT replace the Advair.  - Daily controller medication(s): Advair 250/25mcg one puff twice daily - Prior to physical activity: AirSupra 2 puffs 10-15 minutes before physical activity. - Rescue medications: Airsupra 2 puff every 4-6 hours as needed - Asthma control goals:  * Full participation in all desired activities (may need albuterol before activity) * Albuterol use two time or less a week on average (not counting use with activity) * Cough interfering with sleep two time or less a month * Oral steroids no more than once a year * No hospitalizations  2. Perennial and seasonal allergic rhinitis (grasses, trees, indoor molds, dust mite, cat) - We are going to start rush immunotherapy in [place] on Monday, July 8th.  - Our office is at [facility] in [place].  - Then we can transfer the vials to the [place] office after that time.  - There is a premedication regimen to start on Sunday before your appointment:   DAY BEFORE IMMUNOTHERAPY: Take one tablet of Zyrtec or Claritin 10 mg or Allegra 180 mg (over the counter) Pepcid 20 mg in the morning and evening (over the counter)  Singulair 10 mg in the morning (5mg  if 6-14, 4mg  if <6) Prednisone 40 mg (2 pills) in the morning    DAY OF IMMUNOTHERAPY: Take one tablet of Zyrtec or Claritin 10 mg or Allegra 180 mg (over the counter) Pepcid 20 mg in the morning and evening (over the counter)  Singulair 10 mg in the morning  (5mg  if 6-14, 4mg  if <6yo) Prednisone 40 mg (2 pills) in the morning    Bring your Epipen with you to the clinic! Make sure to bring food, and  activities to keep you occupied for the day  - Continue with: Xyzal (levocetirizine) 5mg  tablet once daily and Singulair (montelukast) 10mg  daily - Continue with: Ryaltris (olopatadine/mometasone) two sprays per nostril 1-2 times daily as needed - The nose spray contains a nasal steroid AND a nasal antihistamine.  - You can use an extra dose of the antihistamine, if needed, for breakthrough symptoms.  - Consider nasal saline rinses 1-2 times daily to remove allergens from the nasal cavities as well as help with mucous clearance (this is especially helpful to do before the nasal sprays are given) - Allergy shot consent signed today.   3. Recurrent infections - You had an excellent response to the Pneumovax.    4. GERD - Continue with omeprazole 40mg  once daily.  5. Eczema - Continue with Protopic twice daily as needed (safe to use on the face). - Continue with betamethasone twice daily as needed (DO NOT use on the face)   6. Return in about 6 months (around 01/04/2023).    Please inform us of any Emergency Department visits, hospitalizations, or changes in symptoms. Call us before going to the ED for breathing or allergy symptoms since we might be able to fit you in for a sick visit. Feel free to contact us anytime with any questions, problems, or concerns.  It was a pleasure to see you guys again today!  Websites that have reliable patient information: 1. American Academy of Asthma,  Allergy, and Immunology: www.aaaai.org 2. Food Allergy Research and Education (FARE): foodallergy.org 3. Mothers of Asthmatics: http://www.asthmacommunitynetwork.org 4. American College of Allergy, Asthma, and Immunology: www.acaai.org   COVID-19 Vaccine Information can be found at: [email or web address] For questions related to vaccine distribution or appointments, please email [email or web address] or call [phone].   We realize that you  might be concerned about having an allergic reaction to the COVID19 vaccines. To help with that concern, WE ARE OFFERING THE COVID19 VACCINES IN OUR OFFICE! Ask the front desk for dates!     "Like" [place] on Facebook and Instagram for our latest updates!      A healthy democracy works best when [employer] participate! Make sure you are registered to vote! If you have moved or changed any of your contact information, you will need to get this updated before voting!  In some cases, you MAY be able to register to vote online: [email or web address]

## 2022-07-07 NOTE — Progress Notes (Signed)
eroallergen Immunotherapy  Ordering Provider: Dr. Malachi Bonds  Patient Details Name: Tracy [name] MRN: [id number] Date of Birth: 11/16/1975  Order 1 of 1  Vial Label: G/T/DM/C  0.5 ml (Volume)  BAU Concentration -- 7 Grass Mix* 100,000 ([address] [place], [place], [place], [place], Red[place], Sweet Vernal, Timothy) 0.5 ml (Volume)  1:20 Concentration -- Eastern 10 Tree Mix (also Sweet Gum) 0.2 ml (Volume)  1:10 Concentration -- Pecan Pollen 0.2 ml (Volume)  1:10 Concentration -- Sycamore Eastern* 0.2 ml (Volume)  1:20 Concentration -- Walnut, Black Pollen 0.8 ml (Volume)  1:10 Concentration -- Cat Hair   2.4  ml Extract Subtotal 2.6  ml Diluent 5.0  ml Maintenance Total  Schedule:  B Silver Vial (1:1,000,000): [name] Blue Vial (1:100,000): [name] Yellow Vial (1:10,000): [name] Green Vial (1:1,000): Schedule B (6 doses) Red Vial (1:100): Schedule A (14 doses)  Special Instructions: After completion of the first Red Vial, please space to every two weeks. After completion of the second Red Vial, please space to every 4 weeks. Ok to up dose new vials at 0.47mL --> 0.3 mL --> 0.5 mL. Ok to come twice weekly, if desired, as long as there is 48 hours between injections. Patient will be doing rush immunotherapy in [place] and then transferring her vials to the [place] office.

## 2022-07-11 ENCOUNTER — Telehealth: Payer: Self-pay | Admitting: Allergy & Immunology

## 2022-07-11 MED ORDER — BETAMETHASONE VALERATE 0.1 % EX OINT: 1.0000 | TOPICAL_OINTMENT | Freq: Two times a day (BID) | CUTANEOUS | 5 refills | Status: AC

## 2022-07-11 NOTE — Telephone Encounter (Signed)
Patient states she is scheduled for [name] Immunotherapy in [place] on 07-25-22.   I do not see patient scheduled then, do we need to add her to the schedule?   Patient also states her epipen is too expensive & she needs that for her [name] appointment. Patient would like a cheaper alternative sent in if possible.  Patient also states the betamethasone should be sent in as an ointment instead of the cream. Patient would like prescription adjusted to the ointment.   Best contact number: [phone]

## 2022-07-11 NOTE — Telephone Encounter (Signed)
Left a message for patient to call the office in regards to the [facility] immunotherapy. Want to be sure she has contacted her insurance prior to starting [name]. I have also sent in the betamethasone in ointment form to the [facility] on [address]. Per Dr. Dellis Anes she prefers MyChart messages over calls. I have also sent a MyChart message regarding this matter.

## 2022-07-15 NOTE — Telephone Encounter (Signed)
Left a message for patient to call the office in regards to [facility] Immunotherapy.

## 2022-07-18 DIAGNOSIS — J3089 Other allergic rhinitis: Secondary | ICD-10-CM | POA: Diagnosis not present

## 2022-07-18 NOTE — Progress Notes (Signed)
VIALS EXP 07-18-23

## 2022-07-19 MED ORDER — EPINEPHRINE 0.3 MG/0.3ML IJ SOAJ: 0.3000 mg | INTRAMUSCULAR | 2 refills | Status: DC | PRN

## 2022-07-19 MED ORDER — PREDNISONE 20 MG PO TABS: ORAL_TABLET | ORAL | 0 refills | Status: DC

## 2022-07-19 NOTE — Telephone Encounter (Signed)
I have sent in the Epipen and Prednisone for [name]. I have sent a Mychart message regarding premeds. I left a message for patient to call the office.

## 2022-07-19 NOTE — Telephone Encounter (Signed)
Please send in Adrenclick for her (0.3 mg).   Thanks!   Malachi Bonds, MD [facility]

## 2022-07-20 NOTE — Telephone Encounter (Signed)
I called the patient to confirm that she was coming next week. LVM asking her to call us back.

## 2022-07-24 NOTE — Telephone Encounter (Signed)
I called the patient and did talk to her to confirm that she was coming tomorrow. She is taking her premedications.   Malachi Bonds, MD [facility]

## 2022-07-25 ENCOUNTER — Other Ambulatory Visit: Payer: Self-pay

## 2022-07-25 ENCOUNTER — Encounter: Payer: Self-pay | Admitting: Allergy & Immunology

## 2022-07-25 ENCOUNTER — Ambulatory Visit (INDEPENDENT_AMBULATORY_CARE_PROVIDER_SITE_OTHER): Payer: No Typology Code available for payment source | Admitting: Allergy & Immunology

## 2022-07-25 VITALS — BP 118/78 | HR 60 | Temp 98.6°F | Resp 18 | Ht 61.81 in | Wt 174.5 lb

## 2022-07-25 DIAGNOSIS — L2089 Other atopic dermatitis: Secondary | ICD-10-CM

## 2022-07-25 DIAGNOSIS — J302 Other seasonal allergic rhinitis: Secondary | ICD-10-CM

## 2022-07-25 DIAGNOSIS — J3089 Other allergic rhinitis: Secondary | ICD-10-CM | POA: Diagnosis not present

## 2022-07-25 DIAGNOSIS — K219 Gastro-esophageal reflux disease without esophagitis: Secondary | ICD-10-CM

## 2022-07-25 DIAGNOSIS — B999 Unspecified infectious disease: Secondary | ICD-10-CM

## 2022-07-25 DIAGNOSIS — J4540 Moderate persistent asthma, uncomplicated: Secondary | ICD-10-CM

## 2022-07-25 NOTE — Progress Notes (Signed)
RAPID DESENSITIZATION Note  RE: Tracy [name] MRN: [id number] DOB: 1975-02-01 Date of Office Visit: 07/25/2022  Subjective:  Patient presents today for rapid desensitization. She was last seen in June 2024. At that time, lung testing looked stable. We did provide her with a sample of AirSupra. We continued with the Advair 250/50 one puff twice daily. For her rhinitis, she was interested in starting allergen immnuotherapy and wanted to do rush immunotherapy. We provided her with the premedication regimen. We continued with the levocetirizine in the morning and montelukast at night. We also continued with the Rylatris. She had previously responded very well to the Pneumovax. We continued with omeprazole 40mg  once daily. Atopic dermatitis was controlled with tacrolimus as well as the PRN use of betamethasone.   Interval History: Patient has not been ill, she has taken all premedications as per protocol.  Recent/Current History: Pulmonary disease: no Cardiac disease: no Respiratory infection: no Rash: no Itch: no Swelling: no Cough: no Shortness of breath: no Runny/stuffy nose: no Itchy eyes: no Beta-blocker use: no  Patient/guardian was informed of the procedure with verbalized understanding of the risk of anaphylaxis. Consent has been signed.   Medication List:  Current Outpatient Medications  Medication Sig Dispense Refill   Albuterol-Budesonide (AIRSUPRA) 90-80 MCG/ACT AERO Inhale 2 puffs into the lungs every 4 (four) hours as needed. 10.7 g 3   amLODipine (NORVASC) 10 MG tablet TAKE 1 TABLET BY MOUTH EVERY DAY 90 tablet 2   benzonatate (TESSALON PERLES) 100 MG capsule Take 1 capsule (100 mg total) by mouth 3 (three) times daily as needed for cough. 20 capsule 0   betamethasone valerate ointment (VALISONE) 0.1 % Apply 1 Application topically 2 (two) times daily. 30 g 5   DULoxetine (CYMBALTA) 30 MG capsule TAKE 1 CAPSULE EVERY DAY 90 capsule 0   EPINEPHrine 0.3 mg/0.3 mL IJ SOAJ  injection Inject 0.3 mg into the muscle as needed for anaphylaxis. 1 each 2   fluticasone (FLONASE) 50 MCG/ACT nasal spray Place 1 spray into both nostrils daily. 47.4 mL 1   fluticasone-salmeterol (ADVAIR DISKUS) 250-50 MCG/ACT AEPB Inhale 1 puff into the lungs in the morning and at bedtime. 60 each 5   levocetirizine (XYZAL) 5 MG tablet TAKE 1 TABLET BY MOUTH EVERY DAY IN THE EVENING 90 tablet 1   lisinopril-hydrochlorothiazide (ZESTORETIC) 20-12.5 MG tablet Take 1 tablet by mouth daily. 90 tablet 3   montelukast (SINGULAIR) 10 MG tablet Take 1 tablet (10 mg total) by mouth at bedtime. 90 tablet 1   Multiple Vitamin (MULTIVITAMIN WITH MINERALS) TABS tablet Take 1 tablet by mouth daily.     omeprazole (PRILOSEC) 40 MG capsule Take 1 capsule (40 mg total) by mouth daily. 30 capsule 5   predniSONE (DELTASONE) 20 MG tablet Take 40 mg (2 tablets) the morning before [name] Immunotherapy and take 40 mg (2 tablets) the morning of [name] Immunotherapy. 4 tablet 0   RYALTRIS 665-25 MCG/ACT SUSP 2 sprays per nostril 1-2 times daily as needed. 29 g 5   tacrolimus (PROTOPIC) 0.1 % ointment Apply topically 2 (two) times daily. 100 g 5   No current facility-administered medications for this visit.   Allergies: Allergies  Allergen Reactions   Penicillins Rash, Hives and Swelling    Has patient had a PCN reaction causing immediate rash, facial/tongue/throat swelling, SOB or lightheadedness with hypotension: No Has patient had a PCN reaction causing severe rash involving mucus membranes or skin necrosis: No Has patient had a PCN reaction that  required hospitalization NO Has patient had a PCN reaction occurring within the last 10 years: NO If all of the above answers are "NO", then may proceed with Cephalosporin use. Has patient had a PCN reaction causing immediate rash, facial/tongue/throat swelling, SOB or lightheadedness with hypotension: No Has patient had a PCN reaction causing severe rash involving mucus  membranes or skin necrosis: No Has patient had a PCN reaction that required hospitalization NO Has patient had a PCN reaction occurring within the last 10 years: NO If all of the above answers are "NO", then may proceed with Cephalosporin use.    Cefdinir Itching   Latex Itching, Swelling and Rash   I reviewed her past medical history, social history, family history, and environmental history and no significant changes have been reported from her previous visit.  ROS: Negative except as per HPI.  Objective: BP 118/78   Pulse 60   Temp 98.6 F (37 C)   Resp 18   Ht 5' 1.81" (1.57 m)   Wt 174 lb 8 oz (79.2 kg)   SpO2 98%   BMI 32.11 kg/m  Body mass index is 32.11 kg/m.   General Appearance:  Alert, cooperative, no distress, appears stated age  Head:  Normocephalic, without obvious abnormality, atraumatic  Eyes:  Conjunctiva clear, EOM's intact  Nose: Nares normal  Throat: Lips, tongue normal; teeth and gums normal, normal appearing posterior oropharnyx  Neck: Supple, symmetrical  Lungs:   Moving air well in all lung fields, Respirations unlabored, no coughing  Heart:  Appears well perfused  Extremities: No edema  Skin: Skin color, texture, turgor normal, no rashes or lesions on visualized portions of skin  Neurologic: No gross deficits     Diagnostics: Spirometry:  Tracings reviewed. Her effort: Good reproducible efforts. FVC: 2.53 L (91% predicted) FEV1: 2.21 L, 98% predicted FEV1/FVC ratio: 87% Interpretation: No overt abnormalities noted given today's efforts.  Please see scanned spirometry results for details.  PROCEDURES:  Patient received the following doses every hour: Step 1:  0.25ml - 1:1,000,000 dilution (silver vial) Step 2:  0.81ml - 1:1,000,000 dilution (silver vial) Step 3: 0.56ml - 1:100,000 dilution (blue vial)  Step 4: 0.91ml - 1:100,000 dilution (blue vial)  Step 5: 0.27ml - 1:10,000 dilution (gold vial) Step 6: 0.53ml - 1:10,000 dilution (gold  vial) Step 7: 0.70ml - 1:10,000 dilution (gold vial) Step 8: 0.81ml - 1:10,000 dilution (gold vial)  Patient was observed for 1 hour after the last dose.   Procedure started at 9:00 am Procedure ended at 3:20 pm   ASSESSMENT/PLAN:   Moderate persistent asthma, uncomplicated    Perennial and seasonal allergic rhinitis (grasses, trees, indoor molds, dust mite, cat) - interested in allergen immunotherapy   Recurrent infections    Eczema   GERD - better controlled   Hypertension - on amlodipine  Patient has tolerated the rapid desensitization protocol.  Next appointment: Start at 0.23ml of 1:1000 dilution (green vial) and build up per protocol.

## 2022-08-05 ENCOUNTER — Ambulatory Visit (INDEPENDENT_AMBULATORY_CARE_PROVIDER_SITE_OTHER): Payer: No Typology Code available for payment source

## 2022-08-05 DIAGNOSIS — J309 Allergic rhinitis, unspecified: Secondary | ICD-10-CM

## 2022-08-16 ENCOUNTER — Ambulatory Visit (INDEPENDENT_AMBULATORY_CARE_PROVIDER_SITE_OTHER): Payer: No Typology Code available for payment source

## 2022-08-16 DIAGNOSIS — J309 Allergic rhinitis, unspecified: Secondary | ICD-10-CM | POA: Diagnosis not present

## 2022-08-26 ENCOUNTER — Ambulatory Visit (INDEPENDENT_AMBULATORY_CARE_PROVIDER_SITE_OTHER): Payer: No Typology Code available for payment source

## 2022-08-26 DIAGNOSIS — J309 Allergic rhinitis, unspecified: Secondary | ICD-10-CM | POA: Diagnosis not present

## 2022-09-09 ENCOUNTER — Ambulatory Visit (INDEPENDENT_AMBULATORY_CARE_PROVIDER_SITE_OTHER): Payer: No Typology Code available for payment source | Admitting: *Deleted

## 2022-09-09 DIAGNOSIS — J309 Allergic rhinitis, unspecified: Secondary | ICD-10-CM

## 2022-09-16 ENCOUNTER — Ambulatory Visit (INDEPENDENT_AMBULATORY_CARE_PROVIDER_SITE_OTHER): Payer: No Typology Code available for payment source

## 2022-09-16 ENCOUNTER — Other Ambulatory Visit: Payer: Self-pay | Admitting: Allergy & Immunology

## 2022-09-16 DIAGNOSIS — J309 Allergic rhinitis, unspecified: Secondary | ICD-10-CM | POA: Diagnosis not present

## 2022-09-21 ENCOUNTER — Other Ambulatory Visit: Payer: Self-pay | Admitting: Emergency Medicine

## 2022-09-30 ENCOUNTER — Ambulatory Visit (INDEPENDENT_AMBULATORY_CARE_PROVIDER_SITE_OTHER): Payer: No Typology Code available for payment source

## 2022-09-30 DIAGNOSIS — J309 Allergic rhinitis, unspecified: Secondary | ICD-10-CM

## 2022-10-07 ENCOUNTER — Ambulatory Visit (INDEPENDENT_AMBULATORY_CARE_PROVIDER_SITE_OTHER): Payer: Self-pay | Admitting: *Deleted

## 2022-10-07 DIAGNOSIS — J309 Allergic rhinitis, unspecified: Secondary | ICD-10-CM | POA: Diagnosis not present

## 2022-10-14 ENCOUNTER — Ambulatory Visit (INDEPENDENT_AMBULATORY_CARE_PROVIDER_SITE_OTHER): Payer: No Typology Code available for payment source

## 2022-10-14 DIAGNOSIS — J309 Allergic rhinitis, unspecified: Secondary | ICD-10-CM

## 2022-10-21 ENCOUNTER — Ambulatory Visit (INDEPENDENT_AMBULATORY_CARE_PROVIDER_SITE_OTHER): Payer: No Typology Code available for payment source | Admitting: *Deleted

## 2022-10-21 DIAGNOSIS — J309 Allergic rhinitis, unspecified: Secondary | ICD-10-CM

## 2022-10-28 ENCOUNTER — Ambulatory Visit (INDEPENDENT_AMBULATORY_CARE_PROVIDER_SITE_OTHER): Payer: No Typology Code available for payment source

## 2022-10-28 DIAGNOSIS — J309 Allergic rhinitis, unspecified: Secondary | ICD-10-CM | POA: Diagnosis not present

## 2022-11-04 ENCOUNTER — Ambulatory Visit (INDEPENDENT_AMBULATORY_CARE_PROVIDER_SITE_OTHER): Payer: Self-pay

## 2022-11-04 DIAGNOSIS — J309 Allergic rhinitis, unspecified: Secondary | ICD-10-CM | POA: Diagnosis not present

## 2022-11-18 ENCOUNTER — Ambulatory Visit (INDEPENDENT_AMBULATORY_CARE_PROVIDER_SITE_OTHER): Payer: Self-pay

## 2022-11-18 DIAGNOSIS — J309 Allergic rhinitis, unspecified: Secondary | ICD-10-CM

## 2022-12-02 ENCOUNTER — Ambulatory Visit (INDEPENDENT_AMBULATORY_CARE_PROVIDER_SITE_OTHER): Payer: No Typology Code available for payment source | Admitting: *Deleted

## 2022-12-02 DIAGNOSIS — J309 Allergic rhinitis, unspecified: Secondary | ICD-10-CM

## 2022-12-09 ENCOUNTER — Ambulatory Visit (INDEPENDENT_AMBULATORY_CARE_PROVIDER_SITE_OTHER): Payer: No Typology Code available for payment source

## 2022-12-09 DIAGNOSIS — J309 Allergic rhinitis, unspecified: Secondary | ICD-10-CM | POA: Diagnosis not present

## 2022-12-14 ENCOUNTER — Ambulatory Visit (INDEPENDENT_AMBULATORY_CARE_PROVIDER_SITE_OTHER): Payer: No Typology Code available for payment source

## 2022-12-14 DIAGNOSIS — J309 Allergic rhinitis, unspecified: Secondary | ICD-10-CM

## 2022-12-20 ENCOUNTER — Other Ambulatory Visit: Payer: Self-pay | Admitting: Allergy & Immunology

## 2022-12-20 ENCOUNTER — Ambulatory Visit (INDEPENDENT_AMBULATORY_CARE_PROVIDER_SITE_OTHER): Payer: No Typology Code available for payment source

## 2022-12-20 ENCOUNTER — Ambulatory Visit: Payer: No Typology Code available for payment source | Admitting: Sports Medicine

## 2022-12-20 VITALS — BP 132/78 | Ht 61.0 in | Wt 174.0 lb

## 2022-12-20 DIAGNOSIS — M25561 Pain in right knee: Secondary | ICD-10-CM

## 2022-12-20 DIAGNOSIS — G8929 Other chronic pain: Secondary | ICD-10-CM | POA: Diagnosis not present

## 2022-12-20 MED ORDER — MELOXICAM 15 MG PO TABS: 15.0000 mg | ORAL_TABLET | Freq: Every day | ORAL | 2 refills | Status: DC

## 2022-12-20 NOTE — Progress Notes (Signed)
Tracy [name] D.[facility] [address] [place] [address] Phone: [phone]   Assessment and Plan:     1. Chronic pain of right knee -Chronic with exacerbation, subsequent visit - Most consistent with patellofemoral syndrome based on intermittent anterior knee pain over the past several years, though worsening over the past several months. - X-ray obtained in clinic.  My interpretation: No acute fracture or dislocation.  Small posterior patellar spurs.  Mildly decreased medial compartment space compared to lateral - Start meloxicam 15 mg daily x2 weeks.  If still having pain after 2 weeks, complete 3rd-week of NSAID. May use remaining NSAID as needed once daily for pain control.  Do not to use additional over-the-counter NSAIDs (ibuprofen, naproxen, Advil, Aleve) while taking prescription NSAIDs.  May use Tylenol [phone] mg 2 to 3 times a day for breakthrough pain. - Start HEP and physical therapy for knee - May use patellar knee brace as needed  15 additional minutes spent for educating Therapeutic Home Exercise Program.  This included exercises focusing on stretching, strengthening, with focus on eccentric aspects.   Long term goals include an improvement in range of motion, strength, endurance as well as avoiding reinjury. Patient's frequency would include in 1-2 times a day, 3-5 times a week for a duration of 6-12 weeks. Proper technique shown and discussed handout in great detail with ATC.  All questions were discussed and answered.     Pertinent previous records reviewed include none  Follow Up: 4 to 6 weeks for reevaluation.  Could consider CSI versus advanced imaging   Subjective:   I, Tracy [name], am serving as a Neurosurgeon for Doctor Richardean Sale  Chief Complaint: right knee pain   HPI:   12/20/22 Patient is a 47 year old female with right knee pain. Patient states pain for years, but over the last couple of months pain has  been increasing. Notes her knee sounds like popcorn. She just moved and has a two story house and thinks that could have aggravated her knee. No MOI. Notes her knee is swollen. Patellar tendon pain. When she kneels down she isnt able to put pressure on it. Tylenol and advil for the pain but only a short temporary relief. Has been using heating pad   Relevant Historical Information: Hypertension, GERD  Additional pertinent review of systems negative.   Current Outpatient Medications:    meloxicam (MOBIC) 15 MG tablet, Take 1 tablet (15 mg total) by mouth daily., Disp: 30 tablet, Rfl: 2   Albuterol-Budesonide (AIRSUPRA) 90-80 MCG/ACT AERO, Inhale 2 puffs into the lungs every 4 (four) hours as needed., Disp: 10.7 g, Rfl: 3   amLODipine (NORVASC) 10 MG tablet, TAKE 1 TABLET BY MOUTH EVERY DAY, Disp: 90 tablet, Rfl: 2   benzonatate (TESSALON PERLES) 100 MG capsule, Take 1 capsule (100 mg total) by mouth 3 (three) times daily as needed for cough., Disp: 20 capsule, Rfl: 0   betamethasone valerate ointment (VALISONE) 0.1 %, Apply 1 Application topically 2 (two) times daily., Disp: 30 g, Rfl: 5   DULoxetine (CYMBALTA) 30 MG capsule, TAKE 1 CAPSULE EVERY DAY, Disp: 90 capsule, Rfl: 0   EPINEPHrine 0.3 mg/0.3 mL IJ SOAJ injection, Inject 0.3 mg into the muscle as needed for anaphylaxis., Disp: 1 each, Rfl: 2   fluticasone (FLONASE) 50 MCG/ACT nasal spray, Place 1 spray into both nostrils daily., Disp: 47.4 mL, Rfl: 1   fluticasone-salmeterol (ADVAIR DISKUS) 250-50 MCG/ACT AEPB, Inhale 1 puff into the  lungs in the morning and at bedtime., Disp: 60 each, Rfl: 5   levocetirizine (XYZAL) 5 MG tablet, TAKE 1 TABLET BY MOUTH EVERY DAY IN THE EVENING, Disp: 28 tablet, Rfl: 1   lisinopril-hydrochlorothiazide (ZESTORETIC) 20-12.5 MG tablet, Take 1 tablet by mouth daily., Disp: 90 tablet, Rfl: 3   montelukast (SINGULAIR) 10 MG tablet, Take 1 tablet (10 mg total) by mouth at bedtime., Disp: 90 tablet, Rfl: 1    Multiple Vitamin (MULTIVITAMIN WITH MINERALS) TABS tablet, Take 1 tablet by mouth daily., Disp: , Rfl:    omeprazole (PRILOSEC) 40 MG capsule, TAKE 1 CAPSULE (40 MG TOTAL) BY MOUTH DAILY., Disp: 90 capsule, Rfl: 1   predniSONE (DELTASONE) 20 MG tablet, Take 40 mg (2 tablets) the morning before [name] and take 40 mg (2 tablets) the morning of [name]., Disp: 4 tablet, Rfl: 0   RYALTRIS 665-25 MCG/ACT SUSP, 2 sprays per nostril 1-2 times daily as needed., Disp: 29 g, Rfl: 5   tacrolimus (PROTOPIC) 0.1 % ointment, Apply topically 2 (two) times daily., Disp: 100 g, Rfl: 5   Objective:     Vitals:   12/20/22 0949  BP: 132/78  Weight: 174 lb (78.9 kg)  Height: 5\' 1"  (1.549 m)      Body mass index is 32.88 kg/m.    Physical Exam:    General:  awake, alert oriented, no acute distress nontoxic Skin: no suspicious lesions or rashes Neuro:sensation intact and strength 5/5 with no deficits, no atrophy, normal muscle tone Psych: No signs of anxiety, depression or other mood disorder  Right knee: Crepitus present No swelling No deformity Neg fluid wave, joint milking ROM Flex 110, Ext 0 TTP medial and lateral patella, plica, medial joint line, posterior fossa NTTP over the quad tendon, medial fem condyle, lat fem condyle, patella tendon, tibial tuberostiy, fibular head,  pes anserine bursa, gerdy's tubercle,  lateral jt line Neg anterior and posterior drawer Neg lachman Neg sag sign Negative varus stress Negative valgus stress Negative McMurray Positive Thessaly for anterior knee pain  Gait normal    Electronically signed by:  Tracy [name] [facility] 11:05 AM 12/20/22

## 2022-12-20 NOTE — Patient Instructions (Signed)
Knee HEP  - Start meloxicam 15 mg daily x2 weeks.  If still having pain after 2 weeks, complete 3rd-week of NSAID. May use remaining NSAID as needed once daily for pain control.  Do not to use additional over-the-counter NSAIDs (ibuprofen, naproxen, Advil, Aleve) while taking prescription NSAIDs.  May use Tylenol [phone] mg 2 to 3 times a day for breakthrough pain. PT referral  4-6 week follow up

## 2022-12-21 ENCOUNTER — Other Ambulatory Visit: Payer: Self-pay | Admitting: Allergy & Immunology

## 2022-12-22 ENCOUNTER — Ambulatory Visit (INDEPENDENT_AMBULATORY_CARE_PROVIDER_SITE_OTHER): Payer: No Typology Code available for payment source | Admitting: *Deleted

## 2022-12-22 DIAGNOSIS — J309 Allergic rhinitis, unspecified: Secondary | ICD-10-CM | POA: Diagnosis not present

## 2022-12-30 ENCOUNTER — Ambulatory Visit (INDEPENDENT_AMBULATORY_CARE_PROVIDER_SITE_OTHER): Payer: Self-pay

## 2022-12-30 DIAGNOSIS — J309 Allergic rhinitis, unspecified: Secondary | ICD-10-CM

## 2023-01-05 ENCOUNTER — Encounter: Payer: Self-pay | Admitting: Allergy & Immunology

## 2023-01-05 ENCOUNTER — Ambulatory Visit: Payer: No Typology Code available for payment source | Admitting: Allergy & Immunology

## 2023-01-05 VITALS — BP 130/86 | HR 86 | Temp 98.1°F | Ht 61.8 in | Wt 169.5 lb

## 2023-01-05 DIAGNOSIS — L2089 Other atopic dermatitis: Secondary | ICD-10-CM | POA: Diagnosis not present

## 2023-01-05 DIAGNOSIS — J4540 Moderate persistent asthma, uncomplicated: Secondary | ICD-10-CM | POA: Diagnosis not present

## 2023-01-05 DIAGNOSIS — J3089 Other allergic rhinitis: Secondary | ICD-10-CM

## 2023-01-05 DIAGNOSIS — J302 Other seasonal allergic rhinitis: Secondary | ICD-10-CM | POA: Diagnosis not present

## 2023-01-05 DIAGNOSIS — K219 Gastro-esophageal reflux disease without esophagitis: Secondary | ICD-10-CM

## 2023-01-05 MED ORDER — OPZELURA 1.5 % EX CREA: 1.0000 | TOPICAL_CREAM | Freq: Two times a day (BID) | CUTANEOUS | 1 refills | Status: AC | PRN

## 2023-01-05 NOTE — Patient Instructions (Addendum)
1. Moderate persistent asthma, uncomplicated - Lung testing looked excellent today.  - Daily controller medication(s): Advair 250/6mcg one puff twice daily - Prior to physical activity: albuterol 2 puffs 10-15 minutes before physical activity. - Rescue medications: albuterol 4 puffs every 4-6 hours as needed - Asthma control goals:  * Full participation in all desired activities (may need albuterol before activity) * Albuterol use two time or less a week on average (not counting use with activity) * Cough interfering with sleep two time or less a month * Oral steroids no more than once a year * No hospitalizations  2. Perennial and seasonal allergic rhinitis (grasses, trees, indoor molds, dust mite, cat) - Hopefully the use of Allegra before, on, and the day after. - We are adding on Opzelura to help with the itching.  - Continue with the allergy shots at the same schedule. - Spring will be totally different.  - Call us if the symptoms worsen.  - Be super aggressive with the allergy medications to see if this will help.  - Continue with: Xyzal (levocetirizine) 5mg  tablet once daily and Singulair (montelukast) 10mg  daily - Continue with: Ryaltris (olopatadine/mometasone) two sprays per nostril 1-2 times daily as needed - The nose spray contains a nasal steroid AND a nasal antihistamine.  - You can use an extra dose of the antihistamine, if needed, for breakthrough symptoms.  - Consider nasal saline rinses 1-2 times daily to remove allergens from the nasal cavities as well as help with mucous clearance (this is especially helpful to do before the nasal sprays are given) - We could do allergy shots in the future if needed.  - This is a curative process, but it does take some time.   3. Recurrent infections - You had an excellent response to the Pneumovax.    4. GERD - Continue with omeprazole 40mg  once daily.  5. Return in about 6 months (around 07/06/2023). You can have the follow up  appointment with Dr. Dellis Anes or a Nurse Practicioner (our Nurse Practitioners are excellent and always have Physician oversight!).    Please inform us of any Emergency Department visits, hospitalizations, or changes in symptoms. Call us before going to the ED for breathing or allergy symptoms since we might be able to fit you in for a sick visit. Feel free to contact us anytime with any questions, problems, or concerns.  It was a pleasure to see you again today!  Websites that have reliable patient information: 1. American Academy of Asthma, Allergy, and Immunology: www.aaaai.org 2. Food Allergy Research and Education (FARE): foodallergy.org 3. Mothers of Asthmatics: http://www.asthmacommunitynetwork.org 4. American College of Allergy, Asthma, and Immunology: www.acaai.org      "Like" [place] on Facebook and Instagram for our latest updates!      A healthy democracy works best when [employer] participate! Make sure you are registered to vote! If you have moved or changed any of your contact information, you will need to get this updated before voting! Scan the QR codes below to learn more!

## 2023-01-05 NOTE — Progress Notes (Signed)
FOLLOW UP  Date of Service/Encounter:  01/05/23   Assessment:   Moderate persistent asthma, uncomplicated    Perennial and seasonal allergic rhinitis (grasses, trees, indoor molds, dust mite, cat) - doing well on allergen immunotherapy  Pruritus - adding on Opzelura today  Recurrent infections    Eczema   GERD - better controlled   Hypertension - on amlodipine  Plan/Recommendations:   1. Moderate persistent asthma, uncomplicated - Lung testing looked excellent today.  - Daily controller medication(s): Advair 250/50mcg one puff twice daily - Prior to physical activity: albuterol 2 puffs 10-15 minutes before physical activity. - Rescue medications: albuterol 4 puffs every 4-6 hours as needed - Asthma control goals:  * Full participation in all desired activities (may need albuterol before activity) * Albuterol use two time or less a week on average (not counting use with activity) * Cough interfering with sleep two time or less a month * Oral steroids no more than once a year * No hospitalizations  2. Perennial and seasonal allergic rhinitis (grasses, trees, indoor molds, dust mite, cat) - Hopefully the use of Allegra before, on, and the day after. - We are adding on Opzelura to help with the itching.  - Continue with the allergy shots at the same schedule. - Spring will be totally different.  - Call us if the symptoms worsen.  - Be super aggressive with the allergy medications to see if this will help.  - Continue with: Xyzal (levocetirizine) 5mg  tablet once daily and Singulair (montelukast) 10mg  daily - Continue with: Ryaltris (olopatadine/mometasone) two sprays per nostril 1-2 times daily as needed - The nose spray contains a nasal steroid AND a nasal antihistamine.  - You can use an extra dose of the antihistamine, if needed, for breakthrough symptoms.  - Consider nasal saline rinses 1-2 times daily to remove allergens from the nasal cavities as well as help with  mucous clearance (this is especially helpful to do before the nasal sprays are given) - We could do allergy shots in the future if needed.  - This is a curative process, but it does take some time.   3. Recurrent infections - You had an excellent response to the Pneumovax.    4. GERD - Continue with omeprazole 40mg  once daily.  5. Return in about 6 months (around 07/06/2023). You can have the follow up appointment with Dr. Dellis Anes or a Nurse Practicioner (our Nurse Practitioners are excellent and always have Physician oversight!).   Subjective:   Tracy [name] is a 47 y.o. female presenting today for follow up of  Chief Complaint  Patient presents with   Allergic Rhinitis    Asthma   Follow-up    Sinus drainage, coughing at the middle of the night. Clearing of the throat    Tracy [name] has a history of the following: Patient Active Problem List   Diagnosis Date Noted   Moderate persistent asthma, uncomplicated 12/02/2021   Perennial allergic rhinitis 12/02/2021   Recurrent infections 12/02/2021   Flexural atopic dermatitis 12/02/2021   Gastroesophageal reflux disease 12/02/2021   Essential (primary) hypertension 12/09/2016    History obtained from: chart review and patient.  Discussed the use of AI scribe software for clinical note transcription with the patient and/or guardian, who gave verbal consent to proceed.  Tracy [name] is a 47 y.o. female presenting for a follow up visit.  She was last seen in July 2024 for [facility] immunotherapy.  She tolerated the procedure without any problems.  Since last visit, she has done well.  She presents with a new onset of severe itching in the palms of her hands and soles of her feet. The itching, described as deep and unrelieved by scratching, began after an increase in the dosage of her allergy shots. The itching typically lasts for a couple of hours and then subsides. The patient has been taking Allegra before her allergy shots and was  advised to take it the day before as well to see if it helps with the itching.  She is not sure if it has helped since this is the first allergy shot she has received since starting this new regimen.  The patient also mentions a high level of stress at work, which she believes might be contributing to her symptoms. She is currently applying for a new job, which could potentially reduce her work stress.  Asthma/Respiratory Symptom History: She remains on the Advair one puff BID. This seems to be working well for the most part. She has not been prednisone and has not been to the hospital for her symptoms at all. She is not having any coughing at night.  She has not had any coughing or wheezing at all.   Allergic Rhinitis Symptom History: Tracy [name] also reports feeling something in her throat, which caused her to wake up coughing due to a sensation of nasal drip. She was outside the day before the symptoms started and suspects it might be related to that exposure.  She does feel like generally her allergies have gotten better since starting the shots.  She is looking forward to how she does next spring.  The patient's allergy shots have been effective in reducing her frequency of infections, and she has not had any recent infections. She is also on Xyzal for her allergies. The patient denies needing any refills at this time.  She has not been on antibiotics for any sinus infections.  Tracy [name] is on allergen immunotherapy. She receives one injection. Immunotherapy script #1 contains trees, grasses, dust mites, and cat. She currently receives 0.2mL of the RED vial (1/100). She started shots July of 2024 and reached maintenance in October of 2024. She takes an Careers adviser before she comes in the morning. She was recommended to take Allegra on the day before and the   Otherwise, there have been no changes to her past medical history, surgical history, family history, or social history.    Review of systems otherwise  negative other than that mentioned in the HPI.    Objective:   There were no vitals taken for this visit. There is no height or weight on file to calculate BMI.    Physical Exam Vitals reviewed.  Constitutional:      Appearance: She is well-developed.     Comments: Very pleasant. Smiling.   HENT:     Head: Normocephalic and atraumatic.     Right Ear: Tympanic membrane, ear canal and external ear normal. No drainage, swelling or tenderness. Tympanic membrane is not injected, scarred, erythematous, retracted or bulging.     Left Ear: Tympanic membrane, ear canal and external ear normal. No drainage, swelling or tenderness. Tympanic membrane is not injected, scarred, erythematous, retracted or bulging.     Nose: No nasal deformity, septal deviation, mucosal edema or rhinorrhea.     Right Turbinates: Enlarged, swollen and pale.     Left Turbinates: Enlarged, swollen and pale.     Right Sinus: No maxillary sinus tenderness or frontal sinus tenderness.  Left Sinus: No maxillary sinus tenderness or frontal sinus tenderness.     Comments: No polyps. Some clear rhinorrhea.     Mouth/Throat:     Mouth: Mucous membranes are not pale and not dry.     Pharynx: Uvula midline.  Eyes:     General:        Right eye: No discharge.        Left eye: No discharge.     Conjunctiva/sclera: Conjunctivae normal.     Right eye: Right conjunctiva is not injected. No chemosis.    Left eye: Left conjunctiva is not injected. No chemosis.    Pupils: Pupils are equal, round, and reactive to light.  Cardiovascular:     Rate and Rhythm: Normal rate and regular rhythm.     Heart sounds: Normal heart sounds.  Pulmonary:     Effort: Pulmonary effort is normal. No tachypnea, accessory muscle usage or respiratory distress.     Breath sounds: Normal breath sounds. No wheezing, rhonchi or rales.     Comments: Moving air well in all lung fields.  No increased work of breathing. Chest:     Chest wall: No  tenderness.  Lymphadenopathy:     Head:     Right side of head: No submandibular, tonsillar or occipital adenopathy.     Left side of head: No submandibular, tonsillar or occipital adenopathy.     Cervical: No cervical adenopathy.  Skin:    General: Skin is warm.     Capillary Refill: Capillary refill takes less than 2 seconds.     Coloration: Skin is not pale.     Findings: No abrasion, erythema, petechiae or rash. Rash is not papular, urticarial or vesicular.     Comments: No eczematous or urticarial lesions noted.  She does have some excoriations present on the bilateral arms.  Neurological:     Mental Status: She is alert.  Psychiatric:        Behavior: Behavior is cooperative.      Diagnostic studies:   Spirometry: results normal (FEV1: 2.38/106%, FVC: 2.80/101%, FEV1/FVC: 85%).    Spirometry consistent with normal pattern.   Allergy Studies: none       Malachi Bonds, MD  [facility]

## 2023-01-10 ENCOUNTER — Ambulatory Visit (INDEPENDENT_AMBULATORY_CARE_PROVIDER_SITE_OTHER): Payer: No Typology Code available for payment source | Admitting: *Deleted

## 2023-01-10 DIAGNOSIS — J309 Allergic rhinitis, unspecified: Secondary | ICD-10-CM | POA: Diagnosis not present

## 2023-01-19 DIAGNOSIS — J3089 Other allergic rhinitis: Secondary | ICD-10-CM | POA: Diagnosis not present

## 2023-01-19 NOTE — Progress Notes (Signed)
 VIAL EXP 01-19-24

## 2023-01-21 ENCOUNTER — Ambulatory Visit
Admission: EM | Admit: 2023-01-21 | Discharge: 2023-01-21 | Disposition: A | Discharge status: Home / Self Care | Payer: No Typology Code available for payment source | Attending: Family Medicine | Admitting: Family Medicine

## 2023-01-21 ENCOUNTER — Ambulatory Visit (INDEPENDENT_AMBULATORY_CARE_PROVIDER_SITE_OTHER): Payer: No Typology Code available for payment source

## 2023-01-21 DIAGNOSIS — M542 Cervicalgia: Secondary | ICD-10-CM | POA: Diagnosis not present

## 2023-01-21 DIAGNOSIS — R0789 Other chest pain: Secondary | ICD-10-CM

## 2023-01-21 DIAGNOSIS — M2550 Pain in unspecified joint: Secondary | ICD-10-CM | POA: Diagnosis not present

## 2023-01-21 DIAGNOSIS — M1711 Unilateral primary osteoarthritis, right knee: Secondary | ICD-10-CM

## 2023-01-21 MED ORDER — PREDNISONE 20 MG PO TABS: ORAL_TABLET | ORAL | 0 refills | Status: DC

## 2023-01-21 MED ORDER — CYCLOBENZAPRINE HCL 5 MG PO TABS: 5.0000 mg | ORAL_TABLET | Freq: Every evening | ORAL | 0 refills | Status: AC | PRN
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
## 2023-01-21 NOTE — Discharge Instructions (Signed)
 Start the prednisone to help with your multiple joint pains. Use Tylenol for your headaches. The muscle relaxant at bedtime if it makes you sleepy to use during the day.

## 2023-01-25 ENCOUNTER — Ambulatory Visit (INDEPENDENT_AMBULATORY_CARE_PROVIDER_SITE_OTHER): Payer: No Typology Code available for payment source

## 2023-01-25 ENCOUNTER — Ambulatory Visit
Admission: RE | Admit: 2023-01-25 | Discharge: 2023-01-25 | Disposition: A | Discharge status: Home / Self Care | Payer: No Typology Code available for payment source | Source: Ambulatory Visit | Attending: Family Medicine | Admitting: Family Medicine

## 2023-01-25 VITALS — BP 150/94 | HR 76 | Temp 99.3°F | Resp 16

## 2023-01-25 DIAGNOSIS — M5441 Lumbago with sciatica, right side: Secondary | ICD-10-CM | POA: Diagnosis not present

## 2023-01-25 MED ORDER — METHYLPREDNISOLONE ACETATE 80 MG/ML IJ SUSP
80.0000 mg | Freq: Once | INTRAMUSCULAR | Status: AC
  Administered 2023-01-25: 80 mg via INTRAMUSCULAR

## 2023-01-25 NOTE — Discharge Instructions (Addendum)
 Hold the prednisone today and tomorrow. Restart it Friday and Saturday.
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
## 2023-01-30 ENCOUNTER — Ambulatory Visit (INDEPENDENT_AMBULATORY_CARE_PROVIDER_SITE_OTHER): Payer: No Typology Code available for payment source | Admitting: Emergency Medicine

## 2023-01-30 ENCOUNTER — Encounter: Payer: Self-pay | Admitting: Emergency Medicine

## 2023-01-30 VITALS — BP 132/94 | HR 106 | Temp 98.5°F | Ht 61.8 in | Wt 173.0 lb

## 2023-01-30 DIAGNOSIS — M549 Dorsalgia, unspecified: Secondary | ICD-10-CM | POA: Diagnosis not present

## 2023-01-30 DIAGNOSIS — I10 Essential (primary) hypertension: Secondary | ICD-10-CM

## 2023-01-30 DIAGNOSIS — T07XXXA Unspecified multiple injuries, initial encounter: Secondary | ICD-10-CM | POA: Diagnosis not present

## 2023-01-30 MED ORDER — TRAMADOL HCL 50 MG PO TABS: 50.0000 mg | ORAL_TABLET | Freq: Three times a day (TID) | ORAL | 0 refills | Status: AC | PRN
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
## 2023-01-30 NOTE — Patient Instructions (Signed)
 Acute Back Pain, Adult Acute back pain is sudden and usually short-lived. It is often caused by an injury to the muscles and tissues in the back. The injury may result from: A muscle, tendon, or ligament getting overstretched or torn. Ligaments are tissues that connect bones to each other. Lifting something improperly can cause a back strain. Wear and tear (degeneration) of the spinal disks. Spinal disks are circular tissue that provide cushioning between the bones of the spine (vertebrae). Twisting motions, such as while playing sports or doing yard work. A hit to the back. Arthritis. You may have a physical exam, lab tests, and imaging tests to find the cause of your pain. Acute back pain usually goes away with rest and home care. Follow these instructions at home: Managing pain, stiffness, and swelling Take over-the-counter and prescription medicines only as told by your health care provider. Treatment may include medicines for pain and inflammation that are taken by mouth or applied to the skin, or muscle relaxants. Your health care provider may recommend applying ice during the first 24-48 hours after your pain starts. To do this: Put ice in a plastic bag. Place a towel between your skin and the bag. Leave the ice on for 20 minutes, 2-3 times a day. Remove the ice if your skin turns bright red. This is very important. If you cannot feel pain, heat, or cold, you have a greater risk of damage to the area. If directed, apply heat to the affected area as often as told by your health care provider. Use the heat source that your health care provider recommends, such as a moist heat pack or a heating pad. Place a towel between your skin and the heat source. Leave the heat on for 20-30 minutes. Remove the heat if your skin turns bright red. This is especially important if you are unable to feel pain, heat, or cold. You have a greater risk of getting burned. Activity  Do not stay in bed. Staying in  bed for more than 1-2 days can delay your recovery. Sit up and stand up straight. Avoid leaning forward when you sit or hunching over when you stand. If you work at a desk, sit close to it so you do not need to lean over. Keep your chin tucked in. Keep your neck drawn back, and keep your elbows bent at a 90-degree angle (right angle). Sit high and close to the steering wheel when you drive. Add lower back (lumbar) support to your car seat, if needed. Take short walks on even surfaces as soon as you are able. Try to increase the length of time you walk each day. Do not sit, drive, or stand in one place for more than 30 minutes at a time. Sitting or standing for long periods of time can put stress on your back. Do not drive or use heavy machinery while taking prescription pain medicine. Use proper lifting techniques. When you bend and lift, use positions that put less stress on your back: [place] your knees. Keep the load close to your body. Avoid twisting. Exercise regularly as told by your health care provider. Exercising helps your back heal faster and helps prevent back injuries by keeping muscles strong and flexible. Work with a physical therapist to make a safe exercise program, as recommended by your health care provider. Do any exercises as told by your physical therapist. Lifestyle Maintain a healthy weight. Extra weight puts stress on your back and makes it difficult to have good  posture. Avoid activities or situations that make you feel anxious or stressed. Stress and anxiety increase muscle tension and can make back pain worse. Learn ways to manage anxiety and stress, such as through exercise. General instructions Sleep on a firm mattress in a comfortable position. Try lying on your side with your knees slightly bent. If you lie on your back, put a pillow under your knees. Keep your head and neck in a straight line with your spine (neutral position) when using electronic equipment like  smartphones or pads. To do this: Raise your smartphone or pad to look at it instead of bending your head or neck to look down. Put the smartphone or pad at the level of your face while looking at the screen. Follow your treatment plan as told by your health care provider. This may include: Cognitive or behavioral therapy. Acupuncture or massage therapy. Meditation or yoga. Contact a health care provider if: You have pain that is not relieved with rest or medicine. You have increasing pain going down into your legs or buttocks. Your pain does not improve after 2 weeks. You have pain at night. You lose weight without trying. You have a fever or chills. You develop nausea or vomiting. You develop abdominal pain. Get help right away if: You develop new bowel or bladder control problems. You have unusual weakness or numbness in your arms or legs. You feel faint. These symptoms may represent a serious problem that is an emergency. Do not wait to see if the symptoms will go away. Get medical help right away. Call your local emergency services (911 in the [place] Do not drive yourself to the hospital. Summary Acute back pain is sudden and usually short-lived. Use proper lifting techniques. When you bend and lift, use positions that put less stress on your back. Take over-the-counter and prescription medicines only as told by your health care provider, and apply heat or ice as told. This information is not intended to replace advice given to you by your health care provider. Make sure you discuss any questions you have with your health care provider. Document Revised: 03/27/2020 Document Reviewed: 03/27/2020 Elsevier Patient Education  2024 [employer].

## 2023-01-30 NOTE — Assessment & Plan Note (Signed)
 Pain management discussed. Advised to use heat pad frequently during the day Limited work activities FMLA work restrictions form completed Pain management discussed Has appointment with spinal surgeon 03/02/2023 Advised to contact office if no better or worse during the next couple weeks

## 2023-01-30 NOTE — Progress Notes (Deleted)
 [name] [address] [place] [address] Phone: [phone]   Assessment and Plan:     There are no diagnoses linked to this encounter.  ***   Pertinent previous records reviewed include ***    Follow Up: ***     Subjective:   I, Tracy [name], am serving as a neurosurgeon for Doctor Morene Mace   Chief Complaint: right knee pain    HPI:    12/20/22 Patient is a 48 year old female with right knee pain. Patient states pain for years, but over the last couple of months pain has been increasing. Notes her knee sounds like popcorn. She just moved and has a two story house and thinks that could have aggravated her knee. No MOI. Notes her knee is swollen. Patellar tendon pain. When she kneels down she isnt able to put pressure on it. Tylenol  and advil  for the pain but only a short temporary relief. Has been using heating pad   01/31/2023 Patient states   Relevant Historical Information: Hypertension, GERD   Additional pertinent review of systems negative.   Current Outpatient Medications:    acetaminophen  (TYLENOL ) 500 MG tablet, Take 500 mg by mouth every 6 (six) hours as needed., Disp: , Rfl:    Albuterol -Budesonide (AIRSUPRA ) 90-80 MCG/ACT AERO, Inhale 2 puffs into the lungs every 4 (four) hours as needed., Disp: 10.7 g, Rfl: 3   amLODipine  (NORVASC ) 10 MG tablet, TAKE 1 TABLET BY MOUTH EVERY DAY, Disp: 90 tablet, Rfl: 2   benzonatate  (TESSALON  PERLES) 100 MG capsule, Take 1 capsule (100 mg total) by mouth 3 (three) times daily as needed for cough., Disp: 20 capsule, Rfl: 0   betamethasone  valerate ointment (VALISONE ) 0.1 %, Apply 1 Application topically 2 (two) times daily., Disp: 30 g, Rfl: 5   cyclobenzaprine  (FLEXERIL ) 5 MG tablet, Take 1 tablet (5 mg total) by mouth at bedtime as needed for muscle spasms., Disp: 30 tablet, Rfl: 0   DULoxetine  (CYMBALTA ) 30 MG capsule, TAKE 1 CAPSULE EVERY DAY, Disp: 90 capsule,  Rfl: 0   EPINEPHrine  0.3 mg/0.3 mL IJ SOAJ injection, Inject 0.3 mg into the muscle as needed for anaphylaxis., Disp: 1 each, Rfl: 2   fluticasone  (FLONASE ) 50 MCG/ACT nasal spray, Place 1 spray into both nostrils daily., Disp: 47.4 mL, Rfl: 1   fluticasone -salmeterol (ADVAIR DISKUS) 250-50 MCG/ACT AEPB, Inhale 1 puff into the lungs in the morning and at bedtime., Disp: 60 each, Rfl: 5   levocetirizine (XYZAL ) 5 MG tablet, TAKE 1 TABLET BY MOUTH EVERY DAY IN THE EVENING, Disp: 90 tablet, Rfl: 0   lisinopril -hydrochlorothiazide  (ZESTORETIC ) 20-12.5 MG tablet, Take 1 tablet by mouth daily., Disp: 90 tablet, Rfl: 3   meloxicam  (MOBIC ) 15 MG tablet, Take 1 tablet (15 mg total) by mouth daily., Disp: 30 tablet, Rfl: 2   montelukast  (SINGULAIR ) 10 MG tablet, Take 1 tablet (10 mg total) by mouth at bedtime., Disp: 90 tablet, Rfl: 1   Multiple Vitamin (MULTIVITAMIN WITH MINERALS) TABS tablet, Take 1 tablet by mouth daily., Disp: , Rfl:    omeprazole  (PRILOSEC) 40 MG capsule, TAKE 1 CAPSULE (40 MG TOTAL) BY MOUTH DAILY., Disp: 90 capsule, Rfl: 1   predniSONE  (DELTASONE ) 20 MG tablet, Take 2 tablets daily with breakfast., Disp: 10 tablet, Rfl: 0   Ruxolitinib Phosphate  (OPZELURA ) 1.5 % CREA, Apply 1 Application topically 2 (two) times daily as needed., Disp: 60 g, Rfl: 1   RYALTRIS  665-25 MCG/ACT SUSP, 2  sprays per nostril 1-2 times daily as needed., Disp: 29 g, Rfl: 5   tacrolimus  (PROTOPIC ) 0.1 % ointment, Apply topically 2 (two) times daily., Disp: 100 g, Rfl: 5   Objective:     There were no vitals filed for this visit.    There is no height or weight on file to calculate BMI.    Physical Exam:    ***   Electronically signed by:  Odis Mace D.CLEMENTEEN [name] 2:53 PM 01/30/23

## 2023-01-30 NOTE — Assessment & Plan Note (Signed)
 BP Readings from Last 3 Encounters:  01/30/23 (!) 132/94  01/25/23 (!) 150/94  01/21/23 (!) 162/95  Pain contributing to elevation Continue amlodipine 10 mg daily and Zestoretic 20-12.5 mg daily

## 2023-01-31 ENCOUNTER — Ambulatory Visit: Payer: No Typology Code available for payment source | Admitting: Sports Medicine

## 2023-01-31 ENCOUNTER — Other Ambulatory Visit: Payer: Self-pay | Admitting: Radiology

## 2023-01-31 ENCOUNTER — Other Ambulatory Visit: Payer: Self-pay | Admitting: Emergency Medicine

## 2023-01-31 ENCOUNTER — Ambulatory Visit: Payer: Self-pay | Admitting: Emergency Medicine

## 2023-01-31 DIAGNOSIS — M549 Dorsalgia, unspecified: Secondary | ICD-10-CM

## 2023-01-31 DIAGNOSIS — T07XXXA Unspecified multiple injuries, initial encounter: Secondary | ICD-10-CM

## 2023-01-31 MED ORDER — HYDROCODONE-ACETAMINOPHEN 5-325 MG PO TABS: 1.0000 | ORAL_TABLET | Freq: Four times a day (QID) | ORAL | 0 refills | Status: DC | PRN

## 2023-01-31 NOTE — Telephone Encounter (Signed)
 Place referral as requested.  I will send a new prescription to her pharmacy of record today.  Thanks.

## 2023-01-31 NOTE — Telephone Encounter (Signed)
 We saw her yesterday.  Prescribed pain medication.  She has an appointment to see [facility] next month.  FMLA papers were filled out with her assistance yesterday with restrictions.  What else does she need?

## 2023-01-31 NOTE — Telephone Encounter (Signed)
 Referral placed. Patient notified.

## 2023-01-31 NOTE — Telephone Encounter (Signed)
 Copied from CRM [id number]. Topic: Referral - Request for Referral >> Jan 31, 2023  9:03 AM Rolin D wrote: Did the patient discuss referral with their provider in the last year? Yes (If No - schedule appointment) (If Yes - send message)  Appointment offered? No  Type of order/referral and detailed reason for visit: Spine Specialist   Preference of office, provider, location: [facility] - Dr.Jenkins  If referral order, have you been seen by this specialty before? No (If Yes, this issue or another issue? When? Where?  Can we respond through MyChart? Yes >> Jan 31, 2023  9:05 AM Rolin D wrote: Patient stated she is in extreme back pain and is unable to work .
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
## 2023-02-03 ENCOUNTER — Telehealth: Payer: Self-pay | Admitting: Emergency Medicine

## 2023-02-03 ENCOUNTER — Ambulatory Visit (INDEPENDENT_AMBULATORY_CARE_PROVIDER_SITE_OTHER): Payer: Self-pay

## 2023-02-03 DIAGNOSIS — J309 Allergic rhinitis, unspecified: Secondary | ICD-10-CM

## 2023-02-03 NOTE — Telephone Encounter (Unsigned)
Copied from CRM [id number]. Topic: General - Other >> Feb 03, 2023  1:20 PM Larwance Sachs wrote: Reason for CRM: Patient called in regarding FMLA paperwork being written over due to 4 hours being to much time to work and Is causing patient severe pain while working , updated patient it take up to 7-10 business days for the paperwork to be completed per CAL. Patient states she understands time frame but does seem really concerned regarding this

## 2023-02-06 NOTE — Telephone Encounter (Signed)
LVM for patient to call back Regarding FMLA forms. Needed to know how many hours per day she is requesting to be written down

## 2023-02-08 NOTE — Telephone Encounter (Signed)
LVM for patient to call back regarding FMLA forms. New hours working is needed

## 2023-02-09 ENCOUNTER — Ambulatory Visit (INDEPENDENT_AMBULATORY_CARE_PROVIDER_SITE_OTHER): Payer: No Typology Code available for payment source | Admitting: *Deleted

## 2023-02-09 DIAGNOSIS — J309 Allergic rhinitis, unspecified: Secondary | ICD-10-CM

## 2023-02-25 ENCOUNTER — Other Ambulatory Visit: Payer: Self-pay | Admitting: Allergy & Immunology

## 2023-03-01 ENCOUNTER — Telehealth: Payer: Self-pay | Admitting: Pharmacy Technician

## 2023-03-01 ENCOUNTER — Other Ambulatory Visit (HOSPITAL_COMMUNITY): Payer: Self-pay

## 2023-03-01 NOTE — Telephone Encounter (Signed)
Pharmacy Patient Advocate Encounter  Received notification from [facility] that Prior Authorization for HYDROcodone-Acetaminophen 5-325MG  tablets  has been APPROVED from 03/01/2023 to 08/28/2023   PA #/Case ID/Reference #: [id number]

## 2023-03-01 NOTE — Telephone Encounter (Signed)
Pharmacy Patient Advocate Encounter   Received notification from CoverMyMeds that prior authorization for HYDROcodone-Acetaminophen 5-325MG  tablets is required/requested.   Insurance verification completed.   The patient is insured through [facility] .   Per test claim: PA required; PA submitted to above mentioned insurance via CoverMyMeds Key/confirmation #/EOC [id number] Status is pending

## 2023-03-06 ENCOUNTER — Other Ambulatory Visit (HOSPITAL_COMMUNITY): Payer: Self-pay

## 2023-03-27 ENCOUNTER — Other Ambulatory Visit: Payer: Self-pay | Admitting: Allergy & Immunology

## 2023-03-28 ENCOUNTER — Other Ambulatory Visit: Payer: Self-pay | Admitting: Obstetrics and Gynecology

## 2023-03-28 DIAGNOSIS — Z1231 Encounter for screening mammogram for malignant neoplasm of breast: Secondary | ICD-10-CM

## 2023-04-07 ENCOUNTER — Ambulatory Visit
Admission: RE | Admit: 2023-04-07 | Discharge: 2023-04-07 | Disposition: A | Discharge status: Home / Self Care | Source: Ambulatory Visit | Attending: Obstetrics and Gynecology | Admitting: Obstetrics and Gynecology

## 2023-04-07 DIAGNOSIS — Z1231 Encounter for screening mammogram for malignant neoplasm of breast: Secondary | ICD-10-CM

## 2023-04-12 ENCOUNTER — Ambulatory Visit (INDEPENDENT_AMBULATORY_CARE_PROVIDER_SITE_OTHER): Payer: Self-pay | Admitting: *Deleted

## 2023-04-12 DIAGNOSIS — J309 Allergic rhinitis, unspecified: Secondary | ICD-10-CM | POA: Diagnosis not present

## 2023-04-25 ENCOUNTER — Ambulatory Visit: Payer: Self-pay

## 2023-04-25 ENCOUNTER — Ambulatory Visit (INDEPENDENT_AMBULATORY_CARE_PROVIDER_SITE_OTHER): Payer: Self-pay

## 2023-04-25 DIAGNOSIS — J309 Allergic rhinitis, unspecified: Secondary | ICD-10-CM

## 2023-05-01 ENCOUNTER — Telehealth

## 2023-05-04 ENCOUNTER — Ambulatory Visit (INDEPENDENT_AMBULATORY_CARE_PROVIDER_SITE_OTHER): Payer: Self-pay

## 2023-05-04 DIAGNOSIS — J309 Allergic rhinitis, unspecified: Secondary | ICD-10-CM

## 2023-05-19 DIAGNOSIS — M5416 Radiculopathy, lumbar region: Secondary | ICD-10-CM | POA: Diagnosis not present

## 2023-06-01 ENCOUNTER — Ambulatory Visit (INDEPENDENT_AMBULATORY_CARE_PROVIDER_SITE_OTHER)

## 2023-06-01 DIAGNOSIS — J309 Allergic rhinitis, unspecified: Secondary | ICD-10-CM | POA: Diagnosis not present

## 2023-06-29 ENCOUNTER — Telehealth: Payer: Self-pay

## 2023-06-29 ENCOUNTER — Encounter: Payer: Self-pay | Admitting: Family

## 2023-06-29 ENCOUNTER — Ambulatory Visit (INDEPENDENT_AMBULATORY_CARE_PROVIDER_SITE_OTHER): Admitting: Family

## 2023-06-29 VITALS — BP 119/88 | HR 92 | Temp 98.6°F | Resp 16 | Ht 62.0 in | Wt 173.0 lb

## 2023-06-29 DIAGNOSIS — M5186 Other intervertebral disc disorders, lumbar region: Secondary | ICD-10-CM | POA: Diagnosis not present

## 2023-06-29 DIAGNOSIS — M85679 Other cyst of bone, unspecified ankle and foot: Secondary | ICD-10-CM

## 2023-06-29 DIAGNOSIS — R635 Abnormal weight gain: Secondary | ICD-10-CM

## 2023-06-29 DIAGNOSIS — D235 Other benign neoplasm of skin of trunk: Secondary | ICD-10-CM

## 2023-06-29 DIAGNOSIS — I10 Essential (primary) hypertension: Secondary | ICD-10-CM | POA: Diagnosis not present

## 2023-06-29 DIAGNOSIS — J45909 Unspecified asthma, uncomplicated: Secondary | ICD-10-CM

## 2023-06-29 DIAGNOSIS — Z6831 Body mass index (BMI) 31.0-31.9, adult: Secondary | ICD-10-CM

## 2023-06-29 DIAGNOSIS — Z7689 Persons encountering health services in other specified circumstances: Secondary | ICD-10-CM

## 2023-06-29 DIAGNOSIS — N83201 Unspecified ovarian cyst, right side: Secondary | ICD-10-CM

## 2023-06-29 DIAGNOSIS — M51369 Other intervertebral disc degeneration, lumbar region without mention of lumbar back pain or lower extremity pain: Secondary | ICD-10-CM

## 2023-06-29 MED ORDER — LISINOPRIL-HYDROCHLOROTHIAZIDE 20-12.5 MG PO TABS: 1.0000 | ORAL_TABLET | Freq: Every day | ORAL | 0 refills | Status: DC

## 2023-06-29 MED ORDER — PHENTERMINE HCL 15 MG PO CAPS: 15.0000 mg | ORAL_CAPSULE | ORAL | 0 refills | Status: DC

## 2023-06-29 MED ORDER — AMLODIPINE BESYLATE 10 MG PO TABS
10.0000 mg | ORAL_TABLET | Freq: Every day | ORAL | 0 refills | Status: AC
Start: 2023-06-29 — End: ?

## 2023-06-29 NOTE — Telephone Encounter (Signed)
 Copied from CRM [id number]. Topic: Clinical - Prescription Issue >> Jun 29, 2023  2:05 PM Ivette P wrote: Reason for CRM: PT called in because was seen by her primary and was prescribed the following medication:  lisinopril -hydrochlorothiazide  (ZESTORETIC ) 20-12.5 MG tablet phentermine 15 MG capsule amLODipine  (NORVASC ) 10 MG tablet  [facility].   Pt would [facility] medication [facility]   [facility]:  [email or web address] - [place], [place] - [address] [place] [address] Phone: [phone] Fax: [phone]
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
## 2023-06-29 NOTE — Progress Notes (Signed)
Cyst on back

## 2023-06-30 ENCOUNTER — Ambulatory Visit (INDEPENDENT_AMBULATORY_CARE_PROVIDER_SITE_OTHER): Payer: Self-pay

## 2023-06-30 ENCOUNTER — Ambulatory Visit: Payer: Self-pay | Admitting: Family

## 2023-06-30 DIAGNOSIS — J309 Allergic rhinitis, unspecified: Secondary | ICD-10-CM

## 2023-06-30 LAB — BASIC METABOLIC PANEL WITH GFR
BUN/Creatinine Ratio: 15 (ref 9–23)
BUN: 13 mg/dL (ref 6–24)
CO2: 25 mmol/L (ref 20–29)
Calcium: 10.2 mg/dL (ref 8.7–10.2)
Chloride: 100 mmol/L (ref 96–106)
Creatinine, Ser: 0.84 mg/dL (ref 0.57–1.00)
Glucose: 160 mg/dL — ABNORMAL HIGH (ref 70–99)
Potassium: 3.7 mmol/L (ref 3.5–5.2)
Sodium: 140 mmol/L (ref 134–144)
eGFR: 85 mL/min/{1.73_m2} (ref >59)

## 2023-07-03 ENCOUNTER — Other Ambulatory Visit: Payer: Self-pay

## 2023-07-03 ENCOUNTER — Ambulatory Visit (INDEPENDENT_AMBULATORY_CARE_PROVIDER_SITE_OTHER): Admitting: Podiatry

## 2023-07-03 ENCOUNTER — Encounter: Payer: Self-pay | Admitting: Podiatry

## 2023-07-03 ENCOUNTER — Other Ambulatory Visit: Payer: Self-pay | Admitting: Family

## 2023-07-03 ENCOUNTER — Ambulatory Visit (INDEPENDENT_AMBULATORY_CARE_PROVIDER_SITE_OTHER)

## 2023-07-03 DIAGNOSIS — R635 Abnormal weight gain: Secondary | ICD-10-CM

## 2023-07-03 DIAGNOSIS — M778 Other enthesopathies, not elsewhere classified: Secondary | ICD-10-CM

## 2023-07-03 DIAGNOSIS — I10 Essential (primary) hypertension: Secondary | ICD-10-CM

## 2023-07-03 DIAGNOSIS — Z7689 Persons encountering health services in other specified circumstances: Secondary | ICD-10-CM

## 2023-07-03 DIAGNOSIS — Z6831 Body mass index (BMI) 31.0-31.9, adult: Secondary | ICD-10-CM

## 2023-07-03 DIAGNOSIS — M25571 Pain in right ankle and joints of right foot: Secondary | ICD-10-CM | POA: Diagnosis not present

## 2023-07-03 DIAGNOSIS — M7671 Peroneal tendinitis, right leg: Secondary | ICD-10-CM

## 2023-07-03 MED ORDER — PHENTERMINE HCL 15 MG PO CAPS: 15.0000 mg | ORAL_CAPSULE | ORAL | 0 refills | Status: DC

## 2023-07-03 MED ORDER — AMLODIPINE BESYLATE 10 MG PO TABS: 10.0000 mg | ORAL_TABLET | Freq: Every day | ORAL | 0 refills | Status: DC

## 2023-07-03 MED ORDER — TRIAMCINOLONE ACETONIDE 10 MG/ML IJ SUSP
10.0000 mg | Freq: Once | INTRAMUSCULAR | Status: AC
  Administered 2023-07-03: 10 mg

## 2023-07-03 MED ORDER — PHENTERMINE HCL 15 MG PO CAPS
15.0000 mg | ORAL_CAPSULE | ORAL | 0 refills | Status: DC
Start: 2023-07-03 — End: 2023-07-25

## 2023-07-03 MED ORDER — LISINOPRIL-HYDROCHLOROTHIAZIDE 20-12.5 MG PO TABS: 1.0000 | ORAL_TABLET | Freq: Every day | ORAL | 0 refills | Status: DC

## 2023-07-03 NOTE — Progress Notes (Signed)
 Presents with a complaint of pain in the area of the sinus tarsi and lateral ankle of the right foot.  Says she thinks might have a cyst there.  Says she cannot really noticed that today.  Says it seems to be intermittent.  Has had pain there that with walking and palpation.  Painful when walking today.  Does not recall any injury to the foot.  Does not notice any redness or ecchymosis.   Physical exam:  General appearance: Pleasant, and in no acute distress. AOx3.  Vascular: Pedal pulses: DP 2/4 bilaterally, PT 2/4 bilaterally.  Minimal edema lower legs bilaterally. Capillary fill time immediate.  Neurological: Light touch intact feet bilaterally.  Normal Achilles reflex bilaterally.  No clonus or spasticity noted.  Negative Tinel's sign tarsal tunnel right and sural nerve right.  Dermatologic:   Skin normal temperature bilaterally.  Skin normal color, tone, and texture bilaterally.   Musculoskeletal: Tenderness to palpation and range of motion subtalar joint right.  Some tenderness along the peroneal tendons right at the distal tip of the fibula.  Normal muscle strength around foot and ankle.  No crepitation noted with range of motion  Radiographs: 3 views right foot: No evidence of any fractures or dislocations.  Normal bone density.  Osteophytic change dorsal aspect first metatarsal head with mild joint space narrowing at the first MTP.Aaron Aas  Moderate hallux abductovalgus right.  Normal subtalar joint space.  No evidence any bone tumors  Diagnosis: 1.  Peroneal tendinitis right foot/ankle 2.  Arthralgia right subtalar joint. 3. Capsulitis right foot.  Plan: -New patient office visit level 3 for evaluation and management.  Modifier 25 -This with her pain in the subtalar joint along peroneal tendon right.  Discussed with peroneal tendinitis probably secondary to the subtalar joint pain.  Recommended RICE.  I palpated a ganglion cyst today but there is a possibility there could be 1 there   that is intermittent.  If you notices what she thinks is a cyst appear again she can call to get it checked -injected 3cc 2:1 mixture 0.5 cc Marcaine :Kenolog 10mg /30ml at Subtalar Joint Right at Sinus Tarsi.    Return 2 weeks

## 2023-07-03 NOTE — Telephone Encounter (Signed)
 Complete

## 2023-07-03 NOTE — Telephone Encounter (Signed)
 Called patient and notified her that medication had been switched to preferred pharmacy

## 2023-07-06 ENCOUNTER — Ambulatory Visit (INDEPENDENT_AMBULATORY_CARE_PROVIDER_SITE_OTHER): Payer: No Typology Code available for payment source | Admitting: Allergy & Immunology

## 2023-07-06 VITALS — BP 122/88 | HR 80 | Temp 97.9°F | Resp 16 | Ht 62.4 in | Wt 172.3 lb

## 2023-07-06 DIAGNOSIS — J3089 Other allergic rhinitis: Secondary | ICD-10-CM | POA: Diagnosis not present

## 2023-07-06 DIAGNOSIS — K219 Gastro-esophageal reflux disease without esophagitis: Secondary | ICD-10-CM | POA: Diagnosis not present

## 2023-07-06 DIAGNOSIS — J4540 Moderate persistent asthma, uncomplicated: Secondary | ICD-10-CM

## 2023-07-06 DIAGNOSIS — L2089 Other atopic dermatitis: Secondary | ICD-10-CM | POA: Diagnosis not present

## 2023-07-06 DIAGNOSIS — J302 Other seasonal allergic rhinitis: Secondary | ICD-10-CM

## 2023-07-06 MED ORDER — FEXOFENADINE HCL 180 MG PO TABS: 180.0000 mg | ORAL_TABLET | Freq: Every day | ORAL | 3 refills | Status: AC

## 2023-07-06 MED ORDER — EPINEPHRINE 0.3 MG/0.3ML IJ SOAJ: 0.3000 mg | INTRAMUSCULAR | 1 refills | Status: AC | PRN

## 2023-07-06 MED ORDER — FLUTICASONE PROPIONATE 50 MCG/ACT NA SUSP: 1.0000 | Freq: Every day | NASAL | 1 refills | Status: AC

## 2023-07-06 MED ORDER — FLUTICASONE-SALMETEROL 250-50 MCG/ACT IN AEPB: 1.0000 | INHALATION_SPRAY | Freq: Two times a day (BID) | RESPIRATORY_TRACT | 5 refills | Status: AC

## 2023-07-06 MED ORDER — LEVOCETIRIZINE DIHYDROCHLORIDE 5 MG PO TABS: 5.0000 mg | ORAL_TABLET | Freq: Every day | ORAL | 1 refills | Status: AC | PRN

## 2023-07-06 MED ORDER — OMEPRAZOLE 40 MG PO CPDR: 40.0000 mg | DELAYED_RELEASE_CAPSULE | Freq: Every morning | ORAL | 1 refills | Status: DC

## 2023-07-06 MED ORDER — AIRSUPRA 90-80 MCG/ACT IN AERO: 2.0000 | INHALATION_SPRAY | RESPIRATORY_TRACT | 3 refills | Status: AC | PRN

## 2023-07-06 MED ORDER — TACROLIMUS 0.1 % EX OINT: TOPICAL_OINTMENT | Freq: Two times a day (BID) | CUTANEOUS | 1 refills | Status: AC

## 2023-07-06 NOTE — Progress Notes (Unsigned)
 FOLLOW UP  Date of Service/Encounter:  07/06/23   Assessment:   Moderate persistent asthma, uncomplicated    Perennial and seasonal allergic rhinitis (grasses, trees, indoor molds, dust mite, cat) - doing well on allergen immunotherapy   Pruritus - adding on Opzelura  today   Recurrent infections    Eczema   GERD - better controlled   Hypertension - on amlodipine   Plan/Recommendations:   Patient Instructions  1. Moderate persistent asthma, uncomplicated - Lung testing looked excellent today.  - I am so glad that you are doing better!  - Daily controller medication(s): Advair 250/49mcg one puff twice daily - Prior to physical activity: AirSupra  2 puffs 10-15 minutes before physical activity. - Rescue medications: AirSupra  2 puffs every 4-6 hours as needed - Asthma control goals:  * Full participation in all desired activities (may need albuterol  before activity) * Albuterol  use two time or less a week on average (not counting use with activity) * Cough interfering with sleep two time or less a month * Oral steroids no more than once a year * No hospitalizations  2. Perennial and seasonal allergic rhinitis (grasses, trees, indoor molds, dust mite, cat) - I am glad that the allergy  shots are going so well. - Try stopping the Singulair  (montelukast ) and go for one month.  - Continue with: Xyzal  (levocetirizine) 5mg  tablet once daily and Allegra  (fexofenadine ) 180mg  once daily - Continue with: Ryaltris  (olopatadine/mometasone) two sprays per nostril 1-2 times daily as needed - The nose spray contains a nasal steroid AND a nasal antihistamine.  - You can use an extra dose of the antihistamine, if needed, for breakthrough symptoms.  - Consider nasal saline rinses 1-2 times daily to remove allergens from the nasal cavities as well as help with mucous clearance (this is especially helpful to do before the nasal sprays are given)  3. Recurrent infections - You had an excellent  response to the Pneumovax.    4. GERD - Continue with omeprazole  40mg  once daily.  5. Return in about 6 months (around 01/05/2024). You can have the follow up appointment with Dr. Idolina Maker or a Nurse Practicioner (our Nurse Practitioners are excellent and always have Physician oversight!).    Please inform us  of any Emergency Department visits, hospitalizations, or changes in symptoms. Call us  before going to the ED for breathing or allergy  symptoms since we might be able to fit you in for a sick visit. Feel free to contact us  anytime with any questions, problems, or concerns.  It was a pleasure to see you again today!  Websites that have reliable patient information: 1. American Academy of Asthma, Allergy , and Immunology: www.aaaai.org 2. Food Allergy  Research and Education (FARE): foodallergy.org 3. Mothers of Asthmatics: http://www.asthmacommunitynetwork.org 4. American College of Allergy , Asthma, and Immunology: www.acaai.org      "Like" us  on Facebook and Instagram for our latest updates!      A healthy democracy works best when [employer] participate! Make sure you are registered to vote! If you have moved or changed any of your contact information, you will need to get this updated before voting! Scan the QR codes below to learn more!            Subjective:   Tracy [name] is a 48 y.o. female presenting today for follow up of  Chief Complaint  Patient presents with  . Asthma    Says she is well. No issues or concerns at this time.  . Allergic Rhinitis  Says she is well. No issues or concerns at this time.    Tracy [name] has a history of the following: Patient Active Problem List   Diagnosis Date Noted  . Multiple contusions 01/30/2023  . Musculoskeletal back pain 01/30/2023  . Moderate persistent asthma, uncomplicated 12/02/2021  . Perennial allergic rhinitis 12/02/2021  . Recurrent infections 12/02/2021  . Flexural atopic dermatitis 12/02/2021   . Gastroesophageal reflux disease 12/02/2021  . Essential (primary) hypertension 12/09/2016    History obtained from: chart review and {Persons; PED relatives w/patient:19415::patient}.  Discussed the use of AI scribe software for clinical note transcription with the patient and/or guardian, who gave verbal consent to proceed.  Tracy [name] is a 48 y.o. female presenting for {Blank single:19197::a food challenge,a drug challenge,skin testing,a sick visit,an evaluation of ***,a follow up visit}.  She was last seen in December 2024.  At that time, Testing was great.  We continue with Advair 250 mcg 1 puff twice daily and albuterol  as needed.  For her rhinitis, we continue with allergy  shots at the same schedule.  We also continue with Xyzal  5 mg daily and montelukast  10 mg daily.  We continue with her Ryaltris .  Her infections were under good control with no breakthrough infections since I saw her last time.  GERD was controlled with omeprazole .  Since last visit,  Asthma/Respiratory Symptom History: ***  Allergic Rhinitis Symptom History: ***  Tracy [name] is on allergen immunotherapy. She receives one injection. Immunotherapy script #1 contains trees, grasses, dust mites, and cat. She currently receives 0.50mL of the RED vial (1/100). She started shots July of 2024 and reached maintenance in October of 2024. She takes an Allegra  before she comes in the morning. She was recommended to take Allegra  on the day before and the    Food Allergy  Symptom History: ***  Skin Symptom History: ***  GERD Symptom History: ***  Infection Symptom History: ***  Otherwise, there have been no changes to her past medical history, surgical history, family history, or social history.    Review of systems otherwise negative other than that mentioned in the HPI.    Objective:   Blood pressure 122/88, pulse 80, temperature 97.9 F (36.6 C), temperature source Temporal, resp. rate 16, height 5' 2.4  (1.585 m), weight 172 lb 4.8 oz (78.2 kg), SpO2 98%. Body mass index is 31.11 kg/m.    Physical Exam   Diagnostic studies:    Spirometry: results normal (FEV1: 2.32/104%, FVC: 2.79/101%, FEV1/FVC: 83%).    Spirometry consistent with normal pattern. {Blank single:19197::Albuterol /Atrovent nebulizer,Xopenex/Atrovent nebulizer,Albuterol  nebulizer,Albuterol  four puffs via MDI,Xopenex four puffs via MDI} treatment given in clinic with {Blank single:19197::significant improvement in FEV1 per ATS criteria,significant improvement in FVC per ATS criteria,significant improvement in FEV1 and FVC per ATS criteria,improvement in FEV1, but not significant per ATS criteria,improvement in FVC, but not significant per ATS criteria,improvement in FEV1 and FVC, but not significant per ATS criteria,no improvement}.  Allergy  Studies: {Blank single:19197::none,deferred due to recent antihistamine use,deferred due to insurance stipulations that require a separate visit for testing,labs sent instead, }    {Blank single:19197::Allergy  testing results were read and interpreted by myself, documented by clinical staff., }      Drexel Gentles, MD  [facility] 

## 2023-07-06 NOTE — Patient Instructions (Addendum)
 1. Moderate persistent asthma, uncomplicated - Lung testing looked excellent today.  - I am so glad that you are doing better!  - Daily controller medication(s): Advair 250/2mcg one puff twice daily - Prior to physical activity: AirSupra  2 puffs 10-15 minutes before physical activity. - Rescue medications: AirSupra  2 puffs every 4-6 hours as needed - Asthma control goals:  * Full participation in all desired activities (may need albuterol  before activity) * Albuterol  use two time or less a week on average (not counting use with activity) * Cough interfering with sleep two time or less a month * Oral steroids no more than once a year * No hospitalizations  2. Perennial and seasonal allergic rhinitis (grasses, trees, indoor molds, dust mite, cat) - I am glad that the allergy  shots are going so well. - Try stopping the Singulair  (montelukast ) and go for one month.  - Continue with: Xyzal  (levocetirizine) 5mg  tablet once daily and Allegra  (fexofenadine ) 180mg  once daily - Continue with: Ryaltris  (olopatadine/mometasone) two sprays per nostril 1-2 times daily as needed - The nose spray contains a nasal steroid AND a nasal antihistamine.  - You can use an extra dose of the antihistamine, if needed, for breakthrough symptoms.  - Consider nasal saline rinses 1-2 times daily to remove allergens from the nasal cavities as well as help with mucous clearance (this is especially helpful to do before the nasal sprays are given)  3. Recurrent infections - You had an excellent response to the Pneumovax.    4. GERD - Continue with omeprazole  40mg  once daily.  5. Return in about 6 months (around 01/05/2024). You can have the follow up appointment with Dr. Idolina Maker or a Nurse Practicioner (our Nurse Practitioners are excellent and always have Physician oversight!).    Please inform us  of any Emergency Department visits, hospitalizations, or changes in symptoms. Call us  before going to the ED for  breathing or allergy  symptoms since we might be able to fit you in for a sick visit. Feel free to contact us  anytime with any questions, problems, or concerns.  It was a pleasure to see you again today!  Websites that have reliable patient information: 1. American Academy of Asthma, Allergy , and Immunology: www.aaaai.org 2. Food Allergy  Research and Education (FARE): foodallergy.org 3. Mothers of Asthmatics: http://www.asthmacommunitynetwork.org 4. American College of Allergy , Asthma, and Immunology: www.acaai.org      "Like" us  on Facebook and Instagram for our latest updates!      A healthy democracy works best when [employer] participate! Make sure you are registered to vote! If you have moved or changed any of your contact information, you will need to get this updated before voting! Scan the QR codes below to learn more!

## 2023-07-07 ENCOUNTER — Telehealth: Payer: Self-pay

## 2023-07-07 NOTE — Telephone Encounter (Signed)
*  AA  Pharmacy Patient Advocate Encounter   Received notification from CoverMyMeds that prior authorization for Tacrolimus  0.1% ointment  is required/requested.   Insurance verification completed.   The patient is insured through [employer] [place]  .   Per test claim: PA required; PA submitted to above mentioned insurance via CoverMyMeds Key/confirmation #/EOC [id number] Status is pending

## 2023-07-07 NOTE — Telephone Encounter (Signed)
*  AA  Pharmacy Patient Advocate Encounter   Received notification from CoverMyMeds that prior authorization for Airsupra  90-80MCG/ACT aerosol  is required/requested.   Insurance verification completed.   The patient is insured through [employer] [place]  .   Per test claim: PA required; PA submitted to above mentioned insurance via CoverMyMeds Key/confirmation #/EOC [id number] Status is pending

## 2023-07-07 NOTE — Telephone Encounter (Signed)
*  AA  Pharmacy Patient Advocate Encounter  Received notification from [employer] [place]  that Prior Authorization for Levocetirizine Dihydrochloride  5MG  tablets  has been CANCELLED due to Plan/Benefit Exclusion   OTC not covered

## 2023-07-07 NOTE — Telephone Encounter (Signed)
Your request was approved based on the initial information provided at the time of the coverage request submission. Please allow additional time for the final decision to be made and added to the patient's account.

## 2023-07-10 ENCOUNTER — Encounter: Payer: Self-pay | Admitting: Allergy & Immunology

## 2023-07-10 NOTE — Telephone Encounter (Signed)
 The case has been Approved from [id number]

## 2023-07-18 ENCOUNTER — Ambulatory Visit (INDEPENDENT_AMBULATORY_CARE_PROVIDER_SITE_OTHER): Admitting: Podiatry

## 2023-07-18 DIAGNOSIS — M25571 Pain in right ankle and joints of right foot: Secondary | ICD-10-CM

## 2023-07-18 NOTE — Progress Notes (Signed)
 Patient presents follow-up injection subtalar joint right.  Doing well with minimal discomfort at this point.   Physical exam:  General appearance: Pleasant, and in no acute distress. AOx3.  Vascular: Pedal pulses: DP 2/4 bilaterally, PT 2/4 bilaterally.  Minimal edema lower legs bilaterally. Capillary fill time immediate.  Neurological: Light touch intact feet bilaterally.  Normal Achilles reflex bilaterally.    Dermatologic:   Skin normal temperature bilaterally.  Skin normal color, tone, and texture bilaterally.   Musculoskeletal: Slight tenderness to palpation sinus tarsi right.  No tenderness with range of motion subtalar joint right.  No tenderness along the ankle.    Diagnosis: 1.  Arthralgia subtalar joint right-improved  Plan: -Established office visit for evaluation and management level 3. - Standing good supportive shoe. - Ice 20 minutes an hour and/or apply Voltaren gel 3-4 times a day as needed. -OTC NSAIDs as needed   Return as needed

## 2023-07-20 DIAGNOSIS — D492 Neoplasm of unspecified behavior of bone, soft tissue, and skin: Secondary | ICD-10-CM | POA: Diagnosis not present

## 2023-07-20 DIAGNOSIS — R208 Other disturbances of skin sensation: Secondary | ICD-10-CM | POA: Diagnosis not present

## 2023-07-20 DIAGNOSIS — L720 Epidermal cyst: Secondary | ICD-10-CM | POA: Diagnosis not present

## 2023-07-25 ENCOUNTER — Ambulatory Visit (INDEPENDENT_AMBULATORY_CARE_PROVIDER_SITE_OTHER): Payer: Self-pay | Admitting: Family

## 2023-07-25 VITALS — BP 112/80 | HR 81 | Temp 98.0°F | Resp 16 | Ht 62.0 in | Wt 169.0 lb

## 2023-07-25 DIAGNOSIS — Z5320 Procedure and treatment not carried out because of patient's decision for unspecified reasons: Secondary | ICD-10-CM | POA: Diagnosis not present

## 2023-07-25 DIAGNOSIS — Z1211 Encounter for screening for malignant neoplasm of colon: Secondary | ICD-10-CM

## 2023-07-25 DIAGNOSIS — Z13220 Encounter for screening for lipoid disorders: Secondary | ICD-10-CM | POA: Diagnosis not present

## 2023-07-25 DIAGNOSIS — Z13228 Encounter for screening for other metabolic disorders: Secondary | ICD-10-CM

## 2023-07-25 DIAGNOSIS — R635 Abnormal weight gain: Secondary | ICD-10-CM | POA: Diagnosis not present

## 2023-07-25 DIAGNOSIS — Z130 Encounter for screening for diseases of the blood and blood-forming organs and certain disorders involving the immune mechanism: Secondary | ICD-10-CM | POA: Diagnosis not present

## 2023-07-25 DIAGNOSIS — Z0000 Encounter for general adult medical examination without abnormal findings: Secondary | ICD-10-CM

## 2023-07-25 DIAGNOSIS — Z7689 Persons encountering health services in other specified circumstances: Secondary | ICD-10-CM

## 2023-07-25 DIAGNOSIS — Z131 Encounter for screening for diabetes mellitus: Secondary | ICD-10-CM

## 2023-07-25 DIAGNOSIS — Z6830 Body mass index (BMI) 30.0-30.9, adult: Secondary | ICD-10-CM | POA: Diagnosis not present

## 2023-07-25 DIAGNOSIS — Z1329 Encounter for screening for other suspected endocrine disorder: Secondary | ICD-10-CM

## 2023-07-25 MED ORDER — PHENTERMINE HCL 30 MG PO CAPS
30.0000 mg | ORAL_CAPSULE | ORAL | 0 refills | Status: DC
Start: 2023-07-25 — End: 2023-08-31

## 2023-07-25 NOTE — Progress Notes (Signed)
 Patient ID: Tracy [name], female    DOB: 09/18/1975  MRN: [id number]  CC: Annual Exam  Subjective: Tracy [name] is a 48 y.o. female who presents for annual exam.  Her concerns today include:  - Up to date on mammogram. - Reports up to date on cervical cancer screening. - Doing well on Phentermine , no issues/concerns.  Patient Active Problem List   Diagnosis Date Noted   Multiple contusions 01/30/2023   Musculoskeletal back pain 01/30/2023   Moderate persistent asthma, uncomplicated 12/02/2021   Perennial allergic rhinitis 12/02/2021   Recurrent infections 12/02/2021   Flexural atopic dermatitis 12/02/2021   Gastroesophageal reflux disease 12/02/2021   Essential (primary) hypertension 12/09/2016     Current Outpatient Medications on File Prior to Visit  Medication Sig Dispense Refill   acetaminophen  (TYLENOL ) 500 MG tablet Take 500 mg by mouth every 6 (six) hours as needed.     Albuterol -Budesonide (AIRSUPRA ) 90-80 MCG/ACT AERO Inhale 2 puffs into the lungs every 4 (four) hours as needed. 10.7 g 3   amLODipine  (NORVASC ) 10 MG tablet Take 1 tablet (10 mg total) by mouth daily. 90 tablet 0   betamethasone  valerate ointment (VALISONE ) 0.1 % Apply 1 Application topically 2 (two) times daily. 30 g 5   cyclobenzaprine  (FLEXERIL ) 5 MG tablet Take 1 tablet (5 mg total) by mouth at bedtime as needed for muscle spasms. 30 tablet 0   EPINEPHrine  0.3 mg/0.3 mL IJ SOAJ injection Inject 0.3 mg into the muscle as needed for anaphylaxis. 2 each 1   fexofenadine  (ALLEGRA  ALLERGY ) 180 MG tablet Take 1 tablet (180 mg total) by mouth daily. 90 tablet 3   fluticasone  (FLONASE ) 50 MCG/ACT nasal spray Place 1 spray into both nostrils daily. 47.4 mL 1   fluticasone -salmeterol (ADVAIR DISKUS) 250-50 MCG/ACT AEPB Inhale 1 puff into the lungs in the morning and at bedtime. 60 each 5   levocetirizine (XYZAL ) 5 MG tablet Take 1 tablet (5 mg total) by mouth daily as needed for allergies (Can take an extra  dose during flare ups.). 180 tablet 1   lisinopril -hydrochlorothiazide  (ZESTORETIC ) 20-12.5 MG tablet Take 1 tablet by mouth daily. 90 tablet 0   meloxicam  (MOBIC ) 15 MG tablet Take 1 tablet (15 mg total) by mouth daily. 30 tablet 2   montelukast  (SINGULAIR ) 10 MG tablet TAKE 1 TABLET BY MOUTH EVERYDAY AT BEDTIME 90 tablet 1   Multiple Vitamin (MULTIVITAMIN WITH MINERALS) TABS tablet Take 1 tablet by mouth daily.     omeprazole  (PRILOSEC) 40 MG capsule Take 1 capsule (40 mg total) by mouth every morning. 90 capsule 1   Ruxolitinib Phosphate  (OPZELURA ) 1.5 % CREA Apply 1 Application topically 2 (two) times daily as needed. 60 g 1   RYALTRIS  665-25 MCG/ACT SUSP 2 sprays per nostril 1-2 times daily as needed. 29 g 5   tacrolimus  (PROTOPIC ) 0.1 % ointment Apply topically 2 (two) times daily. 300 g 1   No current facility-administered medications on file prior to visit.    Allergies  Allergen Reactions   Cefdinir Itching   Latex Itching, Swelling and Rash   Penicillins Hives, Swelling and Rash    Social History   Socioeconomic History   Marital status: Divorced    Spouse name: Not on file   Number of children: Not on file   Years of education: Not on file   Highest education level: Not on file  Occupational History   Not on file  Tobacco Use   Smoking status: Never  Smokeless tobacco: Never  Vaping Use   Vaping status: Never Used  Substance and Sexual Activity   Alcohol use: Yes    Comment: occ   Drug use: No   Sexual activity: Yes    Birth control/protection: None  Other Topics Concern   Not on file  Social History Narrative   Not on file   Social Drivers of Health   Financial Resource Strain: Low Risk  (06/29/2023)   Overall Financial Resource Strain (CARDIA)    Difficulty of Paying Living Expenses: Not hard at all  Food Insecurity: No Food Insecurity (06/29/2023)   Hunger Vital Sign    Worried About Running Out of Food in the Last Year: Never true    Ran Out of Food  in the Last Year: Never true  Transportation Needs: No Transportation Needs (06/29/2023)   PRAPARE - Administrator, Civil Service (Medical): No    Lack of Transportation (Non-Medical): No  Physical Activity: Inactive (06/29/2023)   Exercise Vital Sign    Days of Exercise per Week: 0 days    Minutes of Exercise per Session: 0 min  Stress: No Stress Concern Present (06/29/2023)   [employer] of Occupational Health - Occupational Stress Questionnaire    Feeling of Stress: Not at all  Social Connections: Moderately Isolated (06/29/2023)   Social Connection and Isolation Panel    Frequency of Communication with Friends and Family: More than three times a week    Frequency of Social Gatherings with Friends and Family: More than three times a week    Attends Religious Services: 1 to 4 times per year    Active Member of [employer] or Organizations: No    Attends Banker Meetings: Never    Marital Status: Divorced  Catering manager Violence: Not At Risk (06/29/2023)   Humiliation, Afraid, Rape, and Kick questionnaire    Fear of Current or Ex-Partner: No    Emotionally Abused: No    Physically Abused: No    Sexually Abused: No    Family History  Problem Relation Age of Onset   Eczema Mother    Asthma Mother    Allergic rhinitis Mother    Hypertension Mother    Diabetes Mother    Eczema Father    Hypertension Father    Diabetes Father    Asthma Brother    Allergic rhinitis Brother    Allergic rhinitis Maternal Grandmother    Asthma Paternal Grandmother    Sleep apnea Neg Hx     Past Surgical History:  Procedure Laterality Date   BREAST LUMPECTOMY WITH RADIOACTIVE SEED LOCALIZATION Left 07/30/2020   Procedure: LEFT BREAST LUMPECTOMY WITH RADIOACTIVE SEED LOCALIZATION;  Surgeon: Vanderbilt Ned, MD;  Location: [facility];  Service: General;  Laterality: Left;   CESAREAN SECTION  1994   CESAREAN SECTION  2001   CESAREAN SECTION  06/19/2008    TONSILLECTOMY  1992   UTERINE FIBROID SURGERY  10/2010    ROS: Review of Systems Negative except as stated above  PHYSICAL EXAM: BP 112/80   Pulse 81   Temp 98 F (36.7 C) (Oral)   Resp 16   Ht 5' 2 (1.575 m)   Wt 169 lb (76.7 kg)   LMP  (Exact Date)   SpO2 97%   BMI 30.91 kg/m   Wt Readings from Last 3 Encounters:  07/25/23 169 lb (76.7 kg)  07/06/23 172 lb 4.8 oz (78.2 kg)  06/29/23 173 lb (78.5 kg)  Physical Exam HENT:     Head: Normocephalic and atraumatic.     Right Ear: Tympanic membrane, ear canal and external ear normal.     Left Ear: Tympanic membrane, ear canal and external ear normal.     Nose: Nose normal.     Mouth/Throat:     Mouth: Mucous membranes are moist.     Pharynx: Oropharynx is clear.  Eyes:     Extraocular Movements: Extraocular movements intact.     Conjunctiva/sclera: Conjunctivae normal.     Pupils: Pupils are equal, round, and reactive to light.  Neck:     Thyroid: No thyroid mass, thyromegaly or thyroid tenderness.  Cardiovascular:     Rate and Rhythm: Normal rate and regular rhythm.     Pulses: Normal pulses.     Heart sounds: Normal heart sounds.  Pulmonary:     Effort: Pulmonary effort is normal.     Breath sounds: Normal breath sounds.  Chest:     Comments: Patient declined. Abdominal:     General: Bowel sounds are normal.     Palpations: Abdomen is soft.  Genitourinary:    Comments: Patient declined. Musculoskeletal:        General: Normal range of motion.     Right shoulder: Normal.     Left shoulder: Normal.     Right upper arm: Normal.     Left upper arm: Normal.     Right elbow: Normal.     Left elbow: Normal.     Right forearm: Normal.     Left forearm: Normal.     Right wrist: Normal.     Left wrist: Normal.     Right hand: Normal.     Left hand: Normal.     Cervical back: Normal, normal range of motion and neck supple.     Thoracic back: Normal.     Lumbar back: Normal.     Right hip: Normal.     Left  hip: Normal.     Right upper leg: Normal.     Left upper leg: Normal.     Right knee: Normal.     Left knee: Normal.     Right lower leg: Normal.     Left lower leg: Normal.     Right ankle: Normal.     Left ankle: Normal.     Right foot: Normal.     Left foot: Normal.  Skin:    General: Skin is warm and dry.     Capillary Refill: Capillary refill takes less than 2 seconds.  Neurological:     General: No focal deficit present.     Mental Status: She is alert and oriented to person, place, and time.  Psychiatric:        Mood and Affect: Mood normal.        Behavior: Behavior normal.     ASSESSMENT AND PLAN: 1. Annual physical exam (Primary) - Counseled on 150 minutes of exercise per week as tolerated, healthy eating (including decreased daily intake of saturated fats, cholesterol, added sugars, sodium), STI prevention, and routine healthcare maintenance.  2. Screening for metabolic disorder - Routine screening.  - CMP14+EGFR  3. Screening for deficiency anemia - Routine screening.  - CBC  4. Diabetes mellitus screening - Routine screening.  - Hemoglobin A1c  5. Screening cholesterol level - Routine screening.  - Lipid panel  6. Thyroid disorder screen - Routine screening.  - TSH  7. Cervical cancer screening declined - Patient declined.  8. Colon cancer screening - Referral to Gastroenterology for colon cancer screening by colonoscopy.  - Ambulatory referral to Gastroenterology  9. Encounter for weight management 10. BMI 30.0-30.9,adult 11. Weight gain - Patient lost 3 pounds since previous office visit.  - Increase Phentermine  from 15 mg to 30 mg as prescribed. Counseled on medication adherence/adverse effects. - Follow-up with primary provider in 4 weeks or sooner if needed. - phentermine  30 MG capsule; Take 1 capsule (30 mg total) by mouth every morning.  Dispense: 30 capsule; Refill: 0   Patient was given the opportunity to ask questions.  Patient  verbalized understanding of the plan and was able to repeat key elements of the plan. Patient was given clear instructions to go to Emergency Department or return to medical center if symptoms don't improve, worsen, or new problems develop.The patient verbalized understanding.   Orders Placed This Encounter  Procedures   CBC   Lipid panel   CMP14+EGFR   Hemoglobin A1c   TSH   Ambulatory referral to Gastroenterology     Requested Prescriptions   Signed Prescriptions Disp Refills   phentermine  30 MG capsule 30 capsule 0    Sig: Take 1 capsule (30 mg total) by mouth every morning.    Return in about 1 year (around 07/24/2024) for Physical per patient preference.  Greig JINNY Drones, NP

## 2023-07-26 ENCOUNTER — Ambulatory Visit: Payer: Self-pay | Admitting: Family

## 2023-07-26 DIAGNOSIS — Z13220 Encounter for screening for lipoid disorders: Secondary | ICD-10-CM

## 2023-07-26 LAB — LIPID PANEL
Chol/HDL Ratio: 3.5 ratio (ref 0.0–4.4)
Cholesterol, Total: 247 mg/dL — ABNORMAL HIGH (ref 100–199)
HDL: 71 mg/dL (ref >39)
LDL Chol Calc (NIH): 148 mg/dL — ABNORMAL HIGH (ref 0–99)
Triglycerides: 159 mg/dL — ABNORMAL HIGH (ref 0–149)
VLDL Cholesterol Cal: 28 mg/dL (ref 5–40)

## 2023-07-26 LAB — CMP14+EGFR
ALT: 25 IU/L (ref 0–32)
AST: 19 IU/L (ref 0–40)
Albumin: 4.2 g/dL (ref 3.9–4.9)
Alkaline Phosphatase: 70 IU/L (ref 44–121)
BUN/Creatinine Ratio: 8 — ABNORMAL LOW (ref 9–23)
BUN: 7 mg/dL (ref 6–24)
Bilirubin Total: 0.3 mg/dL (ref 0.0–1.2)
CO2: 23 mmol/L (ref 20–29)
Calcium: 9.8 mg/dL (ref 8.7–10.2)
Chloride: 102 mmol/L (ref 96–106)
Creatinine, Ser: 0.87 mg/dL (ref 0.57–1.00)
Globulin, Total: 2.4 g/dL (ref 1.5–4.5)
Glucose: 105 mg/dL — ABNORMAL HIGH (ref 70–99)
Potassium: 3.7 mmol/L (ref 3.5–5.2)
Sodium: 140 mmol/L (ref 134–144)
Total Protein: 6.6 g/dL (ref 6.0–8.5)
eGFR: 82 mL/min/1.73 (ref >59)

## 2023-07-26 LAB — HEMOGLOBIN A1C
Est. average glucose Bld gHb Est-mCnc: 126 mg/dL
Hgb A1c MFr Bld: 6 % — ABNORMAL HIGH (ref 4.8–5.6)

## 2023-07-26 LAB — CBC
Hematocrit: 43.5 % (ref 34.0–46.6)
Hemoglobin: 13.8 g/dL (ref 11.1–15.9)
MCH: 28.8 pg (ref 26.6–33.0)
MCHC: 31.7 g/dL (ref 31.5–35.7)
MCV: 91 fL (ref 79–97)
Platelets: 306 x10E3/uL (ref 150–450)
RBC: 4.8 x10E6/uL (ref 3.77–5.28)
RDW: 13.1 % (ref 11.7–15.4)
WBC: 7.3 x10E3/uL (ref 3.4–10.8)

## 2023-07-26 LAB — TSH: TSH: 1.12 u[IU]/mL (ref 0.450–4.500)

## 2023-07-31 ENCOUNTER — Ambulatory Visit: Admitting: Family

## 2023-08-02 ENCOUNTER — Ambulatory Visit (INDEPENDENT_AMBULATORY_CARE_PROVIDER_SITE_OTHER)

## 2023-08-02 DIAGNOSIS — J309 Allergic rhinitis, unspecified: Secondary | ICD-10-CM

## 2023-08-21 ENCOUNTER — Encounter: Admitting: Family

## 2023-08-22 DIAGNOSIS — M5442 Lumbago with sciatica, left side: Secondary | ICD-10-CM | POA: Diagnosis not present

## 2023-08-22 DIAGNOSIS — M5416 Radiculopathy, lumbar region: Secondary | ICD-10-CM | POA: Diagnosis not present

## 2023-08-31 ENCOUNTER — Encounter: Payer: Self-pay | Admitting: Family

## 2023-08-31 ENCOUNTER — Other Ambulatory Visit: Payer: Self-pay | Admitting: Family

## 2023-08-31 ENCOUNTER — Ambulatory Visit (INDEPENDENT_AMBULATORY_CARE_PROVIDER_SITE_OTHER): Admitting: Family

## 2023-08-31 VITALS — BP 119/86 | HR 98 | Temp 98.4°F | Resp 16 | Ht 62.0 in | Wt 172.4 lb

## 2023-08-31 DIAGNOSIS — R635 Abnormal weight gain: Secondary | ICD-10-CM

## 2023-08-31 DIAGNOSIS — Z6831 Body mass index (BMI) 31.0-31.9, adult: Secondary | ICD-10-CM | POA: Diagnosis not present

## 2023-08-31 DIAGNOSIS — Z7689 Persons encountering health services in other specified circumstances: Secondary | ICD-10-CM

## 2023-08-31 DIAGNOSIS — Z13220 Encounter for screening for lipoid disorders: Secondary | ICD-10-CM | POA: Diagnosis not present

## 2023-08-31 MED ORDER — PHENTERMINE HCL 37.5 MG PO CAPS: 37.5000 mg | ORAL_CAPSULE | ORAL | 0 refills | Status: DC

## 2023-08-31 NOTE — Telephone Encounter (Unsigned)
 Copied from CRM #[id number]. Topic: Clinical - Medication Refill >> Aug 31, 2023  1:02 PM Turkey B wrote: Medication: phentermine  37.5 MG capsule  Has the patient contacted their pharmacy? No , dosage was increased by Dr Lorren today And was [facility] of [facility] order   This is the patient's preferred pharmacy:    [email or web address] - [place], [place] - [address] [place] [address] Phone: [phone] Fax: [phone]  Is this the correct pharmacy for this prescription? yes  Has the [facility] been filled recently? [facility]  Is the patient out of the medication? yes  Has the patient been seen for an appointment in the last year OR does the patient have an upcoming appointment? yes  Can we respond through MyChart? yes  Agent: Please be advised that Rx refills may take up to 3 business days. We ask that you follow-up with your pharmacy.

## 2023-08-31 NOTE — Progress Notes (Signed)
 Patient ID: Tracy [name], female    DOB: [birth year]  MRN: [id number]  CC: Weight Check  Subjective: Vianny Schraeder is a 48 y.o. female who presents for weight check.   Her concerns today include:  - Doing well on Phentermine , no issues/concerns. She is watching what she eats. She exercises. States thinks she may have some bloating on today.  - Cholesterol lab.  Patient Active Problem List   Diagnosis Date Noted   Multiple contusions 01/30/2023   Musculoskeletal back pain 01/30/2023   Moderate persistent asthma, uncomplicated 12/02/2021   Perennial allergic rhinitis 12/02/2021   Recurrent infections 12/02/2021   Flexural atopic dermatitis 12/02/2021   Gastroesophageal reflux disease 12/02/2021   Essential (primary) hypertension 12/09/2016     Current Outpatient Medications on File Prior to Visit  Medication Sig Dispense Refill   acetaminophen  (TYLENOL ) 500 MG tablet Take 500 mg by mouth every 6 (six) hours as needed.     Albuterol -Budesonide (AIRSUPRA ) 90-80 MCG/ACT AERO Inhale 2 puffs into the lungs every 4 (four) hours as needed. 10.7 g 3   amLODipine  (NORVASC ) 10 MG tablet Take 1 tablet (10 mg total) by mouth daily. 90 tablet 0   betamethasone  valerate ointment (VALISONE ) 0.1 % Apply 1 Application topically 2 (two) times daily. 30 g 5   cyclobenzaprine  (FLEXERIL ) 5 MG tablet Take 1 tablet (5 mg total) by mouth at bedtime as needed for muscle spasms. 30 tablet 0   EPINEPHrine  0.3 mg/0.3 mL IJ SOAJ injection Inject 0.3 mg into the muscle as needed for anaphylaxis. 2 each 1   fexofenadine  (ALLEGRA  ALLERGY ) 180 MG tablet Take 1 tablet (180 mg total) by mouth daily. 90 tablet 3   fluticasone  (FLONASE ) 50 MCG/ACT nasal spray Place 1 spray into both nostrils daily. 47.4 mL 1   fluticasone -salmeterol (ADVAIR DISKUS) 250-50 MCG/ACT AEPB Inhale 1 puff into the lungs in the morning and at bedtime. 60 each 5   levocetirizine (XYZAL ) 5 MG tablet Take 1 tablet (5 mg total) by mouth daily as  needed for allergies (Can take an extra dose during flare ups.). 180 tablet 1   lisinopril -hydrochlorothiazide  (ZESTORETIC ) 20-12.5 MG tablet Take 1 tablet by mouth daily. 90 tablet 0   meloxicam  (MOBIC ) 15 MG tablet Take 1 tablet (15 mg total) by mouth daily. 30 tablet 2   montelukast  (SINGULAIR ) 10 MG tablet TAKE 1 TABLET BY MOUTH EVERYDAY AT BEDTIME 90 tablet 1   Multiple Vitamin (MULTIVITAMIN WITH MINERALS) TABS tablet Take 1 tablet by mouth daily.     omeprazole  (PRILOSEC) 40 MG capsule Take 1 capsule (40 mg total) by mouth every morning. 90 capsule 1   Ruxolitinib Phosphate  (OPZELURA ) 1.5 % CREA Apply 1 Application topically 2 (two) times daily as needed. 60 g 1   RYALTRIS  665-25 MCG/ACT SUSP 2 sprays per nostril 1-2 times daily as needed. 29 g 5   tacrolimus  (PROTOPIC ) 0.1 % ointment Apply topically 2 (two) times daily. 300 g 1   No current facility-administered medications on file prior to visit.    Allergies  Allergen Reactions   Cefdinir Itching   Latex Itching, Swelling and Rash   Penicillins Hives, Swelling and Rash    Social History   Socioeconomic History   Marital status: Divorced    Spouse name: Not on file   Number of children: Not on file   Years of education: Not on file   Highest education level: Not on file  Occupational History   Not on file  Tobacco  Use   Smoking status: Never   Smokeless tobacco: Never  Vaping Use   Vaping status: Never Used  Substance and Sexual Activity   Alcohol use: Yes    Comment: occ   Drug use: No   Sexual activity: Yes    Birth control/protection: None  Other Topics Concern   Not on file  Social History Narrative   Not on file   Social Drivers of Health   Financial Resource Strain: Low Risk  (06/29/2023)   Overall Financial Resource Strain (CARDIA)    Difficulty of Paying Living Expenses: Not hard at all  Food Insecurity: No Food Insecurity (06/29/2023)   Hunger Vital Sign    Worried About Running Out of Food in the  Last Year: Never true    Ran Out of Food in the Last Year: Never true  Transportation Needs: No Transportation Needs (06/29/2023)   PRAPARE - Administrator, Civil Service (Medical): No    Lack of Transportation (Non-Medical): No  Physical Activity: Inactive (06/29/2023)   Exercise Vital Sign    Days of Exercise per Week: 0 days    Minutes of Exercise per Session: 0 min  Stress: No Stress Concern Present (06/29/2023)   [employer] of Occupational Health - Occupational Stress Questionnaire    Feeling of Stress: Not at all  Social Connections: Moderately Isolated (06/29/2023)   Social Connection and Isolation Panel    Frequency of Communication with Friends and Family: More than three times a week    Frequency of Social Gatherings with Friends and Family: More than three times a week    Attends Religious Services: 1 to 4 times per year    Active Member of [employer] or Organizations: No    Attends Banker Meetings: Never    Marital Status: Divorced  Catering manager Violence: Not At Risk (06/29/2023)   Humiliation, Afraid, Rape, and Kick questionnaire    Fear of Current or Ex-Partner: No    Emotionally Abused: No    Physically Abused: No    Sexually Abused: No    Family History  Problem Relation Age of Onset   Eczema Mother    Asthma Mother    Allergic rhinitis Mother    Hypertension Mother    Diabetes Mother    Eczema Father    Hypertension Father    Diabetes Father    Asthma Brother    Allergic rhinitis Brother    Allergic rhinitis Maternal Grandmother    Asthma Paternal Grandmother    Sleep apnea Neg Hx     Past Surgical History:  Procedure Laterality Date   BREAST LUMPECTOMY WITH RADIOACTIVE SEED LOCALIZATION Left 07/30/2020   Procedure: LEFT BREAST LUMPECTOMY WITH RADIOACTIVE SEED LOCALIZATION;  Surgeon: Vanderbilt Ned, MD;  Location: [facility];  Service: General;  Laterality: Left;   CESAREAN SECTION  1994   CESAREAN SECTION   2001   CESAREAN SECTION  06/19/2008   TONSILLECTOMY  1992   UTERINE FIBROID SURGERY  10/2010    ROS: Review of Systems Negative except as stated above  PHYSICAL EXAM: BP 119/86   Pulse 98   Temp 98.4 F (36.9 C) (Oral)   Resp 16   Ht 5' 2 (1.575 m)   Wt 172 lb 6.4 oz (78.2 kg)   SpO2 98%   BMI 31.53 kg/m   Wt Readings from Last 3 Encounters:  08/31/23 172 lb 6.4 oz (78.2 kg)  07/25/23 169 lb (76.7 kg)  07/06/23 172 lb  4.8 oz (78.2 kg)   Physical Exam HENT:     Head: Normocephalic and atraumatic.     Nose: Nose normal.     Mouth/Throat:     Mouth: Mucous membranes are moist.     Pharynx: Oropharynx is clear.  Eyes:     Extraocular Movements: Extraocular movements intact.     Conjunctiva/sclera: Conjunctivae normal.     Pupils: Pupils are equal, round, and reactive to light.  Cardiovascular:     Rate and Rhythm: Normal rate and regular rhythm.     Pulses: Normal pulses.     Heart sounds: Normal heart sounds.  Pulmonary:     Effort: Pulmonary effort is normal.     Breath sounds: Normal breath sounds.  Musculoskeletal:        General: Normal range of motion.     Cervical back: Normal range of motion and neck supple.  Neurological:     General: No focal deficit present.     Mental Status: She is alert and oriented to person, place, and time.  Psychiatric:        Mood and Affect: Mood normal.        Behavior: Behavior normal.     ASSESSMENT AND PLAN: 1. Encounter for weight management (Primary) 2. BMI 31.0-31.9,adult 3. Weight gain - Patient gained 3 pounds since previous office visit.  - Increase Phentermine  from 30 mg to 37.5 mg as prescribed. Counseled on medication adherence/adverse effects.  - Follow-up with primary provider in 4 weeks or sooner if needed. - phentermine  37.5 MG capsule; Take 1 capsule (37.5 mg total) by mouth every morning.  Dispense: 30 capsule; Refill: 0  4. Screening cholesterol level - Routine screening.  - Lipid  panel   Patient was given the opportunity to ask questions.  Patient verbalized understanding of the plan and was able to repeat key elements of the plan. Patient was given clear instructions to go to Emergency Department or return to medical center if symptoms don't improve, worsen, or new problems develop.The patient verbalized understanding.   Orders Placed This Encounter  Procedures   Lipid panel     Requested Prescriptions   Signed Prescriptions Disp Refills   phentermine  37.5 MG capsule 30 capsule 0    Sig: Take 1 capsule (37.5 mg total) by mouth every morning.    Return in about 4 weeks (around 09/28/2023) for Follow-Up or next available weight check.  Greig JINNY Drones, NP

## 2023-08-31 NOTE — Telephone Encounter (Signed)
 FYI Only or Action Required?: Action required by provider: medication refill request.  Patient was last seen in primary care on 08/31/2023 by Lorren Greig PARAS, NP.  Called Nurse Triage reporting Medication Refill.  Symptoms began today.  Interventions attempted: Nothing.  Symptoms are: stable.  Triage Disposition: No disposition on file.  Patient/caregiver understands and will follow disposition?:

## 2023-09-01 LAB — LIPID PANEL
Chol/HDL Ratio: 3.3 ratio (ref 0.0–4.4)
Cholesterol, Total: 249 mg/dL — ABNORMAL HIGH (ref 100–199)
HDL: 75 mg/dL (ref >39)
LDL Chol Calc (NIH): 147 mg/dL — ABNORMAL HIGH (ref 0–99)
Triglycerides: 153 mg/dL — ABNORMAL HIGH (ref 0–149)
VLDL Cholesterol Cal: 27 mg/dL (ref 5–40)

## 2023-09-04 NOTE — Telephone Encounter (Signed)
 Requested medication (s) are due for refill today - no  Requested medication (s) are on the active medication list -yes  Future visit scheduled -no  Last refill: -08/31/23 #30  Notes to clinic: non delegated Rx  Requested Prescriptions  Pending Prescriptions Disp Refills   phentermine  37.5 MG capsule 30 capsule 0    Sig: Take 1 capsule (37.5 mg total) by mouth every morning.     Not Delegated - Neurology: Anticonvulsants - Controlled - phentermine  hydrochloride Failed - 09/04/2023  2:04 PM      Failed - This refill cannot be delegated      Passed - eGFR in normal range and within 360 days    GFR calc Af Amer  Date Value Ref Range Status  10/21/2019 90 >59 mL/min/1.73 Final    Comment:    **[facility] currently reports eGFR in compliance with the current**   recommendations of the [employer]. [facility] will   update reporting as new guidelines are published from the NKF-ASN   Task force.    GFR, Estimated  Date Value Ref Range Status  07/28/2020 >60 >60 mL/min Final    Comment:    (NOTE) Calculated using the CKD-EPI Creatinine Equation (2021)    GFR  Date Value Ref Range Status  10/27/2021 95.88 >60.00 mL/min Final    Comment:    Calculated using the CKD-EPI Creatinine Equation (2021)   eGFR  Date Value Ref Range Status  07/25/2023 82 >59 mL/min/1.73 Final         Passed - Cr in normal range and within 360 days    Creatinine, Ser  Date Value Ref Range Status  07/25/2023 0.87 0.57 - 1.00 mg/dL Final         Passed - Last BP in normal range    BP Readings from Last 1 Encounters:  08/31/23 119/86         Passed - Valid encounter within last 6 months    Recent Outpatient Visits           4 days ago Encounter for weight management   [facility], [place], NP   1 month ago Annual physical exam   [place] [facility] at [facility], Amy J, NP   2 months ago Encounter to establish care   [facility], Amy J, NP   3 years ago Encounter for general adult medical examination with abnormal findings   Primary Care at [facility], [place], MD   3 years ago Screening cholesterol level   Primary Care at [place], [place], MD       Future Appointments             In 4 months Iva, Marty Saltness, MD [facility] at [facility] - Weight completed in the last 3 months    Wt Readings from Last 1 Encounters:  08/31/23 172 lb 6.4 oz (78.2 kg)            Requested Prescriptions  Pending Prescriptions Disp Refills   phentermine  37.5 MG capsule 30 capsule 0    Sig: Take 1 capsule (37.5 mg total) by mouth every morning.     Not Delegated - Neurology: Anticonvulsants - Controlled - phentermine  hydrochloride Failed - 09/04/2023  2:04 PM      Failed - This refill  cannot be delegated      Passed - eGFR in normal range and within 360 days    GFR calc Af Amer  Date Value Ref Range Status  10/21/2019 90 >59 mL/min/1.73 Final    Comment:    **[facility] currently reports eGFR in compliance with the current**   recommendations of the [employer]. [facility] will   update reporting as new guidelines are published from the NKF-ASN   Task force.    GFR, Estimated  Date Value Ref Range Status  07/28/2020 >60 >60 mL/min Final    Comment:    (NOTE) Calculated using the CKD-EPI Creatinine Equation (2021)    GFR  Date Value Ref Range Status  10/27/2021 95.88 >60.00 mL/min Final    Comment:    Calculated using the CKD-EPI Creatinine Equation (2021)   eGFR  Date Value Ref Range Status  07/25/2023 82 >59 mL/min/1.73 Final         Passed - Cr in normal range and within 360 days    Creatinine, Ser  Date Value Ref Range Status  07/25/2023 0.87 0.57 - 1.00 mg/dL Final         Passed - Last BP in normal range    BP Readings from Last 1 Encounters:  08/31/23  119/86         Passed - Valid encounter within last 6 months    Recent Outpatient Visits           4 days ago Encounter for weight management   [place] [facility], [place], NP   1 month ago Annual physical exam   [place] [facility] at [facility], Amy J, NP   2 months ago Encounter to establish care   [facility] at [facility], Amy J, NP   3 years ago Encounter for general adult medical examination with abnormal findings   Primary Care at [facility], [place], MD   3 years ago Screening cholesterol level   Primary Care at [place], Emil Schanz, MD       Future Appointments             In 4 months Iva Marty Saltness, MD [facility] at [facility] - Weight completed in the last 3 months    Wt Readings from Last 1 Encounters:  08/31/23 172 lb 6.4 oz (78.2 kg)

## 2023-09-05 ENCOUNTER — Other Ambulatory Visit: Payer: Self-pay | Admitting: Family

## 2023-09-05 ENCOUNTER — Ambulatory Visit: Payer: Self-pay | Admitting: Family

## 2023-09-05 DIAGNOSIS — E785 Hyperlipidemia, unspecified: Secondary | ICD-10-CM

## 2023-09-05 DIAGNOSIS — R635 Abnormal weight gain: Secondary | ICD-10-CM

## 2023-09-05 DIAGNOSIS — Z7689 Persons encountering health services in other specified circumstances: Secondary | ICD-10-CM

## 2023-09-05 DIAGNOSIS — Z6831 Body mass index (BMI) 31.0-31.9, adult: Secondary | ICD-10-CM

## 2023-09-05 MED ORDER — ATORVASTATIN CALCIUM 20 MG PO TABS: 20.0000 mg | ORAL_TABLET | Freq: Every day | ORAL | 0 refills | Status: DC

## 2023-09-05 MED ORDER — PHENTERMINE HCL 37.5 MG PO CAPS: 37.5000 mg | ORAL_CAPSULE | ORAL | 0 refills | Status: AC

## 2023-09-07 ENCOUNTER — Ambulatory Visit (INDEPENDENT_AMBULATORY_CARE_PROVIDER_SITE_OTHER)

## 2023-09-07 DIAGNOSIS — J309 Allergic rhinitis, unspecified: Secondary | ICD-10-CM | POA: Diagnosis not present

## 2023-09-08 ENCOUNTER — Other Ambulatory Visit: Payer: Self-pay | Admitting: Family

## 2023-09-08 DIAGNOSIS — I10 Essential (primary) hypertension: Secondary | ICD-10-CM

## 2023-09-20 ENCOUNTER — Other Ambulatory Visit: Payer: Self-pay | Admitting: Family

## 2023-09-20 DIAGNOSIS — I10 Essential (primary) hypertension: Secondary | ICD-10-CM

## 2023-10-12 DIAGNOSIS — M5416 Radiculopathy, lumbar region: Secondary | ICD-10-CM | POA: Diagnosis not present

## 2023-10-25 ENCOUNTER — Ambulatory Visit (INDEPENDENT_AMBULATORY_CARE_PROVIDER_SITE_OTHER)

## 2023-10-25 DIAGNOSIS — J309 Allergic rhinitis, unspecified: Secondary | ICD-10-CM | POA: Diagnosis not present

## 2023-11-01 ENCOUNTER — Ambulatory Visit (INDEPENDENT_AMBULATORY_CARE_PROVIDER_SITE_OTHER)

## 2023-11-01 DIAGNOSIS — J309 Allergic rhinitis, unspecified: Secondary | ICD-10-CM | POA: Diagnosis not present

## 2023-11-14 ENCOUNTER — Other Ambulatory Visit: Payer: Self-pay | Admitting: Family

## 2023-11-14 DIAGNOSIS — E785 Hyperlipidemia, unspecified: Secondary | ICD-10-CM

## 2023-11-14 DIAGNOSIS — I10 Essential (primary) hypertension: Secondary | ICD-10-CM

## 2023-11-23 DIAGNOSIS — J3089 Other allergic rhinitis: Secondary | ICD-10-CM | POA: Diagnosis not present

## 2023-11-23 DIAGNOSIS — J302 Other seasonal allergic rhinitis: Secondary | ICD-10-CM | POA: Diagnosis not present

## 2023-11-23 DIAGNOSIS — J3081 Allergic rhinitis due to animal (cat) (dog) hair and dander: Secondary | ICD-10-CM | POA: Diagnosis not present

## 2023-11-23 NOTE — Progress Notes (Signed)
 VIAL MADE ON 11/23/23

## 2023-11-30 ENCOUNTER — Ambulatory Visit (INDEPENDENT_AMBULATORY_CARE_PROVIDER_SITE_OTHER)

## 2023-11-30 DIAGNOSIS — J309 Allergic rhinitis, unspecified: Secondary | ICD-10-CM | POA: Diagnosis not present

## 2023-12-07 DIAGNOSIS — M5416 Radiculopathy, lumbar region: Secondary | ICD-10-CM | POA: Diagnosis not present

## 2023-12-12 DIAGNOSIS — M9905 Segmental and somatic dysfunction of pelvic region: Secondary | ICD-10-CM | POA: Diagnosis not present

## 2023-12-12 DIAGNOSIS — M9903 Segmental and somatic dysfunction of lumbar region: Secondary | ICD-10-CM | POA: Diagnosis not present

## 2023-12-12 DIAGNOSIS — M5417 Radiculopathy, lumbosacral region: Secondary | ICD-10-CM | POA: Diagnosis not present

## 2023-12-18 DIAGNOSIS — M5417 Radiculopathy, lumbosacral region: Secondary | ICD-10-CM | POA: Diagnosis not present

## 2023-12-18 DIAGNOSIS — M9905 Segmental and somatic dysfunction of pelvic region: Secondary | ICD-10-CM | POA: Diagnosis not present

## 2023-12-18 DIAGNOSIS — M9903 Segmental and somatic dysfunction of lumbar region: Secondary | ICD-10-CM | POA: Diagnosis not present

## 2023-12-20 DIAGNOSIS — M9903 Segmental and somatic dysfunction of lumbar region: Secondary | ICD-10-CM | POA: Diagnosis not present

## 2023-12-20 DIAGNOSIS — M9905 Segmental and somatic dysfunction of pelvic region: Secondary | ICD-10-CM | POA: Diagnosis not present

## 2023-12-20 DIAGNOSIS — M5417 Radiculopathy, lumbosacral region: Secondary | ICD-10-CM | POA: Diagnosis not present

## 2023-12-25 DIAGNOSIS — M9903 Segmental and somatic dysfunction of lumbar region: Secondary | ICD-10-CM | POA: Diagnosis not present

## 2023-12-25 DIAGNOSIS — M5417 Radiculopathy, lumbosacral region: Secondary | ICD-10-CM | POA: Diagnosis not present

## 2023-12-25 DIAGNOSIS — M9905 Segmental and somatic dysfunction of pelvic region: Secondary | ICD-10-CM | POA: Diagnosis not present

## 2023-12-27 DIAGNOSIS — M5417 Radiculopathy, lumbosacral region: Secondary | ICD-10-CM | POA: Diagnosis not present

## 2023-12-27 DIAGNOSIS — M9905 Segmental and somatic dysfunction of pelvic region: Secondary | ICD-10-CM | POA: Diagnosis not present

## 2023-12-27 DIAGNOSIS — M9903 Segmental and somatic dysfunction of lumbar region: Secondary | ICD-10-CM | POA: Diagnosis not present

## 2023-12-28 ENCOUNTER — Ambulatory Visit: Admitting: Family Medicine

## 2023-12-28 ENCOUNTER — Ambulatory Visit: Payer: Self-pay

## 2023-12-28 ENCOUNTER — Encounter: Payer: Self-pay | Admitting: Family Medicine

## 2023-12-28 VITALS — BP 136/97 | HR 83 | Temp 98.5°F | Resp 17 | Ht 62.0 in | Wt 175.2 lb

## 2023-12-28 DIAGNOSIS — K219 Gastro-esophageal reflux disease without esophagitis: Secondary | ICD-10-CM

## 2023-12-28 DIAGNOSIS — R0789 Other chest pain: Secondary | ICD-10-CM | POA: Diagnosis not present

## 2023-12-28 NOTE — Telephone Encounter (Signed)
 Noted

## 2023-12-28 NOTE — Progress Notes (Unsigned)
 Established Patient Office Visit  Subjective    Patient ID: Tracy [name], female    DOB: October 07, 1975  Age: 48 y.o. MRN: [id number]  CC:  Chief Complaint  Patient presents with   Follow-up    Chest pain, pain occasionally when breathing     HPI Tracy [name] presents with intermittent chest pain. Patient reports that sx occur more prominently at night.   Outpatient Encounter Medications as of 12/28/2023  Medication Sig   acetaminophen  (TYLENOL ) 500 MG tablet Take 500 mg by mouth every 6 (six) hours as needed.   Albuterol -Budesonide (AIRSUPRA ) 90-80 MCG/ACT AERO Inhale 2 puffs into the lungs every 4 (four) hours as needed.   amLODipine  (NORVASC ) 10 MG tablet Take 1 tablet by mouth daily.   atorvastatin  (LIPITOR) 20 MG tablet Take 1 tablet by mouth daily.   EPINEPHrine  0.3 mg/0.3 mL IJ SOAJ injection Inject 0.3 mg into the muscle as needed for anaphylaxis.   fexofenadine  (ALLEGRA  ALLERGY ) 180 MG tablet Take 1 tablet (180 mg total) by mouth daily.   fluticasone  (FLONASE ) 50 MCG/ACT nasal spray Place 1 spray into both nostrils daily.   fluticasone -salmeterol (ADVAIR DISKUS) 250-50 MCG/ACT AEPB Inhale 1 puff into the lungs in the morning and at bedtime.   levocetirizine (XYZAL ) 5 MG tablet Take 1 tablet (5 mg total) by mouth daily as needed for allergies (Can take an extra dose during flare ups.).   lisinopril -hydrochlorothiazide  (ZESTORETIC ) 20-12.5 MG tablet Take 1 tablet by mouth daily.   Multiple Vitamin (MULTIVITAMIN WITH MINERALS) TABS tablet Take 1 tablet by mouth daily.   omeprazole  (PRILOSEC) 40 MG capsule Take 1 capsule (40 mg total) by mouth every morning.   Ruxolitinib Phosphate  (OPZELURA ) 1.5 % CREA Apply 1 Application topically 2 (two) times daily as needed.   tacrolimus  (PROTOPIC ) 0.1 % ointment Apply topically 2 (two) times daily.   betamethasone  valerate ointment (VALISONE ) 0.1 % Apply 1 Application topically 2 (two) times daily.   cyclobenzaprine  (FLEXERIL ) 5 MG  tablet Take 1 tablet (5 mg total) by mouth at bedtime as needed for muscle spasms.   meloxicam  (MOBIC ) 15 MG tablet Take 1 tablet (15 mg total) by mouth daily. (Patient not taking: Reported on 12/28/2023)   montelukast  (SINGULAIR ) 10 MG tablet TAKE 1 TABLET BY MOUTH EVERYDAY AT BEDTIME (Patient not taking: Reported on 12/28/2023)   phentermine  37.5 MG capsule Take 1 capsule (37.5 mg total) by mouth every morning.   RYALTRIS  665-25 MCG/ACT SUSP 2 sprays per nostril 1-2 times daily as needed.   No facility-administered encounter medications on file as of 12/28/2023.    Past Medical History:  Diagnosis Date   Asthma    Breast lump    Eczema    Hypertension     Past Surgical History:  Procedure Laterality Date   BREAST LUMPECTOMY WITH RADIOACTIVE SEED LOCALIZATION Left 07/30/2020   Procedure: LEFT BREAST LUMPECTOMY WITH RADIOACTIVE SEED LOCALIZATION;  Surgeon: Vanderbilt Ned, MD;  Location: [facility];  Service: General;  Laterality: Left;   CESAREAN SECTION  1994   CESAREAN SECTION  2001   CESAREAN SECTION  06/19/2008   TONSILLECTOMY  1992   UTERINE FIBROID SURGERY  10/2010    Family History  Problem Relation Age of Onset   Eczema Mother    Asthma Mother    Allergic rhinitis Mother    Hypertension Mother    Diabetes Mother    Eczema Father    Hypertension Father    Diabetes Father  Asthma Brother    Allergic rhinitis Brother    Allergic rhinitis Maternal Grandmother    Asthma Paternal Grandmother    Sleep apnea Neg Hx     Social History   Socioeconomic History   Marital status: Divorced    Spouse name: Not on file   Number of children: Not on file   Years of education: Not on file   Highest education level: Not on file  Occupational History   Not on file  Tobacco Use   Smoking status: Never   Smokeless tobacco: Never  Vaping Use   Vaping status: Never Used  Substance and Sexual Activity   Alcohol use: Yes    Comment: occ   Drug use: No    Sexual activity: Yes    Birth control/protection: None  Other Topics Concern   Not on file  Social History Narrative   Not on file   Social Drivers of Health   Tobacco Use: Low Risk (01/01/2024)   Patient History    Smoking Tobacco Use: Never    Smokeless Tobacco Use: Never    Passive Exposure: Not on file  Financial Resource Strain: Low Risk (06/29/2023)   Overall Financial Resource Strain (CARDIA)    Difficulty of Paying Living Expenses: Not hard at all  Food Insecurity: No Food Insecurity (06/29/2023)   Epic    Worried About Programme Researcher, Broadcasting/film/video in the Last Year: Never true    Ran Out of Food in the Last Year: Never true  Transportation Needs: No Transportation Needs (06/29/2023)   Epic    Lack of Transportation (Medical): No    Lack of Transportation (Non-Medical): No  Physical Activity: Inactive (12/28/2023)   Exercise Vital Sign    Days of Exercise per Week: 0 days    Minutes of Exercise per Session: 0 min  Stress: No Stress Concern Present (06/29/2023)   [employer] of Occupational Health - Occupational Stress Questionnaire    Feeling of Stress: Not at all  Social Connections: Moderately Isolated (06/29/2023)   Social Connection and Isolation Panel    Frequency of Communication with Friends and Family: More than three times a week    Frequency of Social Gatherings with Friends and Family: More than three times a week    Attends Religious Services: 1 to 4 times per year    Active Member of [employer] or Organizations: No    Attends Banker Meetings: Never    Marital Status: Divorced  Catering Manager Violence: Not At Risk (06/29/2023)   Epic    Fear of Current or Ex-Partner: No    Emotionally Abused: No    Physically Abused: No    Sexually Abused: No  Depression (PHQ2-9): Low Risk (08/31/2023)   Depression (PHQ2-9)    PHQ-2 Score: 0  Alcohol Screen: Low Risk (06/29/2023)   Alcohol Screen    Last Alcohol Screening Score (AUDIT): 1  Housing: Unknown  (06/29/2023)   Epic    Unable to Pay for Housing in the Last Year: No    Number of Times Moved in the Last Year: Not on file    Homeless in the Last Year: No  Utilities: Not At Risk (06/29/2023)   Epic    Threatened with loss of utilities: No  Health Literacy: Adequate Health Literacy (06/29/2023)   B1300 Health Literacy    Frequency of need for help with medical instructions: Never    Review of Systems  All other systems reviewed and are negative.  Objective    BP (!) 136/97   Pulse 83   Temp 98.5 F (36.9 C)   Resp 17   Ht 5' 2 (1.575 m)   Wt 175 lb 3.2 oz (79.5 kg)   SpO2 97%   BMI 32.04 kg/m   Physical Exam Vitals and nursing note reviewed.  Constitutional:      General: She is not in acute distress. Cardiovascular:     Rate and Rhythm: Normal rate and regular rhythm.  Pulmonary:     Effort: Pulmonary effort is normal.     Breath sounds: Normal breath sounds.  Abdominal:     Palpations: Abdomen is soft.     Tenderness: There is no abdominal tenderness.  Neurological:     General: No focal deficit present.     Mental Status: She is alert and oriented to person, place, and time.         Assessment & Plan:   Atypical chest pain -     EKG 12-Lead  Gastroesophageal reflux disease without esophagitis  EKG unremarkable. Patient reports eating much that causes GERD sx. Discussed dietary and activity options.    Return in about 3 months (around 03/27/2024) for follow up.   Tanda Raguel SQUIBB, MD

## 2023-12-28 NOTE — Telephone Encounter (Signed)
 FYI Only or Action Required?: FYI only for provider: appointment scheduled on 12/28/23.  Patient was last seen in primary care on 08/31/2023 by Jaycee Greig PARAS, NP.  Called Nurse Triage reporting Chest Pain.  Symptoms began yesterday.  Interventions attempted: Nothing.  Symptoms are: stable.  Triage Disposition: See Physician Within 24 Hours, See PCP When Office is Open (Within 3 Days)  Patient/caregiver understands and will follow disposition?: Yes Reason for Disposition  [1] Chest pain lasting < 5 minutes AND [2] has not taken prescribed nitroglycerin  [1] Chest pain lasts < 5 minutes AND [2] NO chest pain or cardiac symptoms (e.g., breathing difficulty, sweating) now  (Exception: Chest pains that last only a few seconds.)  Answer Assessment - Initial Assessment Questions Patient reports 2 weeks ago was taking OTC cough medication for congestion and post nasal drip. Denies cough, or SOB. Patient know she is prone to bronchitis and pneumonia and wants to be checked out.   1. LOCATION: Where does it hurt?       Front of chest, near right breast  2. RADIATION: Does the pain go anywhere else? (e.g., into neck, jaw, arms, back)     Back  3. ONSET: When did the chest pain begin? (Minutes, hours or days)      Last night  4. PATTERN: Does the pain come and go, or has it been constant since it started?  Does it get worse with exertion?      Only occurs when taking a deep breath, but not every time and notices a slight cough with it.  5. DURATION: How long does it last (e.g., seconds, minutes, hours)     Goes away when exhaling  6. CARDIAC RISK FACTORS: Do you have any history of heart problems or risk factors for heart disease? (e.g., angina, prior heart attack; diabetes, high blood pressure, high cholesterol, smoker, or strong family history of heart disease)     Denies  7.  PULMONARY RISK FACTORS: Do you have any history of lung disease?  (e.g., blood clots in lung,  asthma, emphysema, birth control pills)     Asthma  8. CAUSE: What do you think is causing the chest pain?     Unsure  9. OTHER SYMPTOMS: Do you have any other symptoms? (e.g., dizziness, nausea, vomiting, sweating, fever, difficulty breathing, cough)       Headache behind eyes, mild nasal congestion, a week ago ear was hurting and felt like teeth hurt and that subsided  Protocols used: Chest Pain-A-AH  Copied from CRM #[id number]. Topic: Clinical - Red Word Triage >> Dec 28, 2023 11:08 AM Rosaria BRAVO wrote: Red Word that prompted transfer to Nurse Triage: Chest pain when she takes a deep breath. Concerned of pneumonia, had a bad cough weeks ago. Post nasal drip has went away. Has had a headache during the last few days.   Feels the pain on the inside of the right breast at the chest.

## 2024-01-01 ENCOUNTER — Encounter: Payer: Self-pay | Admitting: Family Medicine

## 2024-01-04 ENCOUNTER — Ambulatory Visit

## 2024-01-04 ENCOUNTER — Ambulatory Visit: Admitting: Allergy & Immunology

## 2024-01-04 ENCOUNTER — Other Ambulatory Visit: Payer: Self-pay

## 2024-01-04 VITALS — BP 102/82 | HR 89 | Temp 98.1°F

## 2024-01-04 DIAGNOSIS — J302 Other seasonal allergic rhinitis: Secondary | ICD-10-CM

## 2024-01-04 DIAGNOSIS — J309 Allergic rhinitis, unspecified: Secondary | ICD-10-CM

## 2024-01-04 DIAGNOSIS — J3089 Other allergic rhinitis: Secondary | ICD-10-CM | POA: Diagnosis not present

## 2024-01-04 DIAGNOSIS — L2089 Other atopic dermatitis: Secondary | ICD-10-CM | POA: Diagnosis not present

## 2024-01-04 DIAGNOSIS — J4540 Moderate persistent asthma, uncomplicated: Secondary | ICD-10-CM

## 2024-01-04 DIAGNOSIS — K219 Gastro-esophageal reflux disease without esophagitis: Secondary | ICD-10-CM | POA: Diagnosis not present

## 2024-01-04 MED ORDER — DOXYCYCLINE HYCLATE 100 MG PO TABS: 100.0000 mg | ORAL_TABLET | Freq: Two times a day (BID) | ORAL | 0 refills | Status: AC

## 2024-01-04 MED ORDER — PREDNISONE 10 MG PO TABS: ORAL_TABLET | ORAL | 0 refills | Status: AC

## 2024-01-04 MED ORDER — OMEPRAZOLE 40 MG PO CPDR: 40.0000 mg | DELAYED_RELEASE_CAPSULE | Freq: Every morning | ORAL | 1 refills | Status: AC

## 2024-01-04 MED ORDER — FLUCONAZOLE 150 MG PO TABS: ORAL_TABLET | ORAL | 0 refills | Status: DC
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
## 2024-01-04 NOTE — Patient Instructions (Addendum)
 1. Moderate persistent asthma, uncomplicated - Lung testing looked excellent today.  - We are going to start you on antibiotics and prednisone  for a sinus infection, os hopefully this will help with your breathing problems.  - Daily controller medication(s): Advair 250/76mcg one puff twice daily - Prior to physical activity: AirSupra  2 puffs 10-15 minutes before physical activity. - Rescue medications: AirSupra  2 puffs every 4-6 hours as needed - Asthma control goals:  * Full participation in all desired activities (may need albuterol  before activity) * Albuterol  use two time or less a week on average (not counting use with activity) * Cough interfering with sleep two time or less a month * Oral steroids no more than once a year * No hospitalizations  2. Perennial and seasonal allergic rhinitis (grasses, trees, indoor molds, dust mite, cat) - I am glad that the allergy  shots are going so well. - Try stopping the Singulair  (montelukast ) and go for one month.  - Continue with: Xyzal  (levocetirizine) 5mg  tablet once daily and Allegra  (fexofenadine ) 180mg  once daily - Continue with: Ryaltris  (olopatadine/mometasone) two sprays per nostril 1-2 times daily as needed - The nose spray contains a nasal steroid AND a nasal antihistamine.  - You can use an extra dose of the antihistamine, if needed, for breakthrough symptoms.  - Consider nasal saline rinses 1-2 times daily to remove allergens from the nasal cavities as well as help with mucous clearance (this is especially helpful to do before the nasal sprays are given)  3. Acute sinusitis  - With your current symptoms and time course, antibiotics are needed: doxycycline  100mg  twice daily for 10 days - We are also going to start you on a prednisone  burst.  - Add on nasal saline spray (i.e., Simply Saline) or nasal saline lavage (i.e., NeilMed) as needed prior to medicated nasal sprays. - For thick post nasal drainage, add guaifenesin  [phone] mg  (Mucinex ) twice daily as needed for mucous thinning with adequate hydration to help it work.   3. Recurrent infections - You had an excellent response to the Pneumovax.    4. GERD - Continue with omeprazole  40mg  once daily.  5. Return in about 6 months (around 07/04/2024). You can have the follow up appointment with Dr. Iva or a Nurse Practicioner (our Nurse Practitioners are excellent and always have Physician oversight!).    Please inform us  of any Emergency Department visits, hospitalizations, or changes in symptoms. Call us  before going to the ED for breathing or allergy  symptoms since we might be able to fit you in for a sick visit. Feel free to contact us  anytime with any questions, problems, or concerns.  It was a pleasure to see you again today!  Websites that have reliable patient information: 1. American Academy of Asthma, Allergy , and Immunology: www.aaaai.org 2. Food Allergy  Research and Education (FARE): foodallergy.org 3. Mothers of Asthmatics: http://www.asthmacommunitynetwork.org 4. [employer] of Allergy , Asthma, and Immunology: www.acaai.org      Like us  on [employer] and Instagram for our latest updates!      A healthy democracy works best when [employer] participate! Make sure you are registered to vote! If you have moved or changed any of your contact information, you will need to get this updated before voting! Scan the QR codes below to learn more!

## 2024-01-08 ENCOUNTER — Other Ambulatory Visit: Payer: Self-pay | Admitting: Allergy & Immunology

## 2024-01-15 ENCOUNTER — Ambulatory Visit

## 2024-01-15 DIAGNOSIS — J309 Allergic rhinitis, unspecified: Secondary | ICD-10-CM | POA: Diagnosis not present

## 2024-01-15 DIAGNOSIS — M7918 Myalgia, other site: Secondary | ICD-10-CM | POA: Diagnosis not present

## 2024-01-15 DIAGNOSIS — M5416 Radiculopathy, lumbar region: Secondary | ICD-10-CM | POA: Diagnosis not present

## 2024-01-25 ENCOUNTER — Ambulatory Visit (INDEPENDENT_AMBULATORY_CARE_PROVIDER_SITE_OTHER)

## 2024-01-25 DIAGNOSIS — J302 Other seasonal allergic rhinitis: Secondary | ICD-10-CM | POA: Diagnosis not present

## 2024-03-05 ENCOUNTER — Ambulatory Visit: Payer: Self-pay | Admitting: Family

## 2024-07-04 ENCOUNTER — Ambulatory Visit: Admitting: Allergy & Immunology

## 2024-07-25 ENCOUNTER — Encounter: Admitting: Family
